# Patient Record
Sex: Female | Born: 1965
Health system: Southern US, Community
[De-identification: ages and names within clinical notes are randomized; demographics above are authoritative.]

## PROBLEM LIST (undated history)

## (undated) DIAGNOSIS — B009 Herpesviral infection, unspecified: Secondary | ICD-10-CM

## (undated) DIAGNOSIS — F1920 Other psychoactive substance dependence, uncomplicated: Secondary | ICD-10-CM

## (undated) DIAGNOSIS — K56609 Unspecified intestinal obstruction, unspecified as to partial versus complete obstruction: Secondary | ICD-10-CM

## (undated) DIAGNOSIS — I10 Essential (primary) hypertension: Secondary | ICD-10-CM

## (undated) DIAGNOSIS — J449 Chronic obstructive pulmonary disease, unspecified: Secondary | ICD-10-CM

## (undated) DIAGNOSIS — K1379 Other lesions of oral mucosa: Secondary | ICD-10-CM

## (undated) DIAGNOSIS — K219 Gastro-esophageal reflux disease without esophagitis: Secondary | ICD-10-CM

## (undated) DIAGNOSIS — E785 Hyperlipidemia, unspecified: Secondary | ICD-10-CM

## (undated) DIAGNOSIS — B1920 Unspecified viral hepatitis C without hepatic coma: Secondary | ICD-10-CM

## (undated) DIAGNOSIS — K5900 Constipation, unspecified: Secondary | ICD-10-CM

## (undated) HISTORY — DX: Other lesions of oral mucosa: K13.79

## (undated) HISTORY — DX: Unspecified viral hepatitis C without hepatic coma: B19.20

## (undated) HISTORY — DX: Other psychoactive substance dependence, uncomplicated: F19.20

## (undated) HISTORY — PX: ABDOMINAL HYSTERECTOMY: SHX81

## (undated) HISTORY — PX: HERNIA REPAIR: SHX51

## (undated) HISTORY — DX: Herpesviral infection, unspecified: B00.9

## (undated) HISTORY — DX: Hyperlipidemia, unspecified: E78.5

## (undated) HISTORY — PX: CHOLECYSTECTOMY: SHX55

---

## 2005-03-13 ENCOUNTER — Ambulatory Visit: Payer: Self-pay | Admitting: Surgery

## 2005-04-02 ENCOUNTER — Ambulatory Visit: Payer: Self-pay | Admitting: Surgery

## 2005-06-11 ENCOUNTER — Emergency Department: Payer: Self-pay | Admitting: Emergency Medicine

## 2005-06-20 ENCOUNTER — Ambulatory Visit: Payer: Self-pay | Admitting: Gastroenterology

## 2005-06-23 ENCOUNTER — Emergency Department: Payer: Self-pay | Admitting: General Practice

## 2005-08-06 ENCOUNTER — Ambulatory Visit: Payer: Self-pay | Admitting: Surgery

## 2005-09-08 ENCOUNTER — Other Ambulatory Visit: Payer: Self-pay

## 2005-09-18 ENCOUNTER — Inpatient Hospital Stay: Payer: Self-pay | Admitting: Surgery

## 2005-10-28 ENCOUNTER — Emergency Department (HOSPITAL_COMMUNITY): Admission: EM | Admit: 2005-10-28 | Discharge: 2005-10-28 | Payer: Self-pay | Admitting: Emergency Medicine

## 2005-11-11 ENCOUNTER — Emergency Department (HOSPITAL_COMMUNITY): Admission: EM | Admit: 2005-11-11 | Discharge: 2005-11-11 | Payer: Self-pay | Admitting: Family Medicine

## 2006-01-29 ENCOUNTER — Emergency Department: Payer: Self-pay | Admitting: Emergency Medicine

## 2006-04-30 ENCOUNTER — Emergency Department: Payer: Self-pay | Admitting: Emergency Medicine

## 2006-10-19 ENCOUNTER — Emergency Department: Payer: Self-pay | Admitting: Unknown Physician Specialty

## 2006-10-19 ENCOUNTER — Other Ambulatory Visit: Payer: Self-pay

## 2006-12-14 IMAGING — CR DG ANKLE COMPLETE 3+V*L*
2 series · 2 of 2 positions shown · non-contrast
Comparison: none

CLINICAL DATA: Ankle pain and swelling.  
 LEFT ANKLE ? 3 VIEW:
 There is soft tissue swelling noted.  There are no fractures or dislocations.  There is a compression plate/screw seen associated with the 1st metatarsal shaft.

[view not recorded (1 of 2)]
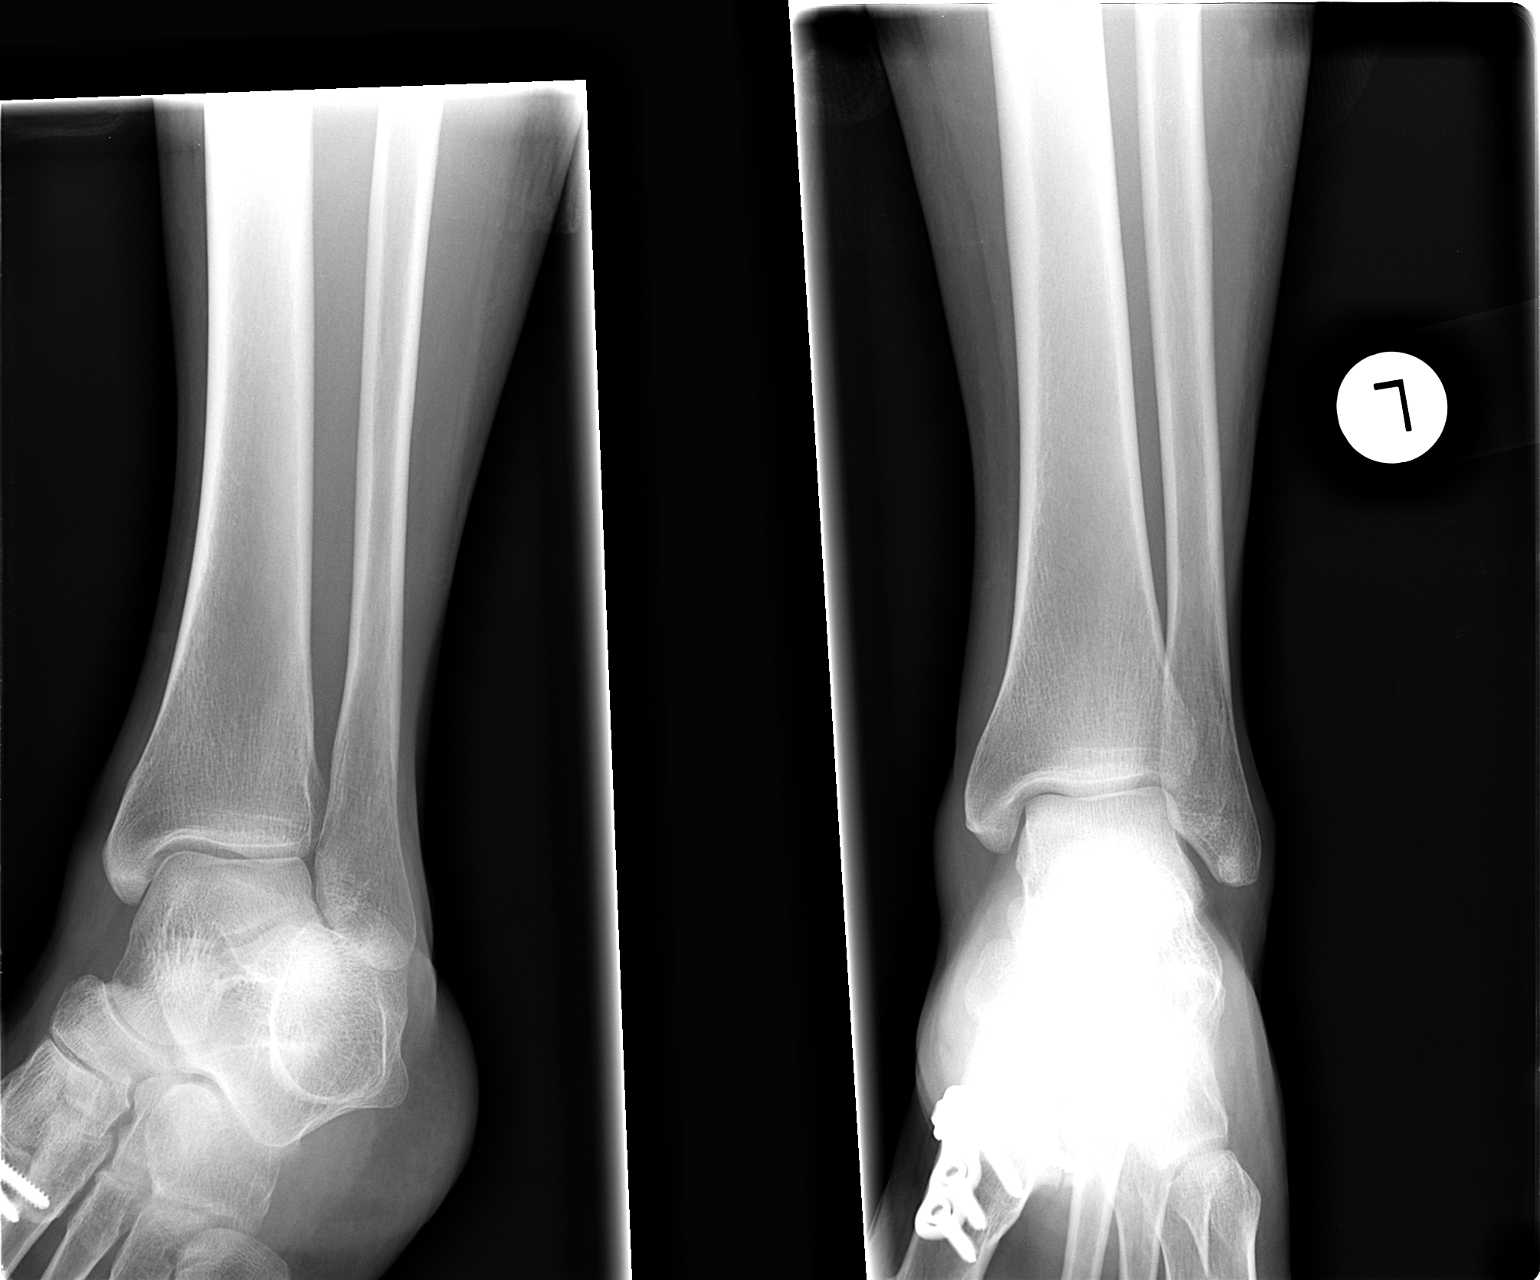

[view not recorded (2 of 2)]
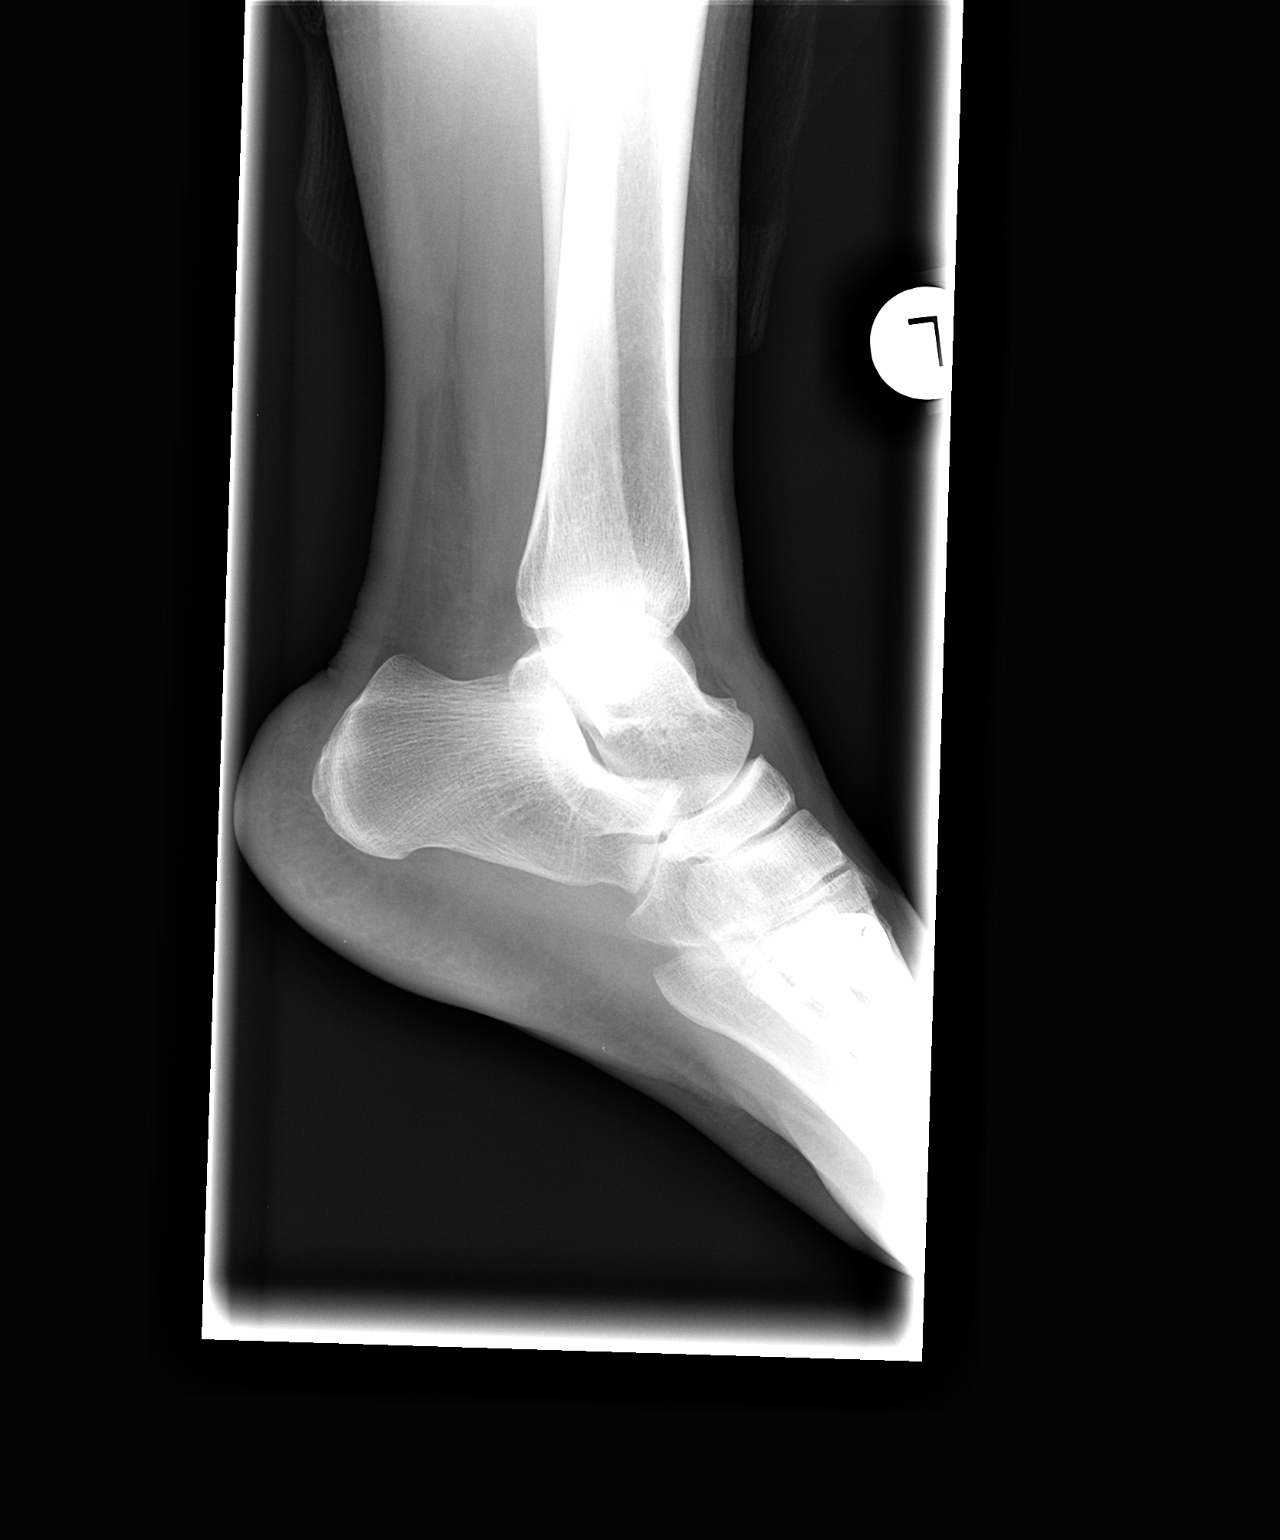

[2 of 2 positions shown; findings below may reference images not displayed]

IMPRESSION: Soft tissue swelling.  No acute findings.

## 2010-07-03 ENCOUNTER — Emergency Department: Payer: Self-pay | Admitting: Emergency Medicine

## 2011-03-25 ENCOUNTER — Inpatient Hospital Stay (INDEPENDENT_AMBULATORY_CARE_PROVIDER_SITE_OTHER)
Admission: RE | Admit: 2011-03-25 | Discharge: 2011-03-25 | Disposition: A | Discharge status: Home / Self Care | Payer: Medicare Other | Source: Ambulatory Visit | Attending: Family Medicine | Admitting: Family Medicine

## 2011-03-25 DIAGNOSIS — J45909 Unspecified asthma, uncomplicated: Secondary | ICD-10-CM

## 2011-04-16 DIAGNOSIS — E782 Mixed hyperlipidemia: Secondary | ICD-10-CM | POA: Insufficient documentation

## 2011-06-20 ENCOUNTER — Other Ambulatory Visit: Payer: Self-pay | Admitting: Nurse Practitioner

## 2011-06-20 ENCOUNTER — Ambulatory Visit
Admission: RE | Admit: 2011-06-20 | Discharge: 2011-06-20 | Disposition: A | Discharge status: Home / Self Care | Payer: Medicare Other | Source: Ambulatory Visit | Attending: Nurse Practitioner | Admitting: Nurse Practitioner

## 2011-06-20 DIAGNOSIS — Z1231 Encounter for screening mammogram for malignant neoplasm of breast: Secondary | ICD-10-CM

## 2011-07-04 ENCOUNTER — Encounter: Payer: Self-pay | Admitting: Internal Medicine

## 2011-07-07 ENCOUNTER — Encounter: Payer: Self-pay | Admitting: Internal Medicine

## 2011-07-07 ENCOUNTER — Ambulatory Visit (INDEPENDENT_AMBULATORY_CARE_PROVIDER_SITE_OTHER): Payer: Medicare Other | Admitting: Internal Medicine

## 2011-07-07 DIAGNOSIS — J45909 Unspecified asthma, uncomplicated: Secondary | ICD-10-CM

## 2011-07-07 MED ORDER — FAMOTIDINE 20 MG PO TABS: ORAL_TABLET | ORAL | Status: DC

## 2011-07-07 MED ORDER — BUDESONIDE-FORMOTEROL FUMARATE 160-4.5 MCG/ACT IN AERO: INHALATION_SPRAY | RESPIRATORY_TRACT | Status: DC

## 2011-07-07 MED ORDER — RABEPRAZOLE SODIUM 20 MG PO TBEC: DELAYED_RELEASE_TABLET | ORAL | Status: DC

## 2011-07-07 NOTE — Progress Notes (Signed)
  Subjective:    Patient ID: Brittany Sparks, female    DOB: 12/19/1965, 45 y.o.   MRN: [id number]  HPI  45 yobm quit smoking 2008 p dx of asthma by [facility] around 2000 referred 07/07/2011 to  Pulmonary clinic by [name].  07/07/2011 1st pulmonary cc cough x 12 years, daily no treatment for asthma has helped the cough including advair then abupt onset cough and sob much worse   x 3 weeks changed bp meds x 2 weeks ? Was this an ace inhibitor? - cough is dry more day than night but present 24 h per day, minimal sob, controls with saba but takes up to 10 puffs at a time before improves.   Denies any obvious fluctuation of symptoms with weather or environmental changes or other aggravating or alleviating factors except as outlined above   Review of Systems  Constitutional: Negative for fever, chills and unexpected weight change.  HENT: Negative for ear pain, nosebleeds, congestion, sore throat, rhinorrhea, sneezing, trouble swallowing, dental problem, voice change, postnasal drip and sinus pressure.   Eyes: Negative for visual disturbance.  Respiratory: Negative for cough, choking and shortness of breath.   Cardiovascular: Negative for chest pain and leg swelling.  Gastrointestinal: Negative for vomiting, abdominal pain and diarrhea.  Genitourinary: Negative for difficulty urinating.  Musculoskeletal: Negative for arthralgias.  Skin: Negative for rash.  Neurological: Negative for tremors, syncope and headaches.  Hematological: Does not bruise/bleed easily.       Objective:   Physical Exam  slt hoarse amb bf nad   Wt 149 07/07/2011   HEENT: nl dentition, turbinates, and orophanx. Nl external ear canals without cough reflex   NECK :  without JVD/Nodes/TM/ nl carotid upstrokes bilaterally   LUNGS: no acc muscle use, clear to A and P bilaterally without cough on insp or exp maneuvers   CV:  RRR  no s3 or murmur or increase in P2, no edema   ABD:  soft and nontender  with nl excursion in the supine position. No bruits or organomegaly, bowel sounds nl  MS:  warm without deformities, calf tenderness, cyanosis or clubbing  SKIN: warm and dry without lesions    NEURO:  alert, approp, no deficits    06/20/11 CXR at [facility] reported nl       Assessment & Plan:

## 2011-07-07 NOTE — Patient Instructions (Signed)
Try aciphex  20mg   Take 30-60 min before first meal of the day and Pepcid 20 mg one bedtime until cough is completely gone for at least a week without the need for cough suppression  I think of reflux for chronic cough like I do oxygen for fire (doesn't cause the fire but once you get the oxygen suppressed it usually goes away regardless of the exact cause).   symbicort 160 Take 2 puffs first thing in am and then another 2 puffs about 12 hours later.     Work on inhaler technique:  relax and gently blow all the way out then take a nice smooth deep breath back in, triggering the inhaler at same time you start breathing in.  Hold for up to 5 seconds if you can.  Rinse and gargle with water when done   If your mouth or throat starts to bother you,   I suggest you time the inhaler to your dental care and after using the inhaler(s) brush teeth and tongue with a baking soda containing toothpaste and when you rinse this out, gargle with it first to see if this helps your mouth and throat.    Only use your albuterol(ventolin, blue inhaler) as a rescue medication to be used if you can't catch your breath by resting or doing a relaxed purse lip breathing pattern. The less you use it, the better it will work when you need it.   Best cough medication is delsym   Please schedule a follow up office visit in 4 weeks, sooner if needed

## 2011-07-07 NOTE — Progress Notes (Signed)
  Subjective:    Patient ID: Brittany Sparks, female    DOB: 11-Mar-1966, 45 y.o.   MRN: [id number]  HPI    Review of Systems  Constitutional: Negative for fever, chills, diaphoresis, activity change, appetite change, fatigue and unexpected weight change.  HENT: Positive for mouth sores, postnasal drip and sinus pressure. Negative for hearing loss, ear pain, nosebleeds, congestion, sore throat, facial swelling, rhinorrhea, sneezing, trouble swallowing, neck pain, neck stiffness, dental problem, voice change, tinnitus and ear discharge.   Eyes: Negative for photophobia, discharge, itching and visual disturbance.  Respiratory: Positive for cough, shortness of breath and wheezing. Negative for apnea, choking, chest tightness and stridor.   Cardiovascular: Negative for chest pain, palpitations and leg swelling.  Gastrointestinal: Negative for nausea, vomiting, abdominal pain, constipation, blood in stool and abdominal distention.  Genitourinary: Negative for dysuria, urgency, frequency, hematuria, flank pain, decreased urine volume and difficulty urinating.  Musculoskeletal: Negative for myalgias, back pain, joint swelling, arthralgias and gait problem.  Skin: Negative for color change, pallor and rash.  Neurological: Positive for dizziness, weakness and light-headedness. Negative for tremors, seizures, syncope, speech difficulty, numbness and headaches.  Hematological: Negative for adenopathy. Does not bruise/bleed easily.  Psychiatric/Behavioral: Positive for sleep disturbance. Negative for confusion and agitation. The patient is not nervous/anxious.        Objective:   Physical Exam        Assessment & Plan:

## 2011-07-08 DIAGNOSIS — J449 Chronic obstructive pulmonary disease, unspecified: Secondary | ICD-10-CM | POA: Insufficient documentation

## 2011-07-08 NOTE — Assessment & Plan Note (Signed)
DDX of  difficult airways managment all start with A and  include Adherence, Ace Inhibitors, Acid Reflux, Active Sinus Disease, Alpha 1 Antitripsin deficiency, Anxiety masquerading as Airways dz,  ABPA,  allergy(esp in young), Aspiration (esp in elderly), Adverse effects of DPI,  Active smokers, plus two Bs  = Bronchiectasis and Beta blocker use..and one C= CHF  In this case Adherence is the biggest issue and starts with  inability to use HFA effectively and also  understand that SABA treats the symptoms but doesn't get to the underlying problem (inflammation).  I used  the analogy of putting steroid cream on a rash to help explain the meaning of topical therapy and the need to get the drug to the target tissue.  The proper method of use, as well as anticipated side effects, of this metered-dose inhaler are discussed and demonstrated to the patient. Improved to 75% p extensive teaching so start symbicort 160 2bid  ?Adverse effect of advair > d/c  ? Acid reflux > diet reviewed

## 2011-08-11 ENCOUNTER — Encounter: Payer: Self-pay | Admitting: Internal Medicine

## 2011-08-11 ENCOUNTER — Ambulatory Visit (INDEPENDENT_AMBULATORY_CARE_PROVIDER_SITE_OTHER): Payer: Medicare Other | Admitting: Internal Medicine

## 2011-08-11 DIAGNOSIS — J45909 Unspecified asthma, uncomplicated: Secondary | ICD-10-CM

## 2011-08-11 MED ORDER — PREDNISONE (PAK) 10 MG PO TABS: ORAL_TABLET | ORAL | Status: AC

## 2011-08-11 MED ORDER — AMOXICILLIN-POT CLAVULANATE 875-125 MG PO TABS: 1.0000 | ORAL_TABLET | Freq: Two times a day (BID) | ORAL | Status: AC

## 2011-08-11 MED ORDER — AMLODIPINE-OLMESARTAN 5-20 MG PO TABS: 1.0000 | ORAL_TABLET | Freq: Every day | ORAL | Status: DC

## 2011-08-11 NOTE — Patient Instructions (Addendum)
Stop norvasc and cozar Start Azor 5/20 one now and each am Prednisone 10 mg take  4 each am x 2 days,   2 each am x 2 days,  1 each am x2days and stop  Augmentin twice daily with meals and a large glass of water and cultured yogurt for lunch   Only use your albuterol (ventolin)  as a rescue medication to be used if you can't catch your breath by resting or doing a relaxed purse lip breathing pattern. The less you use it, the better it will work when you need it.    See [name] NP w/in 2 weeks with all your medications, even over the counter meds, separated in two separate bags, the ones you take no matter what vs the ones you stop once you feel better and take only as needed when you feel you need them.   [name]  will generate for you a new user friendly medication calendar that will put [place] all on the same page re: your medication use.   BRING YOUR FORMULARY    Without this process, it simply isn't possible to assure that we are providing  your outpatient care  with  the attention to detail we feel you deserve.   If we cannot assure that you're getting that kind of care,  then we cannot manage your problem effectively from this clinic.  Once you have seen [name] and we are sure that we're all on the same page with your medication use she will arrange follow up with me.

## 2011-08-11 NOTE — Progress Notes (Signed)
  Subjective:    Patient ID: Brittany Sparks, female    DOB: 12/17/1965  MRN: [id number]  HPI  Brief patient profile:  45 yobm quit smoking 2008 p dx of asthma by Brittany Sparks around 2000 referred 07/07/2011 to  [facility] by Brittany [facility].  07/07/2011 1st [facility] cc cough x 12 years, daily no treatment for asthma has helped the cough including advair then abupt onset cough and sob much worse   x 3 weeks changed bp meds x 2 weeks ? Was this an ace inhibitor? - cough is dry more day than night but present 24 h per day, minimal sob, controls with saba but takes up to 10 puffs at a time before improves. rec Try aciphex  20mg   Take 30-60 min before first meal of the day and Pepcid 20 mg one bedtime  Symbicort 160 Take 2 puffs first thing in am and then another 2 puffs about 12 hours later.  Work on inhaler technique:  Only use your albuterol(ventolin, blue inhaler) as a rescue medication .  Best cough medication is delsym   08/11/2011 f/u ov/Brittany Sparks on prevacid 30 mg and pepcid one at bedtime Cough improved but worsened again x 1 wk prior to OV   Shortness of Breath improved. Still using ventolin day but not typically at night.  Inconsistent with meds, unsure of names, does not really understand maint vs prns and stops them when she feels better or rx runs out.   Sleeping ok without nocturnal  or early am exacerbation  of respiratory  c/o's or need for noct saba. Also denies any obvious fluctuation of symptoms with weather or environmental changes or other aggravating or alleviating factors except as outlined above   ROS  At present neg for  any significant sore throat, dysphagia, itching, sneezing,  nasal congestion or excess/ purulent secretions,  fever, chills, sweats, unintended wt loss, pleuritic or exertional cp, hempoptysis, orthopnea pnd or leg swelling.  Also denies presyncope, palpitations, heartburn, abdominal pain, nausea, vomiting, diarrhea  or change in bowel or urinary habits,  dysuria,hematuria,  rash, arthralgias, visual complaints, headache, numbness weakness or ataxia.          Objective:   Physical Exam  slt hoarse amb bf nad   Wt 149 07/07/2011 > 08/11/2011  153  HEENT: nl dentition, turbinates, and orophanx. Nl external ear canals without cough reflex   NECK :  without JVD/Nodes/TM/ nl carotid upstrokes bilaterally   LUNGS: no acc muscle use, clear to A and P bilaterally without cough on insp or exp maneuvers   CV:  RRR  no s3 or murmur or increase in P2, no edema   ABD:  soft and nontender with nl excursion in the supine position. No bruits or organomegaly, bowel sounds nl  MS:  warm without deformities, calf tenderness, cyanosis or clubbing     06/20/11 CXR at [facility] reported nl       Assessment & Plan:

## 2011-08-12 NOTE — Assessment & Plan Note (Signed)
Symptoms are markedly disproportionate to objective findings and not clear this is all a  lung problem but pt does appear to have difficult airway management issues.   Adherence is always the initial "prime suspect" and is a multilayered concern that requires a "trust but verify" approach in every patient - starting with knowing how to use medications, especially inhalers, correctly, keeping up with refills and understanding the fundamental difference between maintenance and prns vs those medications only taken for a very short course and then stopped and not refilled. The proper method of use, as well as anticipated side effects, of this metered-dose inhaler are discussed and demonstrated to the patient. Improved to 100% with repeated teaching attempts  ? Acid reflux > reviewed rx/ diet  ? Sinus dz > augmentin/ pred cycle then sinus ct if not better  ? Surreptitious acei use > desperately needs full and accurate med reconciliatoin    Each maintenance medication was reviewed in detail including most importantly the difference between maintenance and as needed and under what circumstances the prns are to be used.  Please see instructions for details which were reviewed in writing and the patient given a copy.

## 2011-08-25 ENCOUNTER — Encounter: Payer: Self-pay | Admitting: Adult Health

## 2011-08-25 ENCOUNTER — Ambulatory Visit (INDEPENDENT_AMBULATORY_CARE_PROVIDER_SITE_OTHER): Payer: Medicare Other | Admitting: Adult Health

## 2011-08-25 DIAGNOSIS — J45909 Unspecified asthma, uncomplicated: Secondary | ICD-10-CM

## 2011-08-25 MED ORDER — BUDESONIDE-FORMOTEROL FUMARATE 160-4.5 MCG/ACT IN AERO: INHALATION_SPRAY | RESPIRATORY_TRACT | Status: DC

## 2011-08-25 MED ORDER — AMLODIPINE-OLMESARTAN 5-20 MG PO TABS: 1.0000 | ORAL_TABLET | Freq: Every day | ORAL | Status: DC

## 2011-08-25 MED ORDER — SIMVASTATIN 10 MG PO TABS: 10.0000 mg | ORAL_TABLET | Freq: Every day | ORAL | Status: DC

## 2011-08-25 MED ORDER — ALBUTEROL SULFATE HFA 108 (90 BASE) MCG/ACT IN AERS: 2.0000 | INHALATION_SPRAY | RESPIRATORY_TRACT | Status: DC | PRN

## 2011-08-25 MED ORDER — FLUTICASONE PROPIONATE 50 MCG/ACT NA SUSP: 2.0000 | Freq: Two times a day (BID) | NASAL | Status: DC | PRN

## 2011-08-25 MED ORDER — BUTALBITAL-APAP-CAFFEINE 50-325-40 MG PO TABS: 1.0000 | ORAL_TABLET | Freq: Four times a day (QID) | ORAL | Status: DC | PRN

## 2011-08-25 MED ORDER — OMEPRAZOLE 40 MG PO CPDR: DELAYED_RELEASE_CAPSULE | ORAL | Status: DC

## 2011-08-25 NOTE — Assessment & Plan Note (Addendum)
Improved control  Patient's medications were reviewed today and patient education was given. Computerized medication calendar was adjusted/completed  Plan:  Cont on current regimen.  follow up Dr. Sherene Sires  In 1 month and As needed

## 2011-08-25 NOTE — Patient Instructions (Signed)
Stop Goody powder/meds  Follow med calendar closely and bring to each visit.  Brush/rinse and gargle after inhaler use  follow up Dr. Sherene Sires  In 1 month and As needed

## 2011-08-25 NOTE — Progress Notes (Signed)
Patient ID: Brittany Sparks, female   DOB: 01/25/1966, 46 y.o.   MRN: [id number]  Subjective:    Patient ID: Brittany Sparks, female    DOB: 1966-02-26  MRN: [id number]  HPI  Brief patient profile:  45 yobm quit smoking 2008 p dx of asthma by [facility] around 2000 referred 07/07/2011 to  Pulmonary clinic by [name].  07/07/2011 1st pulmonary cc cough x 12 years, daily no treatment for asthma has helped the cough including advair then abupt onset cough and sob much worse   x 3 weeks changed bp meds x 2 weeks ? Was this an ace inhibitor? - cough is dry more day than night but present 24 h per day, minimal sob, controls with saba but takes up to 10 puffs at a time before improves. rec Try aciphex  20mg   Take 30-60 min before first meal of the day and Pepcid 20 mg one bedtime  Symbicort 160 Take 2 puffs first thing in am and then another 2 puffs about 12 hours later.  Work on inhaler technique:  Only use your albuterol(ventolin, blue inhaler) as a rescue medication .  Best cough medication is delsym   08/11/2011 f/u ov/Wert on prevacid 30 mg and pepcid one at bedtime Cough improved but worsened again x 1 wk prior to OV   Shortness of Breath improved. Still using ventolin day but not typically at night.  Inconsistent with meds, unsure of names, does not really understand maint vs prns and stops them when she feels better or rx runs out.  >>prednisone taper , changed to AZOR from cozaar/norvasc ,augmentin r x  08/25/2011 Follow up and med review  Pt returns for follow up and med review. Since last ov feeling much better.  We reviewed all her meds and organized them into a med calendar  W/ pt education  It appears she is taking meds correctly.  No chest pain or edema. Dyspnea is less.            Objective:   Physical Exam  slt hoarse amb bf nad   Wt 149 07/07/2011 > 08/11/2011  153>>151 08/25/2011  HEENT: nl dentition, turbinates, and orophanx. Nl external ear canals without cough  reflex   NECK :  without JVD/Nodes/TM/ nl carotid upstrokes bilaterally   LUNGS: no acc muscle use, clear to A and P bilaterally without cough on insp or exp maneuvers   CV:  RRR  no s3 or murmur or increase in P2, no edema   ABD:  soft and nontender with nl excursion in the supine position. No bruits or organomegaly, bowel sounds nl  MS:  warm without deformities, calf tenderness, cyanosis or clubbing     06/20/11 CXR at [facility] reported nl       Assessment & Plan:

## 2011-09-22 ENCOUNTER — Encounter: Payer: Medicare Other | Admitting: Internal Medicine

## 2011-09-22 NOTE — Progress Notes (Signed)
 This encounter was created in error - please disregard.

## 2011-10-10 ENCOUNTER — Ambulatory Visit (INDEPENDENT_AMBULATORY_CARE_PROVIDER_SITE_OTHER): Payer: Medicare Other | Admitting: Internal Medicine

## 2011-10-10 ENCOUNTER — Encounter: Payer: Self-pay | Admitting: Internal Medicine

## 2011-10-10 DIAGNOSIS — J45909 Unspecified asthma, uncomplicated: Secondary | ICD-10-CM

## 2011-10-10 NOTE — Patient Instructions (Signed)
Try substituting the dulera 200 Take 2 puffs first thing in am and then another 2 puffs about 12 hours later.   Dexilant 60 Take 30-60 min before first meal of the day while waiting to sort out the omeprazole  Issue  Please schedule a follow up office visit in 4 weeks, sooner if needed

## 2011-10-10 NOTE — Progress Notes (Signed)
Patient ID: Brittany Sparks, female   DOB: 1965/09/15, 46 y.o.   MRN: [id number]  Subjective:    Patient ID: Brittany Sparks, female    DOB: 01-06-1966  MRN: [id number]  HPI  Brief patient profile:  45 yobm quit smoking 2008 p dx of asthma by [facility] around 2000 referred 07/07/2011 to  Pulmonary clinic by [name].  07/07/2011 1st pulmonary cc cough x 12 years, daily no treatment for asthma has helped the cough including advair then abupt onset cough and sob much worse   x 3 weeks changed bp meds x 2 weeks ? Was this an ace inhibitor? - cough is dry more day than night but present 24 h per day, minimal sob, controls with saba but takes up to 10 puffs at a time before improves. rec Try aciphex  20mg   Take 30-60 min before first meal of the day and Pepcid 20 mg one bedtime  Symbicort 160 Take 2 puffs first thing in am and then another 2 puffs about 12 hours later.  Work on inhaler technique:  Only use your albuterol(ventolin, blue inhaler) as a rescue medication .  Best cough medication is delsym   08/11/2011 f/u ov/Janete Quilling on prevacid 30 mg and pepcid one at bedtime Cough improved but worsened again x 1 wk prior to OV   Shortness of Breath improved. Still using ventolin day but not typically at night.  Inconsistent with meds, unsure of names, does not really understand maint vs prns and stops them when she feels better or rx runs out.  >>prednisone taper , changed to AZOR from cozaar/norvasc ,augmentin rx  08/25/2011 Follow up and med review  Pt returns for follow up and med review. Since last ov feeling much better.  We reviewed all her meds and organized them into a med calendar  W/ pt education  It appears she is taking meds correctly.  No chest pain or edema. Dyspnea is less.  rec Stop Goody powder/meds  Follow med calendar closely and bring to each visit.  Brush/rinse and gargle after inhaler use  10/10/2011 f/u ov/Ijeoma Loor cc overall better but still using ventolin 3-4 x week esp  with activity due to sob, no purulent sputum or over sinus or reflux symptoms  Sleeping ok without nocturnal  or early am exacerbation  of respiratory  c/o's or need for noct saba. Also denies any obvious fluctuation of symptoms with weather or environmental changes or other aggravating or alleviating factors except as outlined above   ROS  At present neg for  any significant sore throat, dysphagia, dental problems, itching, sneezing,  nasal congestion or excess/ purulent secretions, ear ache,   fever, chills, sweats, unintended wt loss, pleuritic or exertional cp, hemoptysis, palpitations, orthopnea pnd or leg swelling.  Also denies presyncope, palpitations, heartburn, abdominal pain, anorexia, nausea, vomiting, diarrhea  or change in bowel or urinary habits, change in stools or urine, dysuria,hematuria,  rash, arthralgias, visual complaints, headache, numbness weakness or ataxia or problems with walking or coordination. No noted change in mood/affect or memory.                            Objective:   Physical Exam  slt hoarse amb bf nad   Wt 149 07/07/2011 > 08/11/2011  153>>151 08/25/2011 >  148 10/10/2011    HEENT: nl dentition, turbinates, and orophanx. Nl external ear canals without cough reflex   NECK :  without JVD/Nodes/TM/ nl carotid upstrokes  bilaterally   LUNGS: no acc muscle use, clear to A and P bilaterally without cough on insp or exp maneuvers   CV:  RRR  no s3 or murmur or increase in P2, no edema   ABD:  soft and nontender with nl excursion in the supine position. No bruits or organomegaly, bowel sounds nl  MS:  warm without deformities, calf tenderness, cyanosis or clubbing     06/20/11 CXR at [facility] reported nl       Assessment & Plan:

## 2011-10-11 NOTE — Assessment & Plan Note (Signed)
-   hfa improved to 75% 07/07/11 > 100% 08/11/2011  -med calendar 08/25/2011   Still using excessive B2 but overally much better symptom control with use of hfa laba/ics and gerd rx  Try next change to dulera 200 2bid and dexilant samples to max gerd rx      Each maintenance medication was reviewed in detail including most importantly the difference between maintenance and as needed and under what circumstances the prns are to be used. This was done in the context of a medication calendar review which provided the patient with a user-friendly unambiguous mechanism for medication administration and reconciliation and provides an action plan for all active problems. It is critical that this be shown to every doctor  for modification during the office visit if necessary so the patient can use it as a working document.

## 2011-10-22 ENCOUNTER — Telehealth: Payer: Self-pay | Admitting: Internal Medicine

## 2011-10-22 NOTE — Telephone Encounter (Signed)
[id number] to verify pharmacy. Carron Curie, CMA

## 2011-10-22 NOTE — Telephone Encounter (Signed)
Ok to send rx

## 2011-10-22 NOTE — Telephone Encounter (Signed)
MW, please advise if okay to send RX; was seen 10-10-2011 and was told---Dexilant 60 Take 30-60 min before first meal of the day while waiting to sort out the omeprazole Issue

## 2011-10-23 MED ORDER — DEXLANSOPRAZOLE 60 MG PO CPDR: 60.0000 mg | DELAYED_RELEASE_CAPSULE | Freq: Every day | ORAL | Status: DC

## 2011-10-23 NOTE — Telephone Encounter (Signed)
LMOMTCB x 1 

## 2011-10-23 NOTE — Telephone Encounter (Signed)
RX sent to the pharmacy and detailed msg left on pt's VM so she is aware.

## 2011-10-23 NOTE — Telephone Encounter (Signed)
Pt returned triage's call.  The pharmacy the pt would like the Rx sent to is [facility] on [address] in [place].  Brittany Sparks

## 2011-11-07 ENCOUNTER — Encounter: Payer: Self-pay | Admitting: Internal Medicine

## 2011-11-07 ENCOUNTER — Ambulatory Visit (INDEPENDENT_AMBULATORY_CARE_PROVIDER_SITE_OTHER): Payer: Medicare Other | Admitting: Internal Medicine

## 2011-11-07 DIAGNOSIS — J45909 Unspecified asthma, uncomplicated: Secondary | ICD-10-CM

## 2011-11-07 DIAGNOSIS — I10 Essential (primary) hypertension: Secondary | ICD-10-CM

## 2011-11-07 DIAGNOSIS — J310 Chronic rhinitis: Secondary | ICD-10-CM | POA: Insufficient documentation

## 2011-11-07 MED ORDER — AMLODIPINE-OLMESARTAN 5-20 MG PO TABS: 1.0000 | ORAL_TABLET | Freq: Every day | ORAL | Status: DC

## 2011-11-07 MED ORDER — MOMETASONE FURO-FORMOTEROL FUM 200-5 MCG/ACT IN AERO: 2.0000 | INHALATION_SPRAY | Freq: Two times a day (BID) | RESPIRATORY_TRACT | Status: DC

## 2011-11-07 MED ORDER — FLUTICASONE PROPIONATE 50 MCG/ACT NA SUSP: 1.0000 | Freq: Every day | NASAL | Status: DC

## 2011-11-07 NOTE — Progress Notes (Signed)
Subjective:    Patient ID: Brittany Sparks, female    DOB: 1966/05/03  MRN: [id number]  HPI  Brief patient profile:  45 yobm quit smoking 2008 p dx of asthma by [facility] around 2000 referred 07/07/2011 to  Pulmonary clinic by Brittany Sparks.  07/07/2011 1st pulmonary cc cough x 12 years, daily no treatment for asthma has helped the cough including advair then abupt onset cough and sob much worse   x 3 weeks changed bp meds x 2 weeks ? Was this an ace inhibitor? - cough is dry more day than night but present 24 h per day, minimal sob, controls with saba but takes up to 10 puffs at a time before improves. rec Try aciphex  20mg   Take 30-60 min before first meal of the day and Pepcid 20 mg one bedtime  Symbicort 160 Take 2 puffs first thing in am and then another 2 puffs about 12 hours later.  Work on inhaler technique:  Only use your albuterol(ventolin, blue inhaler) as a rescue medication .  Best cough medication is delsym   08/11/2011 f/u ov/Brittany Sparks on prevacid 30 mg and pepcid one at bedtime Cough improved but worsened again x 1 wk prior to OV   Shortness of Breath improved. Still using ventolin day but not typically at night.  Inconsistent with meds, unsure of names, does not really understand maint vs prns and stops them when she feels better or rx runs out.  >>prednisone taper , changed to AZOR from cozaar/norvasc ,augmentin rx  08/25/2011 Follow up and med review  Pt returns for follow up and med review. Since last ov feeling much better.  We reviewed all her meds and organized them into a med calendar  W/ pt education  It appears she is taking meds correctly.  No chest pain or edema. Dyspnea is less.  rec Stop Goody powder/meds  Follow med calendar closely and bring to each visit.  Brush/rinse and gargle after inhaler use  10/10/2011 f/u ov/Brittany Sparks cc overall better but still using ventolin 3-4 x week esp with activity due to sob, no purulent sputum or over sinus or reflux  symptoms. rec Try substituting the dulera 200 Take 2 puffs first thing in am and then another 2 puffs about 12 hours later.  Dexilant 60 Take 30-60 min before first meal of the day while waiting to sort out the omeprazole  Issue   11/07/2011 f/u ov/Brittany Sparks cc only cough in am attributed to sinus drainage and heavy perfume, better when takes flonase first thing in am, no excess mucus day or night nor any discolored secretions, no variability or over hb symptoms.  Sleeping ok without nocturnal  or early am exacerbation  of respiratory  c/o's or need for noct saba. Also denies any obvious fluctuation of symptoms with weather or environmental changes or other aggravating or alleviating factors except as outlined above   ROS  At present neg for  any significant sore throat, dysphagia, dental problems, itching, sneezing,     ear ache,   fever, chills, sweats, unintended wt loss, pleuritic or exertional cp, hemoptysis, palpitations, orthopnea pnd or leg swelling.  Also denies presyncope, palpitations, heartburn, abdominal pain, anorexia, nausea, vomiting, diarrhea  or change in bowel or urinary habits, change in stools or urine, dysuria,hematuria,  rash, arthralgias, visual complaints, headache, numbness weakness or ataxia or problems with walking or coordination. No noted change in mood/affect or memory.  Objective:   Physical Exam  slt hoarse amb bf nad   Wt 149 07/07/2011 > 08/11/2011  153>>151 08/25/2011 >  148 10/10/2011  > 11/07/2011  146   HEENT: nl dentition, turbinates, and orophanx. Nl external ear canals without cough reflex   NECK :  without JVD/Nodes/TM/ nl carotid upstrokes bilaterally   LUNGS: no acc muscle use, clear to A and P bilaterally without cough on insp or exp maneuvers   CV:  RRR  no s3 or murmur or increase in P2, no edema   ABD:  soft and nontender with nl excursion in the supine position. No bruits or organomegaly, bowel sounds nl  MS:   warm without deformities, calf tenderness, cyanosis or clubbing     06/20/11 CXR at [facility] reported nl       Assessment & Plan:

## 2011-11-07 NOTE — Patient Instructions (Signed)
Minimize your perfume use if possible  Change the way you take your flonase and use it automatically at bedtime and as needed during the day as per calendar  Please schedule a follow up visit in 3 months but call sooner if needed

## 2011-11-07 NOTE — Assessment & Plan Note (Signed)
Adequate control on present rx, reviewed need to remain off acei here

## 2011-11-07 NOTE — Assessment & Plan Note (Signed)
rec adding dose of flonase at hs and avoid perfume.

## 2011-11-07 NOTE — Assessment & Plan Note (Addendum)
All goals of chronic asthma control met(for the first time in years per pt)  including optimal function and elimination of symptoms with minimal need for rescue therapy.  Contingencies discussed in full including contacting this office immediately if not controlling the symptoms using the rule of two's.     See instructions for specific recommendations which were reviewed directly with the patient who was given a copy with highlighter outlining the key components.

## 2011-12-08 ENCOUNTER — Telehealth: Payer: Self-pay | Admitting: Internal Medicine

## 2011-12-08 NOTE — Telephone Encounter (Signed)
lmtcb

## 2011-12-09 NOTE — Telephone Encounter (Signed)
lmtcb

## 2011-12-10 NOTE — Telephone Encounter (Signed)
LMTCB

## 2011-12-11 NOTE — Telephone Encounter (Signed)
LMTCB and will close encounter per triage protocol  

## 2012-02-10 ENCOUNTER — Telehealth: Payer: Self-pay | Admitting: Internal Medicine

## 2012-02-10 NOTE — Telephone Encounter (Signed)
Called pt to schedule follow up apt x3.Sent letter 02/02/12. ° °

## 2012-02-18 ENCOUNTER — Other Ambulatory Visit: Payer: Self-pay | Admitting: Internal Medicine

## 2012-02-22 ENCOUNTER — Emergency Department (HOSPITAL_COMMUNITY): Payer: Medicare Other

## 2012-02-22 ENCOUNTER — Observation Stay (HOSPITAL_COMMUNITY)
Admission: EM | Admit: 2012-02-22 | Discharge: 2012-02-24 | Disposition: A | Discharge status: Home / Self Care | Payer: Medicare Other | Attending: Internal Medicine | Admitting: Internal Medicine

## 2012-02-22 ENCOUNTER — Encounter (HOSPITAL_COMMUNITY): Payer: Self-pay | Admitting: Emergency Medicine

## 2012-02-22 DIAGNOSIS — J449 Chronic obstructive pulmonary disease, unspecified: Secondary | ICD-10-CM | POA: Diagnosis present

## 2012-02-22 DIAGNOSIS — Z79899 Other long term (current) drug therapy: Secondary | ICD-10-CM | POA: Insufficient documentation

## 2012-02-22 DIAGNOSIS — K56609 Unspecified intestinal obstruction, unspecified as to partial versus complete obstruction: Principal | ICD-10-CM

## 2012-02-22 DIAGNOSIS — E785 Hyperlipidemia, unspecified: Secondary | ICD-10-CM

## 2012-02-22 DIAGNOSIS — K56600 Partial intestinal obstruction, unspecified as to cause: Secondary | ICD-10-CM

## 2012-02-22 DIAGNOSIS — J45909 Unspecified asthma, uncomplicated: Secondary | ICD-10-CM

## 2012-02-22 DIAGNOSIS — I10 Essential (primary) hypertension: Secondary | ICD-10-CM

## 2012-02-22 HISTORY — DX: Essential (primary) hypertension: I10

## 2012-02-22 LAB — URINALYSIS, ROUTINE W REFLEX MICROSCOPIC
Glucose, UA: NEGATIVE mg/dL
Hgb urine dipstick: NEGATIVE
Nitrite: NEGATIVE
pH: 5.5 (ref 5.0–8.0)

## 2012-02-22 LAB — COMPREHENSIVE METABOLIC PANEL
Albumin: 3.8 g/dL (ref 3.5–5.2)
Albumin: 3.8 g/dL (ref 3.5–5.2)
Alkaline Phosphatase: 70 U/L (ref 39–117)
Alkaline Phosphatase: 77 U/L (ref 39–117)
BUN: 11 mg/dL (ref 6–23)
BUN: 8 mg/dL (ref 6–23)
CO2: 27 mEq/L (ref 19–32)
Chloride: 102 mEq/L (ref 96–112)
Chloride: 102 mEq/L (ref 96–112)
Creatinine, Ser: 0.65 mg/dL (ref 0.50–1.10)
GFR calc non Af Amer: >90 mL/min (ref >90)
Glucose, Bld: 89 mg/dL (ref 70–99)
Glucose, Bld: 83 mg/dL (ref 70–99)
Potassium: 4.3 mEq/L (ref 3.5–5.1)
Total Bilirubin: 0.4 mg/dL (ref 0.3–1.2)
Total Bilirubin: 0.4 mg/dL (ref 0.3–1.2)

## 2012-02-22 LAB — CBC
HCT: 36.5 % (ref 36.0–46.0)
Hemoglobin: 13 g/dL (ref 12.0–15.0)
MCH: 31.3 pg (ref 26.0–34.0)
MCHC: 33.4 g/dL (ref 30.0–36.0)
MCHC: 33 g/dL (ref 30.0–36.0)
MCV: 93.8 fL (ref 78.0–100.0)
MCV: 94.9 fL (ref 78.0–100.0)
Platelets: 315 10*3/uL (ref 150–400)
RBC: 3.89 MIL/uL (ref 3.87–5.11)

## 2012-02-22 LAB — DIFFERENTIAL
Basophils Relative: 0 % (ref 0–1)
Eosinophils Absolute: 0 10*3/uL (ref 0.0–0.7)
Eosinophils Relative: 0 % (ref 0–5)
Monocytes Absolute: 0.4 10*3/uL (ref 0.1–1.0)
Monocytes Relative: 7 % (ref 3–12)
Neutrophils Relative %: 43 % (ref 43–77)

## 2012-02-22 LAB — URINE MICROSCOPIC-ADD ON

## 2012-02-22 LAB — PROTIME-INR: Prothrombin Time: 13.1 seconds (ref 11.6–15.2)

## 2012-02-22 LAB — MAGNESIUM: Magnesium: 2 mg/dL (ref 1.5–2.5)

## 2012-02-22 MED ORDER — DICYCLOMINE HCL 10 MG PO CAPS
10.0000 mg | ORAL_CAPSULE | Freq: Once | ORAL | Status: AC
  Administered 2012-02-22: 10 mg via ORAL
  Filled 2012-02-22: qty 1

## 2012-02-22 MED ORDER — SENNOSIDES-DOCUSATE SODIUM 8.6-50 MG PO TABS
1.0000 | ORAL_TABLET | Freq: Every evening | ORAL | Status: DC | PRN
  Filled 2012-02-22: qty 1

## 2012-02-22 MED ORDER — SODIUM CHLORIDE 0.9 % IV SOLN
INTRAVENOUS | Status: DC
  Administered 2012-02-22 – 2012-02-24 (×4): via INTRAVENOUS

## 2012-02-22 MED ORDER — SIMVASTATIN 10 MG PO TABS
10.0000 mg | ORAL_TABLET | Freq: Every day | ORAL | Status: DC
  Filled 2012-02-22: qty 1

## 2012-02-22 MED ORDER — ONDANSETRON 8 MG PO TBDP
8.0000 mg | ORAL_TABLET | Freq: Once | ORAL | Status: AC
  Administered 2012-02-22: 8 mg via ORAL
  Filled 2012-02-22: qty 1

## 2012-02-22 MED ORDER — FAMOTIDINE 20 MG PO TABS
20.0000 mg | ORAL_TABLET | Freq: Every day | ORAL | Status: DC
  Administered 2012-02-22 – 2012-02-23 (×2): 20 mg via ORAL
  Filled 2012-02-22 (×3): qty 1

## 2012-02-22 MED ORDER — IRBESARTAN 150 MG PO TABS
150.0000 mg | ORAL_TABLET | Freq: Every day | ORAL | Status: DC
  Filled 2012-02-22: qty 1

## 2012-02-22 MED ORDER — ONDANSETRON HCL 4 MG/2ML IJ SOLN: 4.0000 mg | Freq: Four times a day (QID) | INTRAMUSCULAR | Status: DC | PRN

## 2012-02-22 MED ORDER — SODIUM CHLORIDE 0.9 % IV SOLN
INTRAVENOUS | Status: AC
  Administered 2012-02-22: 17:00:00 via INTRAVENOUS

## 2012-02-22 MED ORDER — ONDANSETRON HCL 4 MG PO TABS: 4.0000 mg | ORAL_TABLET | Freq: Four times a day (QID) | ORAL | Status: DC | PRN

## 2012-02-22 MED ORDER — MORPHINE SULFATE 4 MG/ML IJ SOLN
4.0000 mg | INTRAMUSCULAR | Status: DC | PRN
  Administered 2012-02-22: 4 mg via INTRAVENOUS
  Filled 2012-02-22: qty 1

## 2012-02-22 MED ORDER — MORPHINE SULFATE 2 MG/ML IJ SOLN
1.0000 mg | INTRAMUSCULAR | Status: DC | PRN
  Administered 2012-02-22 – 2012-02-23 (×4): 1 mg via INTRAVENOUS
  Filled 2012-02-22: qty 2
  Filled 2012-02-22 (×4): qty 1

## 2012-02-22 MED ORDER — MOMETASONE FURO-FORMOTEROL FUM 200-5 MCG/ACT IN AERO
2.0000 | INHALATION_SPRAY | Freq: Two times a day (BID) | RESPIRATORY_TRACT | Status: DC
  Filled 2012-02-22 (×17): qty 0.3

## 2012-02-22 MED ORDER — MOMETASONE FURO-FORMOTEROL FUM 200-5 MCG/ACT IN AERO
2.0000 | INHALATION_SPRAY | Freq: Two times a day (BID) | RESPIRATORY_TRACT | Status: DC
  Filled 2012-02-22 (×17): qty 1

## 2012-02-22 MED ORDER — VALACYCLOVIR HCL 500 MG PO TABS
1000.0000 mg | ORAL_TABLET | Freq: Two times a day (BID) | ORAL | Status: DC
  Administered 2012-02-22 – 2012-02-24 (×4): 1000 mg via ORAL
  Filled 2012-02-22 (×6): qty 2

## 2012-02-22 MED ORDER — BISACODYL 10 MG RE SUPP: 10.0000 mg | Freq: Every day | RECTAL | Status: DC | PRN

## 2012-02-22 MED ORDER — AMLODIPINE-OLMESARTAN 5-20 MG PO TABS: 1.0000 | ORAL_TABLET | Freq: Every day | ORAL | Status: DC

## 2012-02-22 MED ORDER — IRBESARTAN 150 MG PO TABS
150.0000 mg | ORAL_TABLET | Freq: Every day | ORAL | Status: DC
  Administered 2012-02-23 – 2012-02-24 (×2): 150 mg
  Filled 2012-02-22 (×2): qty 1

## 2012-02-22 MED ORDER — MECLIZINE HCL 25 MG PO TABS
25.0000 mg | ORAL_TABLET | Freq: Once | ORAL | Status: AC
  Administered 2012-02-22: 25 mg via ORAL
  Filled 2012-02-22: qty 1

## 2012-02-22 MED ORDER — AMLODIPINE BESYLATE 5 MG PO TABS
5.0000 mg | ORAL_TABLET | Freq: Every day | ORAL | Status: DC
  Filled 2012-02-22: qty 1

## 2012-02-22 MED ORDER — PANTOPRAZOLE SODIUM 40 MG PO TBEC
40.0000 mg | DELAYED_RELEASE_TABLET | Freq: Every day | ORAL | Status: DC
  Administered 2012-02-23 – 2012-02-24 (×2): 40 mg via ORAL
  Filled 2012-02-22 (×3): qty 1

## 2012-02-22 MED ORDER — AMLODIPINE BESYLATE 5 MG PO TABS
5.0000 mg | ORAL_TABLET | Freq: Every day | ORAL | Status: DC
  Administered 2012-02-23 – 2012-02-24 (×2): 5 mg
  Filled 2012-02-22 (×2): qty 1

## 2012-02-22 MED ORDER — ONDANSETRON HCL 4 MG/2ML IJ SOLN: 4.0000 mg | Freq: Three times a day (TID) | INTRAMUSCULAR | Status: DC | PRN

## 2012-02-22 MED ORDER — MOMETASONE FURO-FORMOTEROL FUM 200-5 MCG/ACT IN AERO
2.0000 | INHALATION_SPRAY | Freq: Two times a day (BID) | RESPIRATORY_TRACT | Status: DC
  Administered 2012-02-22 – 2012-02-24 (×4): 2 via RESPIRATORY_TRACT
  Filled 2012-02-22: qty 13

## 2012-02-22 MED ORDER — SODIUM CHLORIDE 0.9 % IV BOLUS (SEPSIS)
1000.0000 mL | Freq: Once | INTRAVENOUS | Status: AC
  Administered 2012-02-22: 1000 mL via INTRAVENOUS

## 2012-02-22 MED ORDER — SIMVASTATIN 10 MG PO TABS
10.0000 mg | ORAL_TABLET | Freq: Every day | ORAL | Status: DC
  Administered 2012-02-22 – 2012-02-23 (×2): 10 mg
  Filled 2012-02-22 (×3): qty 1

## 2012-02-22 NOTE — ED Notes (Signed)
Started having abd pain 3 days ago, after eating at a buffet restaurant--No vomiting, no diarrhea, last regular BM yesterday- cramping starts at night. Has been eating out more than normal, not eating regular diet that she is used to.

## 2012-02-22 NOTE — ED Provider Notes (Signed)
Medical screening examination/treatment/procedure(s) were performed by non-physician practitioner and as supervising physician I was immediately available for consultation/collaboration.   Hanley Seamen, MD 02/22/12 [phone]

## 2012-02-22 NOTE — ED Provider Notes (Signed)
History     CSN: [id number]  Arrival date & time 02/22/12  1024   First MD Initiated Contact with Patient 02/22/12 1045      Chief Complaint  Patient presents with  . Abdominal Pain  . Nausea    (Consider location/radiation/quality/duration/timing/severity/associated sxs/prior treatment) HPI Comments: Patient with a history of hepatitis C and hypertension presents emergency department with chief complaint of abdominal cramping.  Onset of symptoms occurred approximately 3 days ago, located periumbilically, described as intermittent abdominal cramping lasting about 5-10 minutes each episode.  Severity rated at 8/10 during episodes but currently patient is pain-free.  She denies any radiation of pain and reports associated nausea and dizziness.  Patient denies any vomiting, change in bowel movements, dysuria, abnormal discharge him hematemesis, hematochezia, melena, cough, hemoptysis, chest pain, fever, night sweats or chills.  Patient states she's been eating out more often than usual and attributes her cramping to food she is not used to eating.  Patient has a surgical history including complete hysterectomy in 1999, hiatal hernia repair, and cholecystectomy.  Patient is a 46 y.o. female presenting with abdominal pain. The history is provided by the patient.  Abdominal Pain The primary symptoms of the illness include abdominal pain (crampy) and nausea. The primary symptoms of the illness do not include fever, shortness of breath, vomiting, diarrhea or dysuria.  Symptoms associated with the illness do not include chills, constipation, urgency or frequency.    Past Medical History  Diagnosis Date  . Hepatitis C   . Hyperlipidemia   . Asthma   . Herpes simplex type 2 infection   . Recurrent mouth ulceration   . Drug addiction   . Hypertension     Past Surgical History  Procedure Date  . Abdominal hysterectomy   . Cholecystectomy     Family History  Problem Relation Age of Onset    . Emphysema Father   . Heart disease Father   . Clotting disorder Father     History  Substance Use Topics  . Smoking status: Former Smoker -- 0.5 packs/day for 30 years    Types: Cigarettes    Quit date: 11/05/2006  . Smokeless tobacco: Never Used  . Alcohol Use: No    OB History    Grav Para Term Preterm Abortions TAB SAB Ect Mult Living                  Review of Systems  Constitutional: Negative for fever, chills and appetite change.  HENT: Negative for congestion.   Eyes: Negative for visual disturbance.  Respiratory: Negative for shortness of breath.   Cardiovascular: Negative for chest pain and leg swelling.  Gastrointestinal: Positive for nausea and abdominal pain (crampy). Negative for vomiting, diarrhea, constipation, blood in stool, abdominal distention, anal bleeding and rectal pain.  Genitourinary: Negative for dysuria, urgency and frequency.  Neurological: Negative for dizziness, syncope, weakness, light-headedness, numbness and headaches.  Psychiatric/Behavioral: Negative for confusion.    Allergies  Review of patient's allergies indicates no known allergies.  Home Medications   Current Outpatient Rx  Name Route Sig Dispense Refill  . AMLODIPINE-OLMESARTAN 5-20 MG PO TABS Oral Take 1 tablet by mouth daily. 30 tablet 11  . DEXLANSOPRAZOLE 60 MG PO CPDR Oral Take 60 mg by mouth daily.    Marland Kitchen FAMOTIDINE 20 MG PO TABS Oral Take 20 mg by mouth at bedtime. One at bedtime    . MOMETASONE FURO-FORMOTEROL FUM 200-5 MCG/ACT IN AERO Inhalation Inhale 2 puffs into the lungs  every 12 (twelve) hours. 13 g 11  . OMEPRAZOLE 40 MG PO CPDR  1 capsule by mouth 30 minutes before the first meal of the day    . SIMVASTATIN 10 MG PO TABS Oral Take 1 tablet (10 mg total) by mouth at bedtime.    Marland Kitchen VALACYCLOVIR HCL 1 G PO TABS Oral Take 1,000 mg by mouth 2 (two) times daily.        BP 120/88  Pulse 58  Temp 98.4 F (36.9 C) (Oral)  Resp 16  Wt 145 lb (65.772 kg)  SpO2  98%  Physical Exam  Nursing note and vitals reviewed. Constitutional: She is oriented to person, place, and time. She appears well-developed and well-nourished. No distress.  HENT:  Head: Normocephalic and atraumatic.  Mouth/Throat: Oropharynx is clear and moist. No oropharyngeal exudate.  Eyes: Conjunctivae and EOM are normal. Pupils are equal, round, and reactive to light. No scleral icterus.  Neck: Normal range of motion. Neck supple. No tracheal deviation present. No thyromegaly present.  Cardiovascular: Normal rate, regular rhythm, normal heart sounds and intact distal pulses.   Pulmonary/Chest: Effort normal and breath sounds normal. No stridor. No respiratory distress. She has no wheezes.  Abdominal: Soft.       Hyperactive bowel sounds, ttp throughout, no distension or peritoneal signs. Laparoscopic surgical scaring.   Musculoskeletal: Normal range of motion. She exhibits no edema and no tenderness.  Neurological: She is alert and oriented to person, place, and time. Coordination normal.  Skin: Skin is warm and dry. No rash noted. She is not diaphoretic. No erythema. No pallor.  Psychiatric: She has a normal mood and affect. Her behavior is normal.    ED Course  Procedures (including critical care time)  Labs Reviewed  URINALYSIS, ROUTINE W REFLEX MICROSCOPIC - Abnormal; Notable for the following:    Leukocytes, UA SMALL (*)     All other components within normal limits  CBC  COMPREHENSIVE METABOLIC PANEL  URINE MICROSCOPIC-ADD ON   Dg Abd Acute W/chest  02/22/2012  *RADIOLOGY REPORT*  Clinical Data: Abdominal pain.  Rule out free air or small bowel obstruction.  Nausea, constipation.  ACUTE ABDOMEN SERIES (ABDOMEN 2 VIEW & CHEST 1 VIEW)  Comparison: None.  Findings: Heart size is normal.  The lungs are free of focal consolidations and pleural effusions.  No free intraperitoneal air beneath the diaphragm.  Supine and erect views of the abdomen show mild dilatation of small  bowel loops in the central abdomen.  Gas is identified within nondilated loops of colon.  Visualized osseous structures have a normal appearance.  No evidence for organomegaly.  Surgical clips are identified in the left upper quadrant of the abdomen.  Multiple phleboliths are identified in the pelvis.  IMPRESSION: Findings consistent with partial or early small bowel obstruction.  Original Report Authenticated By: Patterson Hammersmith, M.D.     No diagnosis found.    MDM  SBO  Pt to ER w abd cramping x 3 days, hx of hiatal hernia repair, cholecystectomy & abd hysterectomy. Labs and imaging reviewed. NG tube placed. Pain mng in ER. PT Kept NPO and admitted to team 2, med surge.         Jaci Carrel, [place] 02/22/12 1304

## 2012-02-22 NOTE — H&P (Signed)
[facility] History and Physical  Zinnia Tindall WUJ:[id number] DOB: [birth year] DOA: 02/22/2012  Referring physician: ER physician PCP: [name], FNP   Chief Complaint: abdominal pain  HPI: 46 year old female admitted for worsening periumbilical pain, started few days prior to this admission, cramp like in nature, 8-9/10 in intensity, non radiating, on and off and associated with nausea but no vomiting. Patient reports that analgesics at home did not relieve the pain and pain comes on with rest and movement. No associated fever or chills, no reports of blood in stool or urine, no chest pain, no shortness of breath, no cough. No diarrhea or constipation.  Assessment and Plan:  Principal Problem:  *Partial small bowel obstruction - keep NPO - NG tube inserted in ED - continue antiemetics as needed and pain control - continue IV fluids  Active Problems:  HTN (hypertension) - BP at goal - continue home meds   Dyslipidemia - continue atorvastatin   Asthma - continue dulera - respiratory status stable  Code Status: Full Family Communication: Pt at bedside Disposition Plan: Admit for further evaluation; observation, Patsy Baltimore, MD  [facility] Pager [phone]  If 7PM-7AM, please contact night-coverage [email or web address] Password [email or web address] 02/22/2012, 2:23 PM   Review of Systems:   Constitutional: Negative for fever, chills and malaise/fatigue. Negative for diaphoresis.  HENT: Negative for hearing loss, ear pain, nosebleeds, congestion, sore throat, neck pain, tinnitus and ear discharge.   Eyes: Negative for blurred vision, double vision, photophobia, pain, discharge and redness.  Respiratory: Negative for cough, hemoptysis, sputum production, shortness of breath, wheezing and stridor.   Cardiovascular: Negative for chest pain, palpitations, orthopnea, claudication and leg swelling.  Gastrointestinal: per HPI  Genitourinary: Negative for  dysuria, urgency, frequency, hematuria and flank pain.  Musculoskeletal: Negative for myalgias, back pain, joint pain and falls.  Skin: Negative for itching and rash.  Neurological: Negative for dizziness and weakness. Negative for tingling, tremors, sensory change, speech change, focal weakness, loss of consciousness and headaches.  Endo/Heme/Allergies: Negative for environmental allergies and polydipsia. Does not bruise/bleed easily.  Psychiatric/Behavioral: Negative for suicidal ideas. The patient is not nervous/anxious.      Past Medical History  Diagnosis Date  . Hepatitis C   . Hyperlipidemia   . Asthma   . Herpes simplex type 2 infection   . Recurrent mouth ulceration   . Drug addiction   . Hypertension    Past Surgical History  Procedure Date  . Abdominal hysterectomy   . Cholecystectomy    Social History:  reports that she quit smoking about 5 years ago. Her smoking use included Cigarettes. She has a 15 pack-year smoking history. She has never used smokeless tobacco. She reports that she does not drink alcohol or use illicit drugs.  No Known Allergies  Family History: HTN in parents  Prior to Admission medications   Medication Sig Start Date End Date Taking? Authorizing Provider  amLODipine-olmesartan (AZOR) 5-20 MG per tablet Take 1 tablet by mouth daily. 11/07/11  Yes [name], MD  dexlansoprazole (DEXILANT) 60 MG capsule Take 60 mg by mouth daily. 02/18/12  Yes [name] S Parrett, NP  famotidine (PEPCID) 20 MG tablet Take 20 mg by mouth at bedtime. One at bedtime 07/07/11 07/06/12 Yes [name], MD  Mometasone Furo-Formoterol Fum (DULERA) 200-5 MCG/ACT AERO Inhale 2 puffs into the lungs every 12 (twelve) hours. 11/07/11  Yes [name], MD  omeprazole (PRILOSEC) 40 MG capsule 1 capsule by mouth 30 minutes before  the first meal of the day 08/25/11 08/24/12 Yes [name] S Parrett, NP  simvastatin (ZOCOR) 10 MG tablet Take 1 tablet (10 mg total) by mouth at bedtime.  08/25/11 08/24/12 Yes [name] S Parrett, NP  valACYclovir (VALTREX) 1000 MG tablet Take 1,000 mg by mouth 2 (two) times daily.     Yes Historical Provider, MD   Physical Exam: Filed Vitals:   02/22/12 1126 02/22/12 1127 02/22/12 1128 02/22/12 1321  BP: 120/74 113/77 120/88 138/94  Pulse: 58 58 58 65  Temp:      TempSrc:      Resp:      Weight:      SpO2:    99%    Physical Exam  Constitutional: Appears well-developed and well-nourished. No distress.  HENT: Normocephalic. External right and left ear normal. Oropharynx is clear and moist.  Eyes: Conjunctivae and EOM are normal. PERRLA, no scleral icterus.  Neck: Normal ROM. Neck supple. No JVD. No tracheal deviation. No thyromegaly.  CVS: RRR, S1/S2 +, no murmurs, no gallops, no carotid bruit.  Pulmonary: Effort and breath sounds normal, no stridor, rhonchi, wheezes, rales.  Abdominal: Soft. BS +,   Tenderness appreciated across mid abdomen,  No rebound  Tenderness or guarding.  Musculoskeletal: Normal range of motion. No edema and no tenderness.  Lymphadenopathy: No lymphadenopathy noted, cervical, inguinal. Neuro: Alert. Normal reflexes, muscle tone coordination. No cranial nerve deficit. Skin: Skin is warm and dry. No rash noted. Not diaphoretic. No erythema. No pallor.  Psychiatric: Normal mood and affect. Behavior, judgment, thought content normal.   Labs on Admission:  Basic Metabolic Panel:  Lab 02/22/12 1610  NA 137  K 4.1  CL 102  CO2 27  GLUCOSE 89  BUN 11  CREATININE 0.65  CALCIUM 9.6  MG --  PHOS --   Liver Function Tests:  Lab 02/22/12 1123  AST 31  ALT 31  ALKPHOS 70  BILITOT 0.4  PROT 7.5  ALBUMIN 3.8   No results found for this basename: LIPASE:5,AMYLASE:5 in the last 168 hours No results found for this basename: AMMONIA:5 in the last 168 hours CBC:  Lab 02/22/12 1123  WBC 4.8  NEUTROABS --  HGB 12.2  HCT 36.5  MCV 93.8  PLT 315   Cardiac Enzymes: No results found for this basename:  CKTOTAL:5,CKMB:5,CKMBINDEX:5,TROPONINI:5 in the last 168 hours BNP: No components found with this basename: POCBNP:5 CBG: No results found for this basename: GLUCAP:5 in the last 168 hours  Radiological Exams on Admission: Dg Abd Acute W/chest  02/22/2012  *RADIOLOGY REPORT*  Clinical Data: Abdominal pain.  Rule out free air or small bowel obstruction.  Nausea, constipation.  ACUTE ABDOMEN SERIES (ABDOMEN 2 VIEW & CHEST 1 VIEW)  Comparison: None.  Findings: Heart size is normal.  The lungs are free of focal consolidations and pleural effusions.  No free intraperitoneal air beneath the diaphragm.  Supine and erect views of the abdomen show mild dilatation of small bowel loops in the central abdomen.  Gas is identified within nondilated loops of colon.  Visualized osseous structures have a normal appearance.  No evidence for organomegaly.  Surgical clips are identified in the left upper quadrant of the abdomen.  Multiple phleboliths are identified in the pelvis.  IMPRESSION: Findings consistent with partial or early small bowel obstruction.  Original Report Authenticated By: Patterson Hammersmith, M.D.    EKG: Normal sinus rhythm, no ST/T wave changes

## 2012-02-22 NOTE — ED Notes (Signed)
Pt transported to floor at this time.

## 2012-02-23 ENCOUNTER — Inpatient Hospital Stay (HOSPITAL_COMMUNITY): Payer: Medicare Other

## 2012-02-23 LAB — COMPREHENSIVE METABOLIC PANEL
Alkaline Phosphatase: 68 U/L (ref 39–117)
BUN: 9 mg/dL (ref 6–23)
CO2: 28 mEq/L (ref 19–32)
Chloride: 105 mEq/L (ref 96–112)
Creatinine, Ser: 0.77 mg/dL (ref 0.50–1.10)
GFR calc non Af Amer: >90 mL/min (ref >90)
Glucose, Bld: 89 mg/dL (ref 70–99)
Total Bilirubin: 0.4 mg/dL (ref 0.3–1.2)

## 2012-02-23 LAB — CBC
Platelets: 294 10*3/uL (ref 150–400)
RBC: 3.84 MIL/uL — ABNORMAL LOW (ref 3.87–5.11)
RDW: 12.2 % (ref 11.5–15.5)
WBC: 4.6 10*3/uL (ref 4.0–10.5)

## 2012-02-23 LAB — GLUCOSE, CAPILLARY: Glucose-Capillary: 78 mg/dL (ref 70–99)

## 2012-02-23 MED ORDER — DOCUSATE SODIUM 100 MG PO CAPS
100.0000 mg | ORAL_CAPSULE | Freq: Two times a day (BID) | ORAL | Status: DC
  Administered 2012-02-23 – 2012-02-24 (×3): 100 mg via ORAL
  Filled 2012-02-23 (×4): qty 1

## 2012-02-23 MED ORDER — POLYETHYLENE GLYCOL 3350 17 G PO PACK
17.0000 g | PACK | Freq: Every day | ORAL | Status: DC
  Administered 2012-02-23 – 2012-02-24 (×2): 17 g via ORAL
  Filled 2012-02-23 (×2): qty 1

## 2012-02-23 NOTE — Progress Notes (Signed)
[facility] PROGRESS NOTE  Lilit Cinelli ZOX:[id number] DOB: [birth year] DOA: 02/22/2012 PCP: [name], FNP   HPI/Subjective: Feels better today. Still has come LLQ pain but much better  Assessment/Plan: Principal Problem:  *Partial small bowel obstruction Patient made n.p.o. and an NG tube placed in outpatient. -Cause of her partial small bowel function is not really clear. She is a not on any narcotics however does inform low  fluid intake and being constipated usually (her usual bowel movement is 1-2 per week) -Repeat abdominal x-ray shows improved bowel gas pattern -remove NG and start on clears -Monitor daily -Add Miralax and Colace   Asthma Continue home medications   HTN (hypertension) Stable. Continue amlodipine    Dyslipidemia Continue statin  Code Status: Full Family Communication: At bedside Disposition Plan: Home on stable   Brief narrative: 46 year old female admitted for worsening periumbilical pain, started few days prior to this admission, cramp like in nature, 8-9/10 in intensity, non radiating, on and off and associated with nausea but no vomiting. X-ray shows early small bowel obstruction   Consultants:  None  Procedures:  none  Antibiotics:  None    Objective: Filed Vitals:   02/23/12 0230 02/23/12 0522 02/23/12 1000 02/23/12 1026  BP: 124/81 124/76 110/60   Pulse: 49 51 55   Temp: 98 F (36.7 C) 98.1 F (36.7 C) 99.4 F (37.4 C)   TempSrc: Oral Oral Oral   Resp: 16 16 16    Height:      Weight:  69.31 kg (152 lb 12.8 oz)    SpO2: 97% 96% 96% 97%    Intake/Output Summary (Last 24 hours) at 02/23/12 1303 Last data filed at 02/23/12 1000  Gross per 24 hour  Intake   2375 ml  Output   1100 ml  Net   1275 ml   Filed Weights   02/22/12 1041 02/22/12 1637 02/23/12 0522  Weight: 65.772 kg (145 lb) 65.998 kg (145 lb 8 oz) 69.31 kg (152 lb 12.8 oz)    Exam:   General:  Middle aged female in NAD  HEENT: No pallor  moist oral mucosa, NG in place  Cardiovascular: Normal S1 and S2 no murmurs  Respiratory: Clear bilaterally no added sounds  Abdomen: Soft minimal tenderness over right lower quadrant bowel sounds present  Extremities: Warm, no edema  CNS AAO x3, nonfocal  Data Reviewed: Basic Metabolic Panel:  Lab 02/23/12 8119 02/22/12 1701 02/22/12 1123  NA 139 137 137  K 3.8 4.3 4.1  CL 105 102 102  CO2 28 30 27   GLUCOSE 89 83 89  BUN 9 8 11   CREATININE 0.77 0.71 0.65  CALCIUM 8.9 9.4 9.6  MG -- 2.0 --  PHOS -- 3.2 --   Liver Function Tests:  Lab 02/23/12 0354 02/22/12 1701 02/22/12 1123  AST 40* 40* 31  ALT 35 36* 31  ALKPHOS 68 77 70  BILITOT 0.4 0.4 0.4  PROT 6.6 7.7 7.5  ALBUMIN 3.2* 3.8 3.8   No results found for this basename: LIPASE:5,AMYLASE:5 in the last 168 hours No results found for this basename: AMMONIA:5 in the last 168 hours CBC:  Lab 02/23/12 0354 02/22/12 1701 02/22/12 1123  WBC 4.6 5.6 4.8  NEUTROABS -- 2.4 --  HGB 11.9* 13.0 12.2  HCT 36.4 39.4 36.5  MCV 94.8 94.9 93.8  PLT 294 307 315   Cardiac Enzymes: No results found for this basename: CKTOTAL:5,CKMB:5,CKMBINDEX:5,TROPONINI:5 in the last 168 hours BNP (last 3 results) No results found for  this basename: PROBNP:3 in the last 8760 hours CBG:  Lab 02/23/12 0744  GLUCAP 78    No results found for this or any previous visit (from the past 240 hour(s)).   Studies: Dg Abd Acute W/chest  02/22/2012  *RADIOLOGY REPORT*  Clinical Data: Abdominal pain.  Rule out free air or small bowel obstruction.  Nausea, constipation.  ACUTE ABDOMEN SERIES (ABDOMEN 2 VIEW & CHEST 1 VIEW)  Comparison: None.  Findings: Heart size is normal.  The lungs are free of focal consolidations and pleural effusions.  No free intraperitoneal air beneath the diaphragm.  Supine and erect views of the abdomen show mild dilatation of small bowel loops in the central abdomen.  Gas is identified within nondilated loops of colon.   Visualized osseous structures have a normal appearance.  No evidence for organomegaly.  Surgical clips are identified in the left upper quadrant of the abdomen.  Multiple phleboliths are identified in the pelvis.  IMPRESSION: Findings consistent with partial or early small bowel obstruction.  Original Report Authenticated By: Patterson Hammersmith, M.D.   Dg Abd Portable 1v  02/23/2012  *RADIOLOGY REPORT*  Clinical Data: Follow-up partial small bowel obstruction.  PORTABLE ABDOMEN - 1 VIEW  Comparison: 02/22/2012.  Findings: The NG tube is in the stomach.  The bowel gas pattern has improved.  There is progression of air and stool into the colon. Persistent air filled small bowel loops without distention.  No free air.  IMPRESSION:  Improved bowel gas pattern.   Original Report Authenticated By: P. Loralie Champagne, M.D. ( 02/23/2012 10:06:35 )     Scheduled Meds:   . sodium chloride   Intravenous STAT  . amLODipine  5 mg Per Tube Daily  . docusate sodium  100 mg Oral BID  . famotidine  20 mg Oral QHS  . irbesartan  150 mg Per Tube Daily  . Mometasone Furo-Formoterol Fum  2 puff Inhalation Q12H  . pantoprazole  40 mg Oral Q1200  . polyethylene glycol  17 g Oral Daily  . simvastatin  10 mg Per Tube QHS  . valACYclovir  1,000 mg Oral BID  . DISCONTD: amLODipine  5 mg Oral Daily  . DISCONTD: amLODipine-olmesartan  1 tablet Oral Daily  . DISCONTD: irbesartan  150 mg Oral Daily  . DISCONTD: Mometasone Furo-Formoterol Fum  2 puff Inhalation Q12H  . DISCONTD: Mometasone Furo-Formoterol Fum  2 puff Inhalation Q12H  . DISCONTD: simvastatin  10 mg Oral QHS   Continuous Infusions:   . sodium chloride 75 mL/hr at 02/23/12 1610      Time spent: 25 minutes    Carrigan Delafuente  [facility] Pager [phone]. If 8PM-8AM, please contact night-coverage at [email or web address], password [facility] 02/23/2012, 1:03 PM  LOS: 1 day

## 2012-02-24 DIAGNOSIS — J45909 Unspecified asthma, uncomplicated: Secondary | ICD-10-CM

## 2012-02-24 DIAGNOSIS — K56609 Unspecified intestinal obstruction, unspecified as to partial versus complete obstruction: Secondary | ICD-10-CM

## 2012-02-24 LAB — GLUCOSE, CAPILLARY

## 2012-02-24 MED ORDER — ACETAMINOPHEN 325 MG PO TABS
650.0000 mg | ORAL_TABLET | Freq: Four times a day (QID) | ORAL | Status: DC | PRN
  Administered 2012-02-24: 650 mg via ORAL
  Filled 2012-02-24: qty 2

## 2012-02-24 MED ORDER — SIMVASTATIN 10 MG PO TABS: 10.0000 mg | ORAL_TABLET | Freq: Every day | ORAL | Status: DC

## 2012-02-24 MED ORDER — SENNOSIDES-DOCUSATE SODIUM 8.6-50 MG PO TABS: 1.0000 | ORAL_TABLET | Freq: Two times a day (BID) | ORAL | Status: DC

## 2012-02-24 NOTE — Discharge Summary (Signed)
Physician Discharge Summary  Brittany Sparks WUJ:[id number] DOB: Nov 27, 1965 DOA: 02/22/2012  PCP: [name], FNP  Admit date: 02/22/2012 Discharge date: 02/24/2012  Recommendations for Outpatient Follow-up:  None  Discharge Diagnoses:  Principal Problem:  *Partial small bowel obstruction  Active Problems:  Asthma  HTN (hypertension)  Dyslipidemia   Discharge Condition:fair  Diet recommendation:high fibre diet  Filed Weights   02/22/12 1637 02/23/12 0522 02/24/12 0500  Weight: 65.998 kg (145 lb 8 oz) 69.31 kg (152 lb 12.8 oz) 66.452 kg (146 lb 8 oz)    History of present illness:  46 year old female admitted for worsening periumbilical pain, started few days prior to this admission, cramp like in nature, 8-9/10 in intensity, non radiating, on and off and associated with nausea but no vomiting. Patient reports that analgesics at home did not relieve the pain and pain comes on with rest and movement. No associated fever or chills, no reports of blood in stool or urine, no chest pain, no shortness of breath, no cough. No diarrhea or constipation   Hospital Course:  *Partial small bowel obstruction  Patient made n.p.o. and an NG tube placed in and admitted to medical floor -Cause of her partial small bowel function is not really clear. She is a not on any narcotics however does inform low fluid intake and being constipated usually (her usual bowel movement is 1-2 per week)  -Repeat abdominal x-ray showed  improved bowel gas pattern and clinically her abdomen is soft with no tenderness. She has been passing gas without difficulty -remove NG and has tolerated advanced diet.  -counseled on drinking plenty of water and high fiber diet to have a regular bowel movement.  -will discharge on senna and  Colace   Her remaining medical issues are stable. She can be discharged home with outpatient follow up with PCP  Procedures:  none  Consultations:  none  Discharge Exam: Filed  Vitals:   02/24/12 0655  BP: 118/74  Pulse: 57  Temp: 97.9 F (36.6 C)  Resp: 16   Filed Vitals:   02/23/12 2131 02/24/12 0500 02/24/12 0655 02/24/12 1113  BP: 110/72  118/74   Pulse: 58  57   Temp: 98.5 F (36.9 C)  97.9 F (36.6 C)   TempSrc:   Oral   Resp: 16  16   Height:      Weight:  66.452 kg (146 lb 8 oz)    SpO2: 95%  96% 97%    General: NAD Heent: No pallor, moist mucosa Cardiovascular: NS17S2, no murmurs Respiratory: clear b/l, no added sounds ABD: SOFT, nt, nd, bs+ Ext: warm, no edema CNS: aaox 3  Discharge Instructions  Discharge Orders    Future Appointments: Provider: Department: Dept Phone: Center:   02/26/2012 9:45 AM [name], MD [facility] [phone] None     Medication List  As of 02/24/2012  1:56 PM   TAKE these medications         amLODipine-olmesartan 5-20 MG per tablet   Commonly known as: AZOR   Take 1 tablet by mouth daily.      dexlansoprazole 60 MG capsule   Commonly known as: DEXILANT   Take 60 mg by mouth daily.      famotidine 20 MG tablet   Commonly known as: PEPCID   Take 20 mg by mouth at bedtime. One at bedtime      Mometasone Furo-Formoterol Fum 200-5 MCG/ACT Aero   Inhale 2 puffs into the lungs every 12 (twelve) hours.  omeprazole 40 MG capsule   Commonly known as: PRILOSEC   1 capsule by mouth 30 minutes before the first meal of the day      senna-docusate 8.6-50 MG per tablet   Commonly known as: Senokot-S   Take 1 tablet by mouth 2 (two) times daily.      simvastatin 10 MG tablet   Commonly known as: ZOCOR   Take 1 tablet (10 mg total) by mouth at bedtime.                  valACYclovir 1000 MG tablet   Commonly known as: VALTREX   Take 1,000 mg by mouth 2 (two) times daily.           Follow-up Information    Follow up with ANDERSON,TERESA, FNP in 1 week.   Contact information:   [address] [name] [address] [phone]           The results of  significant diagnostics from this hospitalization (including imaging, microbiology, ancillary and laboratory) are listed below for reference.    Significant Diagnostic Studies: Dg Abd Acute W/chest  02/22/2012  *RADIOLOGY REPORT*  Clinical Data: Abdominal pain.  Rule out free air or small bowel obstruction.  Nausea, constipation.  ACUTE ABDOMEN SERIES (ABDOMEN 2 VIEW & CHEST 1 VIEW)  Comparison: None.  Findings: Heart size is normal.  The lungs are free of focal consolidations and pleural effusions.  No free intraperitoneal air beneath the diaphragm.  Supine and erect views of the abdomen show mild dilatation of small bowel loops in the central abdomen.  Gas is identified within nondilated loops of colon.  Visualized osseous structures have a normal appearance.  No evidence for organomegaly.  Surgical clips are identified in the left upper quadrant of the abdomen.  Multiple phleboliths are identified in the pelvis.  IMPRESSION: Findings consistent with partial or early small bowel obstruction.  Original Report Authenticated By: Patterson Hammersmith, M.D.   Dg Abd Portable 1v  02/23/2012  *RADIOLOGY REPORT*  Clinical Data: Follow-up partial small bowel obstruction.  PORTABLE ABDOMEN - 1 VIEW  Comparison: 02/22/2012.  Findings: The NG tube is in the stomach.  The bowel gas pattern has improved.  There is progression of air and stool into the colon. Persistent air filled small bowel loops without distention.  No free air.  IMPRESSION:  Improved bowel gas pattern.   Original Report Authenticated By: P. Loralie Champagne, M.D. ( 02/23/2012 10:06:35 )     Microbiology: No results found for this or any previous visit (from the past 240 hour(s)).   Labs: Basic Metabolic Panel:  Lab 02/23/12 9147 02/22/12 1701 02/22/12 1123  NA 139 137 137  K 3.8 4.3 4.1  CL 105 102 102  CO2 28 30 27   GLUCOSE 89 83 89  BUN 9 8 11   CREATININE 0.77 0.71 0.65  CALCIUM 8.9 9.4 9.6  MG -- 2.0 --  PHOS -- 3.2 --   Liver  Function Tests:  Lab 02/23/12 0354 02/22/12 1701 02/22/12 1123  AST 40* 40* 31  ALT 35 36* 31  ALKPHOS 68 77 70  BILITOT 0.4 0.4 0.4  PROT 6.6 7.7 7.5  ALBUMIN 3.2* 3.8 3.8   No results found for this basename: LIPASE:5,AMYLASE:5 in the last 168 hours No results found for this basename: AMMONIA:5 in the last 168 hours CBC:  Lab 02/23/12 0354 02/22/12 1701 02/22/12 1123  WBC 4.6 5.6 4.8  NEUTROABS -- 2.4 --  HGB  11.9* 13.0 12.2  HCT 36.4 39.4 36.5  MCV 94.8 94.9 93.8  PLT 294 307 315   Cardiac Enzymes: No results found for this basename: CKTOTAL:5,CKMB:5,CKMBINDEX:5,TROPONINI:5 in the last 168 hours BNP: BNP (last 3 results) No results found for this basename: PROBNP:3 in the last 8760 hours CBG:  Lab 02/24/12 0717 02/23/12 0744  GLUCAP 93 78    Time coordinating discharge: 25 minutes  Signed:  Ashrita Chrismer  [facility] 02/24/2012, 1:56 PM

## 2012-02-24 NOTE — Progress Notes (Signed)
Patient is alert and oriented, vital signs are stable, patient was advanced to a regular diet today and tolerated the diet very well, no complaints of nausea or vomiting, tylenol given for headache and effective, MD Dhungel notified and is currently working on discharge instructions.  Means, Myrtie Hawk RN 02-24-2012 13:56pm

## 2012-02-26 ENCOUNTER — Encounter: Payer: Medicare Other | Admitting: Internal Medicine

## 2012-02-26 NOTE — Progress Notes (Signed)
 This encounter was created in error - please disregard.

## 2012-03-26 ENCOUNTER — Encounter (HOSPITAL_COMMUNITY): Payer: Self-pay | Admitting: Emergency Medicine

## 2012-03-26 ENCOUNTER — Emergency Department (HOSPITAL_COMMUNITY): Payer: Medicare Other

## 2012-03-26 ENCOUNTER — Emergency Department (HOSPITAL_COMMUNITY)
Admission: EM | Admit: 2012-03-26 | Discharge: 2012-03-26 | Disposition: A | Discharge status: Home / Self Care | Payer: Medicare Other | Attending: Emergency Medicine | Admitting: Emergency Medicine

## 2012-03-26 DIAGNOSIS — R110 Nausea: Secondary | ICD-10-CM | POA: Insufficient documentation

## 2012-03-26 DIAGNOSIS — Z79899 Other long term (current) drug therapy: Secondary | ICD-10-CM | POA: Insufficient documentation

## 2012-03-26 DIAGNOSIS — Z9089 Acquired absence of other organs: Secondary | ICD-10-CM | POA: Insufficient documentation

## 2012-03-26 DIAGNOSIS — R109 Unspecified abdominal pain: Secondary | ICD-10-CM

## 2012-03-26 DIAGNOSIS — R1033 Periumbilical pain: Secondary | ICD-10-CM | POA: Insufficient documentation

## 2012-03-26 LAB — COMPREHENSIVE METABOLIC PANEL
Albumin: 3.9 g/dL (ref 3.5–5.2)
Alkaline Phosphatase: 77 U/L (ref 39–117)
BUN: 10 mg/dL (ref 6–23)
Calcium: 9 mg/dL (ref 8.4–10.5)
GFR calc Af Amer: >90 mL/min (ref >90)
Potassium: 3.6 mEq/L (ref 3.5–5.1)
Total Protein: 7.8 g/dL (ref 6.0–8.3)

## 2012-03-26 LAB — URINALYSIS, MICROSCOPIC ONLY
Bilirubin Urine: NEGATIVE
Nitrite: NEGATIVE
Specific Gravity, Urine: 1.031 — ABNORMAL HIGH (ref 1.005–1.030)
Urobilinogen, UA: 1 mg/dL (ref 0.0–1.0)
pH: 5.5 (ref 5.0–8.0)

## 2012-03-26 LAB — CBC WITH DIFFERENTIAL/PLATELET
Basophils Relative: 0 % (ref 0–1)
Eosinophils Absolute: 0 10*3/uL (ref 0.0–0.7)
Eosinophils Relative: 0 % (ref 0–5)
MCH: 30.8 pg (ref 26.0–34.0)
MCHC: 32.9 g/dL (ref 30.0–36.0)
MCV: 93.7 fL (ref 78.0–100.0)
Monocytes Relative: 7 % (ref 3–12)
Neutrophils Relative %: 75 % (ref 43–77)
Platelets: 295 10*3/uL (ref 150–400)

## 2012-03-26 LAB — LIPASE, BLOOD: Lipase: 23 U/L (ref 11–59)

## 2012-03-26 MED ORDER — HYDROMORPHONE HCL PF 1 MG/ML IJ SOLN
1.0000 mg | Freq: Once | INTRAMUSCULAR | Status: AC
  Administered 2012-03-26: 1 mg via INTRAVENOUS
  Filled 2012-03-26: qty 1

## 2012-03-26 MED ORDER — SODIUM CHLORIDE 0.9 % IV BOLUS (SEPSIS)
1000.0000 mL | Freq: Once | INTRAVENOUS | Status: AC
  Administered 2012-03-26: 1000 mL via INTRAVENOUS

## 2012-03-26 MED ORDER — ONDANSETRON HCL 4 MG/2ML IJ SOLN: 4.0000 mg | Freq: Once | INTRAMUSCULAR | Status: AC

## 2012-03-26 MED ORDER — HYOSCYAMINE SULFATE CR 0.375 MG PO CP12: 0.3750 mg | ORAL_CAPSULE | Freq: Two times a day (BID) | ORAL | Status: DC | PRN

## 2012-03-26 MED ORDER — SODIUM CHLORIDE 0.9 % IV SOLN
1000.0000 mL | Freq: Once | INTRAVENOUS | Status: AC
  Administered 2012-03-26: 1000 mL via INTRAVENOUS

## 2012-03-26 MED ORDER — ONDANSETRON 4 MG PO TBDP: ORAL_TABLET | ORAL | Status: DC

## 2012-03-26 MED ORDER — ONDANSETRON HCL 4 MG/2ML IJ SOLN
4.0000 mg | Freq: Once | INTRAMUSCULAR | Status: AC
  Administered 2012-03-26: 4 mg via INTRAVENOUS
  Filled 2012-03-26: qty 2

## 2012-03-26 NOTE — ED Provider Notes (Signed)
History     CSN: [id number]  Arrival date & time 03/26/12  1256   First MD Initiated Contact with Patient 03/26/12 1312      Chief Complaint  Patient presents with  . Abdominal Pain    (Consider location/radiation/quality/duration/timing/severity/associated sxs/prior treatment) HPI Patient complaining of abdominal pain. She states that she was hospitalized for a small bowel structure in 3 weeks ago. She states she began having some pain began about a week ago. Pain worsened last night. She has had nausea but no vomiting. She has had some diarrhea. She describes diffuse crampy periumbilical abdominal pain that is worse with food intake. She has not been able to eat today. She has not taken anything for the pain. She denies any fever or chills. It is unclear why she had bowel obstruction. Past Medical History  Diagnosis Date  . Hepatitis C   . Hyperlipidemia   . Asthma   . Herpes simplex type 2 infection   . Recurrent mouth ulceration   . Drug addiction   . Hypertension     Past Surgical History  Procedure Date  . Abdominal hysterectomy   . Cholecystectomy     Family History  Problem Relation Age of Onset  . Emphysema Father   . Heart disease Father   . Clotting disorder Father     History  Substance Use Topics  . Smoking status: Former Smoker -- 0.5 packs/day for 30 years    Types: Cigarettes    Quit date: 11/05/2006  . Smokeless tobacco: Never Used  . Alcohol Use: No    OB History    Grav Para Term Preterm Abortions TAB SAB Ect Mult Living                  Review of Systems  Constitutional: Negative for fever, chills, activity change, appetite change and unexpected weight change.  HENT: Negative for sore throat, rhinorrhea, neck pain, neck stiffness and sinus pressure.   Eyes: Negative for visual disturbance.  Respiratory: Negative for cough and shortness of breath.   Cardiovascular: Negative for chest pain and leg swelling.  Gastrointestinal: Negative  for vomiting, abdominal pain, diarrhea and blood in stool.  Genitourinary: Negative for dysuria, urgency, frequency, vaginal discharge and difficulty urinating.  Musculoskeletal: Negative for myalgias, arthralgias and gait problem.  Skin: Negative for color change and rash.  Neurological: Negative for weakness, light-headedness and headaches.  Hematological: Does not bruise/bleed easily.  Psychiatric/Behavioral: Negative for dysphoric mood.    Allergies  Review of patient's allergies indicates no known allergies.  Home Medications   Current Outpatient Rx  Name Route Sig Dispense Refill  . AMLODIPINE-OLMESARTAN 5-20 MG PO TABS Oral Take 1 tablet by mouth daily. 30 tablet 11  . DEXLANSOPRAZOLE 60 MG PO CPDR Oral Take 60 mg by mouth daily.    Marland Kitchen FAMOTIDINE 20 MG PO TABS Oral Take 20 mg by mouth at bedtime.     . MOMETASONE FURO-FORMOTEROL FUM 200-5 MCG/ACT IN AERO Inhalation Inhale 2 puffs into the lungs every 12 (twelve) hours. 13 g 11  . OMEPRAZOLE 40 MG PO CPDR Oral Take 40 mg by mouth. 1 capsule by mouth 30 minutes before the first meal of the day    . SIMVASTATIN 10 MG PO TABS Oral Take 1 tablet (10 mg total) by mouth at bedtime.    Marland Kitchen VALACYCLOVIR HCL 1 G PO TABS Oral Take 1,000 mg by mouth 2 (two) times daily.       BP 120/85  Pulse  101  Temp 99.1 F (37.3 C) (Oral)  Resp 17  SpO2 95%  Physical Exam  Nursing note and vitals reviewed. Constitutional: She is oriented to person, place, and time. She appears well-developed and well-nourished.  HENT:  Head: Normocephalic and atraumatic.  Eyes: Conjunctivae normal and EOM are normal. Pupils are equal, round, and reactive to light.  Neck: Normal range of motion. Neck supple.  Cardiovascular: Normal rate and regular rhythm.   Pulmonary/Chest: Effort normal and breath sounds normal.  Abdominal: Bowel sounds are normal. There is tenderness.       Diffusely tender moderately distended abdomen with very few bowel sounds.    Musculoskeletal: Normal range of motion.  Neurological: She is alert and oriented to person, place, and time.  Skin: Skin is warm.  Psychiatric: She has a normal mood and affect.    ED Course  Procedures (including critical care time)   Labs Reviewed  CBC WITH DIFFERENTIAL  COMPREHENSIVE METABOLIC PANEL  LIPASE, BLOOD  URINALYSIS, MICROSCOPIC ONLY  URINALYSIS, ROUTINE W REFLEX MICROSCOPIC  PREGNANCY, URINE   Dg Abd Acute W/chest  03/26/2012  *RADIOLOGY REPORT*  Clinical Data: Abdominal pain.  Nausea.  History of small bowel obstruction approximately 1 month ago.  ACUTE ABDOMEN SERIES (ABDOMEN 2 VIEW & CHEST 1 VIEW)  Comparison: One-view abdomen x-Johnda Billiot 02/23/2012.  Acute abdomen series 02/22/2012.  Findings: Nonobstructive bowel gas pattern.  Gas and upper normal caliber colon, with a tortuous sigmoid colon extending upward into the mid abdomen.  Expected stool burden.  No small bowel distention.  Air-fluid levels in the colon on the erect image consistent with liquid stool.  No evidence of free intraperitoneal air.  Numerous pelvic phleboliths.  No visible opaque urinary tract calculi.  Spina bifida occulta at L5.  Calcified granuloma deep in the right lower lobe.  Lungs otherwise clear.  No pleural effusions.  Cardiomediastinal silhouette unremarkable.  IMPRESSION: No acute abdominal or pulmonary abnormality.   Original Report Authenticated By: Arnell Sieving, M.D.      No diagnosis found.    MDM       Discussed with Dr. Nino Parsley 4:12 PM Patient awaiting ct scan.  Patient informed of plan.   Hilario Quarry, MD 03/26/12 [phone]

## 2012-03-26 NOTE — ED Notes (Signed)
Per MD ok to do CT without iv contrast

## 2012-03-26 NOTE — ED Notes (Signed)
Pt presenting to ed with c/o abdominal pain, positive nausea, no vomiting. Pt states she was here 3 weeks ago for a small bowel obstruction and symptoms feel the exact same.

## 2012-03-26 NOTE — ED Notes (Signed)
Pt reminded of urine sample, however is still unable to void at this time.

## 2012-03-26 NOTE — ED Notes (Signed)
Pt aware of the need for a urine sample RN at bedside starting IV.

## 2012-03-26 NOTE — ED Notes (Signed)
Pt unable to urinate at this time. Will reassess.

## 2012-03-26 NOTE — ED Provider Notes (Signed)
Assumed care from Dr. Rosalia Hammers, reviewed.  The chart and reevaluated the patient.  I agree with her assessment and plan.  Presently, the patient is pain-free.  She does not have an acute abdomen.  I explained the results of her CAT scan, and the plan with she agrees  Cheri Guppy, MD 03/26/12 [phone]

## 2012-03-27 NOTE — ED Notes (Signed)
Pharmacist called about the Rx of Zofran ODT 4mg  prescribed by MD Caporossi, sts it's too expensive.  I took chart to PA [place] and she cancelled the old Zofran Rx and wrote for Phenergan 12.5mg  tab, #12, sig: 1 tab PO q 4 hrs PRN nausea.  Called into [facility] and left on Voicemail at [phone].

## 2012-04-26 ENCOUNTER — Ambulatory Visit (INDEPENDENT_AMBULATORY_CARE_PROVIDER_SITE_OTHER): Payer: Medicare Other | Admitting: Internal Medicine

## 2012-04-26 ENCOUNTER — Encounter: Payer: Self-pay | Admitting: Internal Medicine

## 2012-04-26 VITALS — BP 108/60 | HR 85 | Temp 98.8°F | Ht 60.0 in | Wt 146.0 lb

## 2012-04-26 DIAGNOSIS — R05 Cough: Secondary | ICD-10-CM

## 2012-04-26 DIAGNOSIS — R059 Cough, unspecified: Secondary | ICD-10-CM

## 2012-04-26 DIAGNOSIS — J45909 Unspecified asthma, uncomplicated: Secondary | ICD-10-CM

## 2012-04-26 MED ORDER — AMOXICILLIN-POT CLAVULANATE 875-125 MG PO TABS: 1.0000 | ORAL_TABLET | Freq: Two times a day (BID) | ORAL | Status: DC

## 2012-04-26 MED ORDER — PREDNISONE (PAK) 10 MG PO TABS: ORAL_TABLET | ORAL | Status: DC

## 2012-04-26 MED ORDER — TRAMADOL HCL 50 MG PO TABS: ORAL_TABLET | ORAL | Status: DC

## 2012-04-26 NOTE — Patient Instructions (Addendum)
Take delsym two tsp every 12 hours and supplement if needed with  tramadol 50 mg up to 1- 2 every 4 hours to suppress the urge to cough. Swallowing water or using ice chips/non mint and menthol containing candies (such as lifesavers or sugarless jolly ranchers) are also effective.  You should rest your voice and avoid activities that you know make you cough.  Once you have eliminated the cough for 3 straight days try reducing the tramadol first,  then the delsym as tolerated.    Augmentin x 10 days Prednisone 10 mg take  4 each am x 2 days,   2 each am x 2 days,  1 each am x2days and stop   Please schedule a follow up visit in 3 months but call sooner if needed in 3 months with pfts  .

## 2012-04-26 NOTE — Progress Notes (Signed)
Subjective:    Patient ID: Brittany Sparks, female    DOB: 1966/01/21  MRN: [id number]  HPI  Brief patient profile:  45 yobm quit smoking 2008 p dx of asthma by [facility] around 2000 referred 07/07/2011 to  Pulmonary clinic by [name].  07/07/2011 1st pulmonary cc cough x 12 years, daily no treatment for asthma has helped the cough including advair then abupt onset cough and sob much worse   x 3 weeks changed bp meds x 2 weeks ? Was this an ace inhibitor? - cough is dry more day than night but present 24 h per day, minimal sob, controls with saba but takes up to 10 puffs at a time before improves. rec Try aciphex  20mg   Take 30-60 min before first meal of the day and Pepcid 20 mg one bedtime  Symbicort 160 Take 2 puffs first thing in am and then another 2 puffs about 12 hours later.  Work on inhaler technique:  Only use your albuterol(ventolin, blue inhaler) as a rescue medication .  Best cough medication is delsym   08/11/2011 f/u ov/Isao Seltzer on prevacid 30 mg and pepcid one at bedtime Cough improved but worsened again x 1 wk prior to OV   Shortness of Breath improved. Still using ventolin day but not typically at night.  Inconsistent with meds, unsure of names, does not really understand maint vs prns and stops them when she feels better or rx runs out.  >>prednisone taper , changed to AZOR from cozaar/norvasc ,augmentin rx  08/25/2011 Follow up and med review  Pt returns for follow up and med review. Since last ov feeling much better.  We reviewed all her meds and organized them into a med calendar  W/ pt education  It appears she is taking meds correctly.  No chest pain or edema. Dyspnea is less.  rec Stop Goody powder/meds  Follow med calendar closely and bring to each visit.  Brush/rinse and gargle after inhaler use  10/10/2011 f/u ov/Myrtha Tonkovich cc overall better but still using ventolin 3-4 x week esp with activity due to sob, no purulent sputum or over sinus or reflux  symptoms. rec Try substituting the dulera 200 Take 2 puffs first thing in am and then another 2 puffs about 12 hours later.  Dexilant 60 Take 30-60 min before first meal of the day while waiting to sort out the omeprazole  Issue   11/07/2011 f/u ov/Sherby Moncayo cc only cough in am attributed to sinus drainage and heavy perfume, better when takes flonase first thing in am, no excess mucus day or night nor any discolored secretions, no variability or overt hb symptoms. rec Minimize your perfume use if possible Change the way you take your flonase and use it automatically at bedtime and as needed during the day as per calendar   02/26/2012 f/u ov/Teagan Ozawa cc missed appt  04/26/2012 f/u ov/ Silvio Sausedo cough had resolved completely  Then  C/o new onset recurrent  cough x 3 wks- prod with thick, green sputum while still on dulera 200 2bid and ventolin not at all. No obvious daytime variabilty or assoc   cp or chest tightness, subjective wheeze overt sinus or hb symptoms. No unusual exp hx     Sleeping ok without nocturnal  or early am exacerbation  of respiratory  c/o's or need for noct saba. Also denies any obvious fluctuation of symptoms with weather or environmental changes or other aggravating or alleviating factors except as outlined above   ROS  The following are not active complaints unless bolded sore throat, dysphagia, dental problems, itching, sneezing,  nasal congestion or excess/ purulent secretions, ear ache,   fever, chills, sweats, unintended wt loss, pleuritic or exertional cp, hemoptysis,  orthopnea pnd or leg swelling, presyncope, palpitations, heartburn, abdominal pain, anorexia, nausea, vomiting, diarrhea  or change in bowel or urinary habits, change in stools or urine, dysuria,hematuria,  rash, arthralgias, visual complaints, headache, numbness weakness or ataxia or problems with walking or coordination,  change in mood/affect or memory.                                Objective:    Physical Exam  amb bf nad   Wt 149 07/07/2011 > 08/11/2011  153>>151 08/25/2011 >    146  04/26/2012   HEENT: nl dentition, turbinates, and orophanx. Nl external ear canals without cough reflex   NECK :  without JVD/Nodes/TM/ nl carotid upstrokes bilaterally   LUNGS: no acc muscle use, clear to A and P bilaterally without cough on insp or exp maneuvers   CV:  RRR  no s3 or murmur or increase in P2, no edema   ABD:  soft and nontender with nl excursion in the supine position. No bruits or organomegaly, bowel sounds nl  MS:  warm without deformities, calf tenderness, cyanosis or clubbing     06/20/11 CXR at [facility] reported nl       Assessment & Plan:

## 2012-04-26 NOTE — Assessment & Plan Note (Signed)
Acute assoc with purulent tracheobronchitis suggesting ? Element of sinusitis so rx with augmentin x 10 days and 6 days of prednisone then regroup if not better

## 2012-04-26 NOTE — Assessment & Plan Note (Addendum)
Adequate control on present rx, reviewed she's doing well considering with the flare of bronchitis one would expect increasing airways instability that appears to be kept in check by dulera as maint ? Step down feasible next ov p check baseline pfts   Each maintenance medication was reviewed in detail including most importantly the difference between maintenance and as needed and under what circumstances the prns are to be used.  Please see instructions for details which were reviewed in writing and the patient given a copy.

## 2012-07-29 ENCOUNTER — Encounter: Payer: Self-pay | Admitting: Internal Medicine

## 2012-07-29 ENCOUNTER — Ambulatory Visit (INDEPENDENT_AMBULATORY_CARE_PROVIDER_SITE_OTHER): Payer: Medicare Other | Admitting: Internal Medicine

## 2012-07-29 VITALS — BP 122/70 | HR 73 | Temp 98.3°F | Ht 60.0 in | Wt 155.0 lb

## 2012-07-29 DIAGNOSIS — J449 Chronic obstructive pulmonary disease, unspecified: Secondary | ICD-10-CM

## 2012-07-29 DIAGNOSIS — R05 Cough: Secondary | ICD-10-CM

## 2012-07-29 DIAGNOSIS — J45909 Unspecified asthma, uncomplicated: Secondary | ICD-10-CM

## 2012-07-29 LAB — PULMONARY FUNCTION TEST

## 2012-07-29 MED ORDER — MOMETASONE FURO-FORMOTEROL FUM 200-5 MCG/ACT IN AERO: 2.0000 | INHALATION_SPRAY | Freq: Two times a day (BID) | RESPIRATORY_TRACT | Status: DC

## 2012-07-29 NOTE — Progress Notes (Signed)
PFT done today. 

## 2012-07-29 NOTE — Patient Instructions (Addendum)
You have mild copd and it won't progress unless you resume smoking   If you are satisfied with your treatment plan let your doctor know and he/she can either refill your medications or you can return here when your prescription runs out (you are good for a year)   If in any way you are not 100% satisfied,  please tell us.  If 100% better, tell your friends!   [email or web address] for generics, cash only

## 2012-07-29 NOTE — Progress Notes (Signed)
Subjective:    Patient ID: Brittany Sparks, female    DOB: 02-Aug-1965  MRN: [id number]  HPI  Brief patient profile:  46 yobm quit smoking 2008 p dx of asthma by [facility] around 2000 referred 07/07/2011 to  Pulmonary clinic by [name].  07/07/2011 1st pulmonary cc cough x 12 years, daily no treatment for asthma has helped the cough including advair then abupt onset cough and sob much worse   x 3 weeks changed bp meds x 2 weeks ? Was this an ace inhibitor? - cough is dry more day than night but present 24 h per day, minimal sob, controls with saba but takes up to 10 puffs at a time before improves. rec Try aciphex  20mg   Take 30-60 min before first meal of the day and Pepcid 20 mg one bedtime  Symbicort 160 Take 2 puffs first thing in am and then another 2 puffs about 12 hours later.  Work on inhaler technique:  Only use your albuterol(ventolin, blue inhaler) as a rescue medication .  Best cough medication is delsym   08/11/2011 f/u ov/Kyung Muto on prevacid 30 mg and pepcid one at bedtime Cough improved but worsened again x 1 wk prior to OV   Shortness of Breath improved. Still using ventolin day but not typically at night.  Inconsistent with meds, unsure of names, does not really understand maint vs prns and stops them when she feels better or rx runs out.  >>prednisone taper , changed to AZOR from cozaar/norvasc ,augmentin rx  08/25/2011 Follow up and med review  Pt returns for follow up and med review. Since last ov feeling much better.  We reviewed all her meds and organized them into a med calendar  W/ pt education  It appears she is taking meds correctly.  No chest pain or edema. Dyspnea is less.  rec Stop Goody powder/meds  Follow med calendar closely and bring to each visit.  Brush/rinse and gargle after inhaler use  10/10/2011 f/u ov/[name] cc overall better but still using ventolin 3-4 x week esp with activity due to sob, no purulent sputum or over sinus or reflux  symptoms. rec Try substituting the dulera 200 Take 2 puffs first thing in am and then another 2 puffs about 12 hours later.  Dexilant 60 Take 30-60 min before first meal of the day while waiting to sort out the omeprazole  Issue   11/07/2011 f/u ov/[name] cc only cough in am attributed to sinus drainage and heavy perfume, better when takes flonase first thing in am, no excess mucus day or night nor any discolored secretions, no variability or overt hb symptoms. rec Minimize your perfume use if possible Change the way you take your flonase and use it automatically at bedtime and as needed during the day as per calendar   02/26/2012 f/u ov/[name] cc missed appt  04/26/2012 f/u ov/ [name] cough had resolved completely  Then  C/o new onset recurrent  cough x 3 wks- prod with thick, green sputum while still on dulera 200 2bid and ventolin not at all.  rec Augmentin x 10 days Prednisone 10 mg take  4 each am x 2 days,   2 each am x 2 days,  1 each am x2days and stop  Eliminate cyclical coughing with tramadol  07/29/2012 f/u ov/[name] cc all better except cough came back when backed off acid suppression.  No obvious daytime variabilty or assoc   cp or chest tightness, subjective wheeze overt sinus or  hb symptoms. No unusual exp hx     Sleeping ok without nocturnal  or early am exacerbation  of respiratory  c/o's or need for noct saba. Also denies any obvious fluctuation of symptoms with weather or environmental changes or other aggravating or alleviating factors except as outlined above   ROS  The following are not active complaints unless bolded sore throat, dysphagia, dental problems, itching, sneezing,  nasal congestion or excess/ purulent secretions, ear ache,   fever, chills, sweats, unintended wt loss, pleuritic or exertional cp, hemoptysis,  orthopnea pnd or leg swelling, presyncope, palpitations, heartburn, abdominal pain, anorexia, nausea, vomiting, diarrhea  or change in bowel or urinary habits,  change in stools or urine, dysuria,hematuria,  rash, arthralgias, visual complaints, headache, numbness weakness or ataxia or problems with walking or coordination,  change in mood/affect or memory.                 Objective:   Physical Exam  amb bf nad   Wt 149 07/07/2011 > 08/11/2011  153>>151 08/25/2011 >    146  04/26/2012 > 155 07/29/2012   HEENT: nl dentition, turbinates, and orophanx. Nl external ear canals without cough reflex   NECK :  without JVD/Nodes/TM/ nl carotid upstrokes bilaterally   LUNGS: no acc muscle use, clear to A and P bilaterally without cough on insp or exp maneuvers   CV:  RRR  no s3 or murmur or increase in P2, no edema   ABD:  soft and nontender with nl excursion in the supine position. No bruits or organomegaly, bowel sounds nl  MS:  warm without deformities, calf tenderness, cyanosis or clubbing     06/20/11 CXR at [facility] reported nl       Assessment & Plan:   Outpatient Encounter Prescriptions as of 07/29/2012  Medication Sig Dispense Refill  . amLODipine-olmesartan (AZOR) 5-20 MG per tablet Take 1 tablet by mouth daily.  30 tablet  11  . dexlansoprazole (DEXILANT) 60 MG capsule Take 60 mg by mouth daily.      . famotidine (PEPCID) 20 MG tablet Take 20 mg by mouth at bedtime.       . mometasone-formoterol (DULERA) 200-5 MCG/ACT AERO Inhale 2 puffs into the lungs every 12 (twelve) hours.  13 g  11  . simvastatin (ZOCOR) 10 MG tablet Take 1 tablet (10 mg total) by mouth at bedtime.      . traMADol (ULTRAM) 50 MG tablet 1-2 every 4 hours as needed for cough or pain  40 tablet  0  . valACYclovir (VALTREX) 1000 MG tablet Take 1,000 mg by mouth 2 (two) times daily.       . [DISCONTINUED] Mometasone Furo-Formoterol Fum (DULERA) 200-5 MCG/ACT AERO Inhale 2 puffs into the lungs every 12 (twelve) hours.  13 g  11  . [DISCONTINUED] amoxicillin-clavulanate (AUGMENTIN) 875-125 MG per tablet Take 1 tablet by mouth 2 (two) times daily.  20 tablet  0  .  [DISCONTINUED] predniSONE (STERAPRED UNI-PAK) 10 MG tablet Prednisone 10 mg take  4 each am x 2 days,   2 each am x 2 days,  1 each am x2days and stop  14 tablet  0

## 2012-07-29 NOTE — Progress Notes (Deleted)
Subjective:    Patient ID: Brittany Sparks, female    DOB: 16-Jul-1965  MRN: [id number]     Brief patient profile:  45 yobm quit smoking 2008 p dx of asthma by [facility] around 2000 referred 07/07/2011 to  Pulmonary clinic by [name].  07/07/2011 1st pulmonary cc cough x 12 years, daily no treatment for asthma has helped the cough including advair then abupt onset cough and sob much worse   x 3 weeks changed bp meds x 2 weeks ? Was this an ace inhibitor? - cough is dry more day than night but present 24 h per day, minimal sob, controls with saba but takes up to 10 puffs at a time before improves. rec Try aciphex  20mg   Take 30-60 min before first meal of the day and Pepcid 20 mg one bedtime  Symbicort 160 Take 2 puffs first thing in am and then another 2 puffs about 12 hours later.  Work on inhaler technique:  Only use your albuterol(ventolin, blue inhaler) as a rescue medication .  Best cough medication is delsym   08/11/2011 f/u ov/Krisanne Lich on prevacid 30 mg and pepcid one at bedtime Cough improved but worsened again x 1 wk prior to OV   Shortness of Breath improved. Still using ventolin day but not typically at night.  Inconsistent with meds, unsure of names, does not really understand maint vs prns and stops them when she feels better or rx runs out.  >>prednisone taper , changed to AZOR from cozaar/norvasc ,augmentin rx  08/25/2011 Follow up and med review  Pt returns for follow up and med review. Since last ov feeling much better.  We reviewed all her meds and organized them into a med calendar  W/ pt education  It appears she is taking meds correctly.  No chest pain or edema. Dyspnea is less.  rec Stop Goody powder/meds  Follow med calendar closely and bring to each visit.  Brush/rinse and gargle after inhaler use  10/10/2011 f/u ov/Ashar Lewinski cc overall better but still using ventolin 3-4 x week esp with activity due to sob, no purulent sputum or over sinus or reflux  symptoms. rec Try substituting the dulera 200 Take 2 puffs first thing in am and then another 2 puffs about 12 hours later.  Dexilant 60 Take 30-60 min before first meal of the day while waiting to sort out the omeprazole  Issue   11/07/2011 f/u ov/Poppi Scantling cc only cough in am attributed to sinus drainage and heavy perfume, better when takes flonase first thing in am, no excess mucus day or night nor any discolored secretions, no variability or overt hb symptoms. rec Minimize your perfume use if possible Change the way you take your flonase and use it automatically at bedtime and as needed during the day as per calendar   02/26/2012 f/u ov/Kaelani Kendrick cc missed appt  07/29/2012 f/u ov/ Romy Ipock cough had resolved completely  Then  C/o new onset recurrent  cough x 3 wks- prod with thick, green sputum while still on dulera 200 2bid and ventolin not at all. No obvious daytime variabilty or assoc   cp or chest tightness, subjective wheeze overt sinus or hb symptoms. No unusual exp hx     Sleeping ok without nocturnal  or early am exacerbation  of respiratory  c/o's or need for noct saba. Also denies any obvious fluctuation of symptoms with weather or environmental changes or other aggravating or alleviating factors except as outlined above   ROS  The following are not active complaints unless bolded sore throat, dysphagia, dental problems, itching, sneezing,  nasal congestion or excess/ purulent secretions, ear ache,   fever, chills, sweats, unintended wt loss, pleuritic or exertional cp, hemoptysis,  orthopnea pnd or leg swelling, presyncope, palpitations, heartburn, abdominal pain, anorexia, nausea, vomiting, diarrhea  or change in bowel or urinary habits, change in stools or urine, dysuria,hematuria,  rash, arthralgias, visual complaints, headache, numbness weakness or ataxia or problems with walking or coordination,  change in mood/affect or memory.                                Objective:    Physical Exam  amb bf nad   Wt 149 07/07/2011 > 08/11/2011  153>>151 08/25/2011 >    146  07/29/2012   HEENT: nl dentition, turbinates, and orophanx. Nl external ear canals without cough reflex   NECK :  without JVD/Nodes/TM/ nl carotid upstrokes bilaterally   LUNGS: no acc muscle use, clear to A and P bilaterally without cough on insp or exp maneuvers   CV:  RRR  no s3 or murmur or increase in P2, no edema   ABD:  soft and nontender with nl excursion in the supine position. No bruits or organomegaly, bowel sounds nl  MS:  warm without deformities, calf tenderness, cyanosis or clubbing     06/20/11 CXR at [facility] reported nl       Assessment & Plan:

## 2012-07-30 NOTE — Assessment & Plan Note (Signed)
-   hfa improved to 75% 07/07/11 > 100% 08/11/2011    - PFTs 07/29/2012 FEV1 1.39 (61%)  Ratio 60 and no better p B2  DLCO 55%  > 114%  GOLD II with symptoms exac by gerd but well compensated on present rx  I had an extended summary  discussion with the patient today lasting 15 to 20 minutes of a 25 minute visit on the following issues:     Each maintenance medication was reviewed in detail including most importantly the difference between maintenance and as needed and under what circumstances the prns are to be used.  Please see instructions for details which were reviewed in writing and the patient given a copy.    Pulmonary f/u can be prn

## 2012-08-23 ENCOUNTER — Encounter: Payer: Self-pay | Admitting: Internal Medicine

## 2012-11-08 ENCOUNTER — Other Ambulatory Visit: Payer: Self-pay | Admitting: Internal Medicine

## 2012-11-08 MED ORDER — AMLODIPINE-OLMESARTAN 5-20 MG PO TABS: 1.0000 | ORAL_TABLET | Freq: Every day | ORAL | Status: DC

## 2012-12-11 ENCOUNTER — Encounter (HOSPITAL_COMMUNITY): Payer: Self-pay | Admitting: *Deleted

## 2012-12-11 ENCOUNTER — Emergency Department (HOSPITAL_COMMUNITY)
Admission: EM | Admit: 2012-12-11 | Discharge: 2012-12-12 | Disposition: A | Discharge status: Home / Self Care | Payer: Medicare Other | Attending: Emergency Medicine | Admitting: Emergency Medicine

## 2012-12-11 DIAGNOSIS — B9789 Other viral agents as the cause of diseases classified elsewhere: Secondary | ICD-10-CM | POA: Insufficient documentation

## 2012-12-11 DIAGNOSIS — J069 Acute upper respiratory infection, unspecified: Secondary | ICD-10-CM | POA: Insufficient documentation

## 2012-12-11 DIAGNOSIS — Z79899 Other long term (current) drug therapy: Secondary | ICD-10-CM | POA: Insufficient documentation

## 2012-12-11 DIAGNOSIS — K1370 Unspecified lesions of oral mucosa: Secondary | ICD-10-CM | POA: Insufficient documentation

## 2012-12-11 DIAGNOSIS — Z87891 Personal history of nicotine dependence: Secondary | ICD-10-CM | POA: Insufficient documentation

## 2012-12-11 DIAGNOSIS — R509 Fever, unspecified: Secondary | ICD-10-CM | POA: Insufficient documentation

## 2012-12-11 DIAGNOSIS — I10 Essential (primary) hypertension: Secondary | ICD-10-CM | POA: Insufficient documentation

## 2012-12-11 DIAGNOSIS — J3489 Other specified disorders of nose and nasal sinuses: Secondary | ICD-10-CM | POA: Insufficient documentation

## 2012-12-11 DIAGNOSIS — J45909 Unspecified asthma, uncomplicated: Secondary | ICD-10-CM | POA: Insufficient documentation

## 2012-12-11 DIAGNOSIS — B349 Viral infection, unspecified: Secondary | ICD-10-CM

## 2012-12-11 DIAGNOSIS — E785 Hyperlipidemia, unspecified: Secondary | ICD-10-CM | POA: Insufficient documentation

## 2012-12-11 DIAGNOSIS — Z8619 Personal history of other infectious and parasitic diseases: Secondary | ICD-10-CM | POA: Insufficient documentation

## 2012-12-11 NOTE — ED Notes (Signed)
Pt thinks she caught something from her granddaughter,  She has had runny nose,  Coughing,  Congestion , fever and body aches since last night,  Pt is alert and oriented in NAD

## 2012-12-12 ENCOUNTER — Emergency Department (HOSPITAL_COMMUNITY): Payer: Medicare Other

## 2012-12-12 MED ORDER — IBUPROFEN 200 MG PO TABS
400.0000 mg | ORAL_TABLET | Freq: Four times a day (QID) | ORAL | Status: DC | PRN
  Administered 2012-12-12: 400 mg via ORAL
  Filled 2012-12-12: qty 2

## 2012-12-12 MED ORDER — CEPASTAT 14.5 MG MT LOZG: 1.0000 | LOZENGE | OROMUCOSAL | Status: DC | PRN

## 2012-12-12 MED ORDER — IBUPROFEN 400 MG PO TABS: 400.0000 mg | ORAL_TABLET | Freq: Four times a day (QID) | ORAL | Status: DC | PRN

## 2012-12-12 MED ORDER — BENZONATATE 100 MG PO CAPS: 200.0000 mg | ORAL_CAPSULE | Freq: Three times a day (TID) | ORAL | Status: DC | PRN

## 2012-12-12 MED ORDER — PSEUDOEPHEDRINE HCL ER 120 MG PO TB12
120.0000 mg | ORAL_TABLET | Freq: Two times a day (BID) | ORAL | Status: DC
  Administered 2012-12-12: 120 mg via ORAL
  Filled 2012-12-12: qty 1

## 2012-12-12 MED ORDER — CEPASTAT 14.5 MG MT LOZG
1.0000 | LOZENGE | OROMUCOSAL | Status: DC | PRN
  Filled 2012-12-12: qty 18

## 2012-12-12 MED ORDER — PSEUDOEPHEDRINE HCL ER 120 MG PO TB12: 120.0000 mg | ORAL_TABLET | Freq: Two times a day (BID) | ORAL | Status: DC

## 2012-12-12 MED ORDER — BENZONATATE 200 MG PO CAPS: 200.0000 mg | ORAL_CAPSULE | Freq: Three times a day (TID) | ORAL | Status: DC | PRN

## 2012-12-12 NOTE — ED Provider Notes (Signed)
History     CSN: [id number]  Arrival date & time 12/11/12  2134   First MD Initiated Contact with Patient 12/12/12 0011      Chief Complaint  Patient presents with  . Generalized Body Aches  . Fever  . Nasal Congestion    (Consider location/radiation/quality/duration/timing/severity/associated sxs/prior treatment) HPI 47 year old female presents to emergency room complaining of one day of runny nose, sneezing, congestion, coughing, subjective fever, and chills.  Patient has history of asthma.  She feels that she caught something from her granddaughter, who she has been babysitting for the last 2 days.  Past Medical History  Diagnosis Date  . Hepatitis C   . Hyperlipidemia   . Asthma   . Herpes simplex type 2 infection   . Recurrent mouth ulceration   . Drug addiction   . Hypertension     Past Surgical History  Procedure Laterality Date  . Abdominal hysterectomy    . Cholecystectomy      Family History  Problem Relation Age of Onset  . Emphysema Father   . Heart disease Father   . Clotting disorder Father     History  Substance Use Topics  . Smoking status: Former Smoker -- 0.50 packs/day for 30 years    Types: Cigarettes    Quit date: 11/05/2006  . Smokeless tobacco: Never Used  . Alcohol Use: No    OB History   Grav Para Term Preterm Abortions TAB SAB Ect Mult Living                  Review of Systems  See History of Present Illness; otherwise all other systems are reviewed and negative Allergies  Review of patient's allergies indicates no known allergies.  Home Medications   Current Outpatient Rx  Name  Route  Sig  Dispense  Refill  . amLODipine-olmesartan (AZOR) 5-20 MG per tablet   Oral   Take 1 tablet by mouth daily.   30 tablet   11   . dexlansoprazole (DEXILANT) 60 MG capsule   Oral   Take 60 mg by mouth daily.         Marland Kitchen EXPIRED: famotidine (PEPCID) 20 MG tablet   Oral   Take 20 mg by mouth at bedtime.          .  mometasone-formoterol (DULERA) 200-5 MCG/ACT AERO   Inhalation   Inhale 2 puffs into the lungs every 12 (twelve) hours.   13 g   11   . EXPIRED: simvastatin (ZOCOR) 10 MG tablet   Oral   Take 1 tablet (10 mg total) by mouth at bedtime.         . traMADol (ULTRAM) 50 MG tablet      1-2 every 4 hours as needed for cough or pain   40 tablet   0   . valACYclovir (VALTREX) 1000 MG tablet   Oral   Take 1,000 mg by mouth 2 (two) times daily.            BP 114/79  Pulse 104  Temp(Src) 99.9 F (37.7 C) (Oral)  Resp 21  Ht 5' (1.524 m)  Wt 155 lb (70.308 kg)  BMI 30.27 kg/m2  SpO2 94%  Physical Exam  Nursing note and vitals reviewed. Constitutional: She is oriented to person, place, and time. She appears well-developed and well-nourished.  HENT:  Head: Normocephalic and atraumatic.  Right Ear: External ear normal.  Left Ear: External ear normal.  Mouth/Throat: Oropharynx is clear and moist.  Rhinorrhea and nasal congestion  Eyes: Conjunctivae and EOM are normal. Pupils are equal, round, and reactive to light.  Neck: Normal range of motion. Neck supple. No JVD present. No tracheal deviation present. No thyromegaly present.  Cardiovascular: Normal rate, regular rhythm, normal heart sounds and intact distal pulses.  Exam reveals no gallop and no friction rub.   No murmur heard. Pulmonary/Chest: Effort normal and breath sounds normal. No stridor. No respiratory distress. She has no wheezes. She has no rales. She exhibits no tenderness.  Coughing  Abdominal: Soft. Bowel sounds are normal. She exhibits no distension and no mass. There is no tenderness. There is no rebound and no guarding.  Musculoskeletal: Normal range of motion. She exhibits no edema and no tenderness.  Lymphadenopathy:    She has no cervical adenopathy.  Neurological: She is alert and oriented to person, place, and time. She exhibits normal muscle tone. Coordination normal.  Skin: Skin is warm and dry. No  rash noted. No erythema. No pallor.  Psychiatric: She has a normal mood and affect. Her behavior is normal. Judgment and thought content normal.    ED Course  Procedures (including critical care time)  Labs Reviewed - No data to display Dg Chest 2 View  12/12/2012   *RADIOLOGY REPORT*  Clinical Data: Cough, congestion, shortness of breath, fever and mid chest pain.  CHEST - 2 VIEW  Comparison: None.  Findings: The lungs are well-aerated and clear.  There is no evidence of focal opacification, pleural effusion or pneumothorax.  The heart is normal in size; the mediastinal contour is within normal limits.  No acute osseous abnormalities are seen.  Clips are noted about the gastroesophageal junction.  IMPRESSION: No acute cardiopulmonary process seen.   Original Report Authenticated By: Tonia Ghent, M.D.     1. URI (upper respiratory infection)   2. Viral infection       MDM  61-year-old female with one day of upper respiratory infection/viral infection.  Will just check x-ray given history of asthma and tracheobronchitis.  Expect patient will be able to go home with symptom management.  Do not feel she requires time off work.  Patient counseled on good handwashing technique        Olivia Mackie, MD 12/12/12 0111

## 2012-12-12 NOTE — ED Notes (Signed)
Patient transported to X-ray 

## 2012-12-21 ENCOUNTER — Emergency Department (HOSPITAL_COMMUNITY)
Admission: EM | Admit: 2012-12-21 | Discharge: 2012-12-21 | Disposition: A | Discharge status: Home / Self Care | Payer: Medicare Other | Attending: Emergency Medicine | Admitting: Emergency Medicine

## 2012-12-21 DIAGNOSIS — Z8719 Personal history of other diseases of the digestive system: Secondary | ICD-10-CM | POA: Insufficient documentation

## 2012-12-21 DIAGNOSIS — M542 Cervicalgia: Secondary | ICD-10-CM | POA: Insufficient documentation

## 2012-12-21 DIAGNOSIS — Z79899 Other long term (current) drug therapy: Secondary | ICD-10-CM | POA: Insufficient documentation

## 2012-12-21 DIAGNOSIS — IMO0002 Reserved for concepts with insufficient information to code with codable children: Secondary | ICD-10-CM | POA: Insufficient documentation

## 2012-12-21 DIAGNOSIS — M436 Torticollis: Secondary | ICD-10-CM | POA: Insufficient documentation

## 2012-12-21 DIAGNOSIS — I10 Essential (primary) hypertension: Secondary | ICD-10-CM | POA: Insufficient documentation

## 2012-12-21 DIAGNOSIS — J45909 Unspecified asthma, uncomplicated: Secondary | ICD-10-CM | POA: Insufficient documentation

## 2012-12-21 DIAGNOSIS — Z87891 Personal history of nicotine dependence: Secondary | ICD-10-CM | POA: Insufficient documentation

## 2012-12-21 DIAGNOSIS — E785 Hyperlipidemia, unspecified: Secondary | ICD-10-CM | POA: Insufficient documentation

## 2012-12-21 DIAGNOSIS — Z8619 Personal history of other infectious and parasitic diseases: Secondary | ICD-10-CM | POA: Insufficient documentation

## 2012-12-21 DIAGNOSIS — G243 Spasmodic torticollis: Secondary | ICD-10-CM

## 2012-12-21 MED ORDER — METHOCARBAMOL 500 MG PO TABS: 500.0000 mg | ORAL_TABLET | Freq: Two times a day (BID) | ORAL | Status: DC

## 2012-12-21 MED ORDER — METHOCARBAMOL 500 MG PO TABS
500.0000 mg | ORAL_TABLET | Freq: Once | ORAL | Status: AC
  Administered 2012-12-21: 500 mg via ORAL
  Filled 2012-12-21: qty 1

## 2012-12-21 NOTE — ED Provider Notes (Signed)
History  This chart was scribed for non-physician practitioner working with Gerhard Munch, MD by Greggory Stallion, ED scribe. This patient was seen in room WTR8/WTR8 and the patient's care was started at 9:24 PM.  CSN: [id number]  Arrival date & time 12/21/12  2120    No chief complaint on file.   The history is provided by the patient. No language interpreter was used.    HPI Comments: Brittany Sparks is a 47 y.o. female who presents to the Emergency Department complaining of gradual onset, gradually worsening neck spasms. Pt states she thinks they are due to putting too many pillows behind her head last night. Pt states she is not able to move her neck. She states she took Motrin 800 mg at noon today and used [facility] with little relief. Pt denies fever, sore throat, visual disturbance, CP, cough, SOB, abdominal pain, nausea, emesis, diarrhea, urinary symptoms, back pain, HA, weakness, numbness and rash as associated symptoms.    Past Medical History  Diagnosis Date  . Hepatitis C   . Hyperlipidemia   . Asthma   . Herpes simplex type 2 infection   . Recurrent mouth ulceration   . Drug addiction   . Hypertension     Past Surgical History  Procedure Laterality Date  . Abdominal hysterectomy    . Cholecystectomy      Family History  Problem Relation Age of Onset  . Emphysema Father   . Heart disease Father   . Clotting disorder Father     History  Substance Use Topics  . Smoking status: Former Smoker -- 0.50 packs/day for 30 years    Types: Cigarettes    Quit date: 11/05/2006  . Smokeless tobacco: Never Used  . Alcohol Use: No    OB History   Grav Para Term Preterm Abortions TAB SAB Ect Mult Living                  Review of Systems  Constitutional: Negative for fever.  HENT: Positive for neck pain. Negative for sore throat.   Eyes: Negative for visual disturbance.  Respiratory: Negative for cough and shortness of breath.   Cardiovascular: Negative for chest  pain.  Gastrointestinal: Negative for nausea, vomiting, abdominal pain and diarrhea.  Genitourinary: Negative for difficulty urinating.  Musculoskeletal: Negative for back pain.  Skin: Negative for rash.  Neurological: Negative for numbness.  All other systems reviewed and are negative.    Allergies  Review of patient's allergies indicates no known allergies.  Home Medications   Current Outpatient Rx  Name  Route  Sig  Dispense  Refill  . amLODipine-olmesartan (AZOR) 5-20 MG per tablet   Oral   Take 1 tablet by mouth every morning.         . benzonatate (TESSALON) 200 MG capsule   Oral   Take 1 capsule (200 mg total) by mouth 3 (three) times daily as needed for cough.   20 capsule   0   . dexlansoprazole (DEXILANT) 60 MG capsule   Oral   Take 60 mg by mouth every morning.          Marland Kitchen ibuprofen (ADVIL,MOTRIN) 400 MG tablet   Oral   Take 1 tablet (400 mg total) by mouth every 6 (six) hours as needed.   30 tablet   0   . mometasone-formoterol (DULERA) 200-5 MCG/ACT AERO   Inhalation   Inhale 2 puffs into the lungs every 12 (twelve) hours.   13 g  11   . phenol-menthol (CEPASTAT) 14.5 MG lozenge   Oral   Take 1 lozenge by mouth every 2 (two) hours as needed.   30 tablet   0   . pseudoephedrine (SUDAFED) 120 MG 12 hr tablet   Oral   Take 1 tablet (120 mg total) by mouth 2 (two) times daily.   30 tablet   0   . simvastatin (ZOCOR) 10 MG tablet   Oral   Take 10 mg by mouth at bedtime.         . valACYclovir (VALTREX) 1000 MG tablet   Oral   Take 1,000 mg by mouth 2 (two) times daily.            BP 107/65  Pulse 64  Temp(Src) 98.8 F (37.1 C) (Oral)  Resp 15  SpO2 98%  Physical Exam  Nursing note and vitals reviewed. Constitutional: She is oriented to person, place, and time. She appears well-developed and well-nourished. No distress.  Non toxic to appearance. Normal speech pattern. Alert and oriented  HENT:  Head: Normocephalic and  atraumatic.  Eyes: EOM are normal.  Neck: Neck supple. No tracheal deviation present.  Tenderness to right later aspects of trapezius muscle without evidence of mass, tumor, abscess or rash. No nuchal rigidity.   Cardiovascular: Normal rate.   Pulmonary/Chest: Effort normal. No respiratory distress.  Musculoskeletal: Normal range of motion.  No significant midline spinal tenderness.   Neurological: She is alert and oriented to person, place, and time. Gait normal. GCS eye subscore is 4. GCS verbal subscore is 5. GCS motor subscore is 6.  Skin: Skin is warm and dry.  Psychiatric: She has a normal mood and affect. Her behavior is normal.    ED Course  Procedures (including critical care time)  DIAGNOSTIC STUDIES: Oxygen Saturation is 98% on RA, normal by my interpretation.    COORDINATION OF CARE: 9:37 PM-Discussed treatment plan which includes prescription for a muscle relaxer with pt at bedside and pt agreed to plan. Alerted pt she has nothing wrong with her neck such as mass, abscess, or rash.  Labs Reviewed - No data to display No results found.   1. Torticollis, spasmodic       MDM  BP 107/65  Pulse 64  Temp(Src) 98.8 F (37.1 C) (Oral)  Resp 15  SpO2 98%   I personally performed the services described in this documentation, which was scribed in my presence. The recorded information has been reviewed and is accurate.    Fayrene Helper, PA-C 12/21/12 2143

## 2012-12-21 NOTE — ED Notes (Signed)
Pt states she woke up with a "crook" in her neck this morning. States she thinks she may have slept on too many pillows. Pt states that pain to back of neck is getting worse and now she has spasms to the back of her neck. Pt states she took Motrin 800 at noon today and used [facility], with little relief. Pt ambulatory to exam room with steady gait. Pt arrives with companion.

## 2012-12-21 NOTE — ED Provider Notes (Signed)
  Medical screening examination/treatment/procedure(s) were performed by non-physician practitioner and as supervising physician I was immediately available for consultation/collaboration.    Sun Wilensky, MD 12/21/12 2345 

## 2012-12-22 ENCOUNTER — Emergency Department (HOSPITAL_COMMUNITY)
Admission: EM | Admit: 2012-12-22 | Discharge: 2012-12-22 | Disposition: A | Discharge status: Home / Self Care | Payer: Medicare Other | Attending: Emergency Medicine | Admitting: Emergency Medicine

## 2012-12-22 ENCOUNTER — Encounter (HOSPITAL_COMMUNITY): Payer: Self-pay | Admitting: *Deleted

## 2012-12-22 DIAGNOSIS — I10 Essential (primary) hypertension: Secondary | ICD-10-CM | POA: Insufficient documentation

## 2012-12-22 DIAGNOSIS — Z8619 Personal history of other infectious and parasitic diseases: Secondary | ICD-10-CM | POA: Insufficient documentation

## 2012-12-22 DIAGNOSIS — E785 Hyperlipidemia, unspecified: Secondary | ICD-10-CM | POA: Insufficient documentation

## 2012-12-22 DIAGNOSIS — M542 Cervicalgia: Secondary | ICD-10-CM | POA: Insufficient documentation

## 2012-12-22 DIAGNOSIS — Z8719 Personal history of other diseases of the digestive system: Secondary | ICD-10-CM | POA: Insufficient documentation

## 2012-12-22 DIAGNOSIS — M62838 Other muscle spasm: Secondary | ICD-10-CM | POA: Insufficient documentation

## 2012-12-22 DIAGNOSIS — J45909 Unspecified asthma, uncomplicated: Secondary | ICD-10-CM | POA: Insufficient documentation

## 2012-12-22 DIAGNOSIS — IMO0002 Reserved for concepts with insufficient information to code with codable children: Secondary | ICD-10-CM | POA: Insufficient documentation

## 2012-12-22 DIAGNOSIS — Z79899 Other long term (current) drug therapy: Secondary | ICD-10-CM | POA: Insufficient documentation

## 2012-12-22 DIAGNOSIS — Z87891 Personal history of nicotine dependence: Secondary | ICD-10-CM | POA: Insufficient documentation

## 2012-12-22 MED ORDER — HYDROMORPHONE HCL PF 1 MG/ML IJ SOLN
1.0000 mg | Freq: Once | INTRAMUSCULAR | Status: AC
  Administered 2012-12-22: 1 mg via INTRAMUSCULAR
  Filled 2012-12-22: qty 1

## 2012-12-22 MED ORDER — DIAZEPAM 5 MG PO TABS
5.0000 mg | ORAL_TABLET | Freq: Once | ORAL | Status: AC
  Administered 2012-12-22: 5 mg via ORAL
  Filled 2012-12-22: qty 1

## 2012-12-22 MED ORDER — CYCLOBENZAPRINE HCL 10 MG PO TABS: 10.0000 mg | ORAL_TABLET | Freq: Two times a day (BID) | ORAL | Status: DC | PRN

## 2012-12-22 MED ORDER — KETOROLAC TROMETHAMINE 60 MG/2ML IM SOLN
60.0000 mg | Freq: Once | INTRAMUSCULAR | Status: AC
  Administered 2012-12-22: 60 mg via INTRAMUSCULAR
  Filled 2012-12-22: qty 2

## 2012-12-22 MED ORDER — NAPROXEN 500 MG PO TABS: 500.0000 mg | ORAL_TABLET | Freq: Two times a day (BID) | ORAL | Status: DC

## 2012-12-22 NOTE — ED Notes (Signed)
Pt presents to ed with c/o neck pain and spasms, was seen here last night for same, prescribed robaxin sts no relief.

## 2012-12-22 NOTE — ED Provider Notes (Signed)
History     CSN: [id number]  Arrival date & time 12/22/12  1015   First MD Initiated Contact with Patient 12/22/12 1024      No chief complaint on file.   (Consider location/radiation/quality/duration/timing/severity/associated sxs/prior treatment) HPI Comments: Patient is a 47 year old female who presents with neck pain for the past 2 days. Symptoms started gradually and progressively worsened since the onset. The pain is aching and severe without radiation. Patient was seen in the ED yesterday for the same pain. She was prescribed Robaxin yesterday which did not provide relief. She reports associated neck spasm. No aggravating/alleviating factors. No other associated symptoms. Patient denies any injury or IV drug.    Past Medical History  Diagnosis Date  . Hepatitis C   . Hyperlipidemia   . Asthma   . Herpes simplex type 2 infection   . Recurrent mouth ulceration   . Drug addiction   . Hypertension     Past Surgical History  Procedure Laterality Date  . Abdominal hysterectomy    . Cholecystectomy      Family History  Problem Relation Age of Onset  . Emphysema Father   . Heart disease Father   . Clotting disorder Father     History  Substance Use Topics  . Smoking status: Former Smoker -- 0.50 packs/day for 30 years    Types: Cigarettes    Quit date: 11/05/2006  . Smokeless tobacco: Never Used  . Alcohol Use: No    OB History   Grav Para Term Preterm Abortions TAB SAB Ect Mult Living                  Review of Systems  HENT: Positive for neck pain.   All other systems reviewed and are negative.    Allergies  Review of patient's allergies indicates no known allergies.  Home Medications   Current Outpatient Rx  Name  Route  Sig  Dispense  Refill  . amLODipine-olmesartan (AZOR) 5-20 MG per tablet   Oral   Take 1 tablet by mouth every morning.         . benzonatate (TESSALON) 200 MG capsule   Oral   Take 1 capsule (200 mg total) by mouth 3  (three) times daily as needed for cough.   20 capsule   0   . dexlansoprazole (DEXILANT) 60 MG capsule   Oral   Take 60 mg by mouth every morning.          Marland Kitchen ibuprofen (ADVIL,MOTRIN) 400 MG tablet   Oral   Take 1 tablet (400 mg total) by mouth every 6 (six) hours as needed.   30 tablet   0   . methocarbamol (ROBAXIN) 500 MG tablet   Oral   Take 1 tablet (500 mg total) by mouth 2 (two) times daily.   20 tablet   0   . mometasone-formoterol (DULERA) 200-5 MCG/ACT AERO   Inhalation   Inhale 2 puffs into the lungs every 12 (twelve) hours.   13 g   11   . phenol-menthol (CEPASTAT) 14.5 MG lozenge   Oral   Take 1 lozenge by mouth every 2 (two) hours as needed.   30 tablet   0   . pseudoephedrine (SUDAFED) 120 MG 12 hr tablet   Oral   Take 1 tablet (120 mg total) by mouth 2 (two) times daily.   30 tablet   0   . simvastatin (ZOCOR) 10 MG tablet   Oral  Take 10 mg by mouth at bedtime.         . valACYclovir (VALTREX) 1000 MG tablet   Oral   Take 1,000 mg by mouth 2 (two) times daily.            BP 134/74  Pulse 78  Temp(Src) 98.2 F (36.8 C) (Oral)  Resp 20  SpO2 100%  Physical Exam  Nursing note and vitals reviewed. Constitutional: She is oriented to person, place, and time. She appears well-developed and well-nourished. No distress.  HENT:  Head: Normocephalic and atraumatic.  Eyes: Conjunctivae are normal.  Neck:  ROM limited due to pain. Right lateral neck tenderness to palpation. No obvious deformity. No c-spine tenderness to palpation.   Cardiovascular: Normal rate and regular rhythm.  Exam reveals no gallop and no friction rub.   No murmur heard. Pulmonary/Chest: Effort normal and breath sounds normal. She has no wheezes. She has no rales. She exhibits no tenderness.  Abdominal: Soft. There is no tenderness.  Musculoskeletal: Normal range of motion.  Neurological: She is alert and oriented to person, place, and time. Coordination normal.   Speech is goal-oriented. Moves limbs without ataxia.   Skin: Skin is warm and dry.  Psychiatric: She has a normal mood and affect. Her behavior is normal.    ED Course  Procedures (including critical care time)  Labs Reviewed - No data to display No results found.   1. Neck pain on right side       MDM  11:26 AM Patient given toradol and valium for pain. Patient will have IM dilaudid for pain because the previous medicine did not work. I will discharge the patient with Flexeril and Naprosyn for pain. Patient instructed to apply heat and follow up with PCP.        Emilia Beck, PA-C 12/22/12 1529

## 2012-12-22 NOTE — ED Provider Notes (Signed)
Medical screening examination/treatment/procedure(s) were performed by non-physician practitioner and as supervising physician I was immediately available for consultation/collaboration.  Raeford Razor, MD 12/22/12 1620

## 2013-01-05 ENCOUNTER — Other Ambulatory Visit: Payer: Self-pay | Admitting: Nurse Practitioner

## 2013-01-05 DIAGNOSIS — Z1231 Encounter for screening mammogram for malignant neoplasm of breast: Secondary | ICD-10-CM

## 2013-01-20 ENCOUNTER — Ambulatory Visit: Payer: Medicare Other

## 2013-02-07 ENCOUNTER — Observation Stay (HOSPITAL_COMMUNITY)
Admission: EM | Admit: 2013-02-07 | Discharge: 2013-02-08 | Disposition: A | Discharge status: Home / Self Care | Payer: Medicare Other | Attending: Internal Medicine | Admitting: Internal Medicine

## 2013-02-07 ENCOUNTER — Encounter (HOSPITAL_COMMUNITY): Payer: Self-pay | Admitting: Emergency Medicine

## 2013-02-07 ENCOUNTER — Emergency Department (HOSPITAL_COMMUNITY): Payer: Medicare Other

## 2013-02-07 DIAGNOSIS — E785 Hyperlipidemia, unspecified: Secondary | ICD-10-CM

## 2013-02-07 DIAGNOSIS — B1920 Unspecified viral hepatitis C without hepatic coma: Secondary | ICD-10-CM | POA: Insufficient documentation

## 2013-02-07 DIAGNOSIS — J449 Chronic obstructive pulmonary disease, unspecified: Secondary | ICD-10-CM

## 2013-02-07 DIAGNOSIS — J4489 Other specified chronic obstructive pulmonary disease: Secondary | ICD-10-CM | POA: Insufficient documentation

## 2013-02-07 DIAGNOSIS — I10 Essential (primary) hypertension: Secondary | ICD-10-CM | POA: Insufficient documentation

## 2013-02-07 DIAGNOSIS — R079 Chest pain, unspecified: Secondary | ICD-10-CM | POA: Insufficient documentation

## 2013-02-07 DIAGNOSIS — K56609 Unspecified intestinal obstruction, unspecified as to partial versus complete obstruction: Secondary | ICD-10-CM | POA: Insufficient documentation

## 2013-02-07 DIAGNOSIS — R0602 Shortness of breath: Principal | ICD-10-CM | POA: Diagnosis present

## 2013-02-07 DIAGNOSIS — R0789 Other chest pain: Secondary | ICD-10-CM | POA: Diagnosis present

## 2013-02-07 LAB — CBC WITH DIFFERENTIAL/PLATELET
Basophils Absolute: 0 10*3/uL (ref 0.0–0.1)
Basophils Relative: 0 % (ref 0–1)
Eosinophils Relative: 1 % (ref 0–5)
HCT: 36.2 % (ref 36.0–46.0)
MCHC: 32.6 g/dL (ref 30.0–36.0)
MCV: 94.8 fL (ref 78.0–100.0)
Monocytes Absolute: 0.5 10*3/uL (ref 0.1–1.0)
Neutro Abs: 3 10*3/uL (ref 1.7–7.7)
Platelets: 306 10*3/uL (ref 150–400)
RDW: 13 % (ref 11.5–15.5)

## 2013-02-07 LAB — COMPREHENSIVE METABOLIC PANEL
ALT: 31 U/L (ref 0–35)
AST: 46 U/L — ABNORMAL HIGH (ref 0–37)
BUN: 13 mg/dL (ref 6–23)
BUN: 15 mg/dL (ref 6–23)
CO2: 27 mEq/L (ref 19–32)
CO2: 28 mEq/L (ref 19–32)
Calcium: 9.1 mg/dL (ref 8.4–10.5)
Chloride: 101 mEq/L (ref 96–112)
Creatinine, Ser: 0.81 mg/dL (ref 0.50–1.10)
Creatinine, Ser: 0.73 mg/dL (ref 0.50–1.10)
GFR calc Af Amer: >90 mL/min (ref >90)
GFR calc non Af Amer: 86 mL/min — ABNORMAL LOW (ref >90)
GFR calc non Af Amer: >90 mL/min (ref >90)
Glucose, Bld: 121 mg/dL — ABNORMAL HIGH (ref 70–99)
Glucose, Bld: 142 mg/dL — ABNORMAL HIGH (ref 70–99)
Sodium: 140 mEq/L (ref 135–145)
Total Bilirubin: 0.3 mg/dL (ref 0.3–1.2)

## 2013-02-07 LAB — CBC
HCT: 37.7 % (ref 36.0–46.0)
Hemoglobin: 12.3 g/dL (ref 12.0–15.0)
MCH: 31.1 pg (ref 26.0–34.0)
MCHC: 32.6 g/dL (ref 30.0–36.0)
MCV: 95.2 fL (ref 78.0–100.0)

## 2013-02-07 LAB — MAGNESIUM: Magnesium: 2.1 mg/dL (ref 1.5–2.5)

## 2013-02-07 LAB — POCT I-STAT TROPONIN I: Troponin i, poc: 0 ng/mL (ref 0.00–0.08)

## 2013-02-07 LAB — APTT: aPTT: 27 seconds (ref 24–37)

## 2013-02-07 MED ORDER — SIMVASTATIN 10 MG PO TABS
10.0000 mg | ORAL_TABLET | Freq: Every day | ORAL | Status: DC
  Administered 2013-02-08: 10 mg via ORAL
  Filled 2013-02-07 (×3): qty 1

## 2013-02-07 MED ORDER — HYDROCODONE-ACETAMINOPHEN 5-325 MG PO TABS
1.0000 | ORAL_TABLET | ORAL | Status: DC | PRN
  Administered 2013-02-07 – 2013-02-08 (×2): 2 via ORAL
  Filled 2013-02-07 (×2): qty 2

## 2013-02-07 MED ORDER — FAMOTIDINE 20 MG PO TABS
20.0000 mg | ORAL_TABLET | Freq: Every day | ORAL | Status: DC
  Administered 2013-02-07: 20 mg via ORAL
  Filled 2013-02-07 (×2): qty 1

## 2013-02-07 MED ORDER — ACETAMINOPHEN 325 MG PO TABS: 650.0000 mg | ORAL_TABLET | Freq: Four times a day (QID) | ORAL | Status: DC | PRN

## 2013-02-07 MED ORDER — ALBUTEROL SULFATE (5 MG/ML) 0.5% IN NEBU: 2.5000 mg | INHALATION_SOLUTION | RESPIRATORY_TRACT | Status: DC | PRN

## 2013-02-07 MED ORDER — NITROGLYCERIN 0.4 MG SL SUBL
0.4000 mg | SUBLINGUAL_TABLET | SUBLINGUAL | Status: DC | PRN
  Administered 2013-02-07 (×2): 0.4 mg via SUBLINGUAL
  Filled 2013-02-07: qty 25

## 2013-02-07 MED ORDER — AMLODIPINE BESYLATE 5 MG PO TABS
5.0000 mg | ORAL_TABLET | Freq: Every day | ORAL | Status: DC
  Filled 2013-02-07: qty 1

## 2013-02-07 MED ORDER — NITROGLYCERIN 2 % TD OINT
1.0000 [in_us] | TOPICAL_OINTMENT | Freq: Once | TRANSDERMAL | Status: AC
  Administered 2013-02-07: 1 [in_us] via TOPICAL
  Filled 2013-02-07: qty 30

## 2013-02-07 MED ORDER — IPRATROPIUM BROMIDE 0.02 % IN SOLN: 0.5000 mg | RESPIRATORY_TRACT | Status: DC | PRN

## 2013-02-07 MED ORDER — IRBESARTAN 150 MG PO TABS
150.0000 mg | ORAL_TABLET | Freq: Every day | ORAL | Status: DC
  Filled 2013-02-07: qty 1

## 2013-02-07 MED ORDER — PANTOPRAZOLE SODIUM 40 MG PO TBEC
40.0000 mg | DELAYED_RELEASE_TABLET | Freq: Every day | ORAL | Status: DC
  Administered 2013-02-08 (×2): 40 mg via ORAL
  Filled 2013-02-07 (×3): qty 1

## 2013-02-07 MED ORDER — AMLODIPINE-OLMESARTAN 5-20 MG PO TABS: 1.0000 | ORAL_TABLET | Freq: Every morning | ORAL | Status: DC

## 2013-02-07 MED ORDER — ONDANSETRON HCL 4 MG/2ML IJ SOLN: 4.0000 mg | Freq: Four times a day (QID) | INTRAMUSCULAR | Status: DC | PRN

## 2013-02-07 MED ORDER — ACETAMINOPHEN 650 MG RE SUPP: 650.0000 mg | Freq: Four times a day (QID) | RECTAL | Status: DC | PRN

## 2013-02-07 MED ORDER — VALACYCLOVIR HCL 500 MG PO TABS
1000.0000 mg | ORAL_TABLET | Freq: Two times a day (BID) | ORAL | Status: DC
  Administered 2013-02-08 (×2): 1000 mg via ORAL
  Filled 2013-02-07 (×4): qty 2

## 2013-02-07 MED ORDER — ASPIRIN 81 MG PO CHEW
324.0000 mg | CHEWABLE_TABLET | Freq: Once | ORAL | Status: AC
  Administered 2013-02-07: 324 mg via ORAL
  Filled 2013-02-07: qty 1

## 2013-02-07 MED ORDER — SODIUM CHLORIDE 0.9 % IJ SOLN
3.0000 mL | Freq: Two times a day (BID) | INTRAMUSCULAR | Status: DC
  Administered 2013-02-07: 3 mL via INTRAVENOUS

## 2013-02-07 MED ORDER — ONDANSETRON HCL 4 MG PO TABS: 4.0000 mg | ORAL_TABLET | Freq: Four times a day (QID) | ORAL | Status: DC | PRN

## 2013-02-07 MED ORDER — SODIUM CHLORIDE 0.9 % IV SOLN
INTRAVENOUS | Status: DC
  Administered 2013-02-07: via INTRAVENOUS

## 2013-02-07 MED ORDER — MORPHINE SULFATE 2 MG/ML IJ SOLN: 1.0000 mg | INTRAMUSCULAR | Status: DC | PRN

## 2013-02-07 MED ORDER — MOMETASONE FURO-FORMOTEROL FUM 200-5 MCG/ACT IN AERO
2.0000 | INHALATION_SPRAY | Freq: Two times a day (BID) | RESPIRATORY_TRACT | Status: DC
  Administered 2013-02-08: 2 via RESPIRATORY_TRACT
  Filled 2013-02-07: qty 8.8

## 2013-02-07 NOTE — H&P (Signed)
[facility] History and Physical  Brittany Sparks ZOX:[id number] DOB: 1965/08/28 DOA: 02/07/2013  Referring physician: ER physician PCP: [name], FNP   Chief Complaint: chest pain and shortness of breath  HPI:  47 year old female with past medical history of COPD, hypertension, small bowel obstruction who presented to [facility] ED for evaluation of an ongoing shortness of breath on exertion for past 10 days prior to this admission. Patient reports shortness of breath improves with rest. She only started experiencing chest pain today prior to the admission which was retrosternal, 6-7/10 in intensity and relieved with nitroglycerin. Pain has started with ambulation and was non radiating. No associated palpitations. No fever or chills. No abdominal pain, no nausea or vomiting. No reports of blood in stool or urine.  In ED, vitals were stable with BP of 105/71, HR 65 and O2 saturation of 97% on 2 L nasal canula. T max was 98.9 F. The 12 lead EKG showed sinus arrhythmia. CBC and BMP were unremarkable and so was the CXR. The first set of cardiac enzymes was within normal limits.  Assessment and Plan:  Principal Problem:   Shortness of breath - respiratory status is stable at this time and patient is saturating 97% on 2 L nasal canula - CXR shows no active disease - likely due to COPD - obtain 2 D ECHO - use albuterol and atrovent as needed - continue dulera inhaler Active Problems:   Chest pain - rule out ACS - cycle cardiac enzymes - follow up results of 2 D EHCO - analgesia with morphine IV 1 mg every 4 hours as needed   COPD  - continue dulera inhaler -add albuterol and atrovent as needed every 4 hours   HTN (hypertension) - continue Azor  Manson Passey [facility] [phone]  Review of Systems:  Constitutional: Negative for fever, chills and malaise/fatigue. Negative for diaphoresis.  HENT: Negative for hearing loss, ear pain, nosebleeds, congestion, sore throat, neck pain, tinnitus  and ear discharge.   Eyes: Negative for blurred vision, double vision, photophobia, pain, discharge and redness.  Respiratory: Negative for cough, hemoptysis, positive for shortness of breath, no wheezing and stridor.   Cardiovascular: Positive for chest pain, no palpitations, orthopnea, claudication and leg swelling.  Gastrointestinal: Negative for nausea, vomiting and abdominal pain. Negative for heartburn, constipation, blood in stool and melena.  Genitourinary: Negative for dysuria, urgency, frequency, hematuria and flank pain.  Musculoskeletal: Negative for myalgias, back pain, joint pain and falls.  Skin: Negative for itching and rash.  Neurological: Negative for dizziness and weakness. Negative for tingling, tremors, sensory change, speech change, focal weakness, loss of consciousness and headaches.  Endo/Heme/Allergies: Negative for environmental allergies and polydipsia. Does not bruise/bleed easily.  Psychiatric/Behavioral: Negative for suicidal ideas. The patient is not nervous/anxious.      Past Medical History  Diagnosis Date  . Hepatitis C   . Hyperlipidemia   . Asthma   . Herpes simplex type 2 infection   . Recurrent mouth ulceration   . Drug addiction   . Hypertension    Past Surgical History  Procedure Laterality Date  . Abdominal hysterectomy    . Cholecystectomy     Social History:  reports that she quit smoking about 6 years ago. Her smoking use included Cigarettes. She has a 15 pack-year smoking history. She has never used smokeless tobacco. She reports that she does not drink alcohol or use illicit drugs.  No Known Allergies  Family History  Problem Relation Age of Onset  .  Emphysema Father   . Heart disease Father   . Clotting disorder Father      Prior to Admission medications   Medication Sig Start Date End Date Taking? Authorizing Provider  amLODipine-olmesartan (AZOR) 5-20 MG per tablet Take 1 tablet by mouth every morning.   Yes Historical  Provider, MD  dexlansoprazole (DEXILANT) 60 MG capsule Take 60 mg by mouth every morning.  02/18/12  Yes [name] S Parrett, NP  famotidine (PEPCID AC MAXIMUM STRENGTH) 20 MG tablet Take 20 mg by mouth at bedtime.   Yes Historical Provider, MD  mometasone-formoterol (DULERA) 200-5 MCG/ACT AERO Inhale 2 puffs into the lungs every 12 (twelve) hours. 07/29/12  Yes [name], MD  simvastatin (ZOCOR) 10 MG tablet Take 10 mg by mouth at bedtime. 08/25/11 08/24/13 Yes [name] S Parrett, NP  valACYclovir (VALTREX) 1000 MG tablet Take 1,000 mg by mouth 2 (two) times daily.    Yes Historical Provider, MD   Physical Exam: Filed Vitals:   02/07/13 1510 02/07/13 1544 02/07/13 1600 02/07/13 1704  BP: 136/88 110/80 108/72 105/71  Pulse: 80 72 78 65  Temp: 98.9 F (37.2 C)     TempSrc: Oral     Resp: 20 22 22 23   SpO2: 97% 99%  98%    Physical Exam  Constitutional: Appears well-developed and well-nourished. No distress.  HENT: Normocephalic. External right and left ear normal. Oropharynx is clear and moist.  Eyes: Conjunctivae and EOM are normal. PERRLA, no scleral icterus.  Neck: Normal ROM. Neck supple. No JVD. No tracheal deviation. No thyromegaly.  CVS: RRR, S1/S2 +, no murmurs, no gallops, no carotid bruit.  Pulmonary: Effort and breath sounds normal, no stridor, rhonchi, wheezes, rales.  Abdominal: Soft. BS +,  no distension, tenderness, rebound or guarding.  Musculoskeletal: Normal range of motion. No edema and no tenderness.  Lymphadenopathy: No lymphadenopathy noted, cervical, inguinal. Neuro: Alert. Normal reflexes, muscle tone coordination. No cranial nerve deficit. Skin: Skin is warm and dry. No rash noted. Not diaphoretic. No erythema. No pallor.  Psychiatric: Normal mood and affect. Behavior, judgment, thought content normal.   Labs on Admission:  Basic Metabolic Panel:  Recent Labs Lab 02/07/13 1551  NA 137  K 4.3  CL 101  CO2 27  GLUCOSE 121*  BUN 13  CREATININE 0.81   CALCIUM 9.5   Liver Function Tests:  Recent Labs Lab 02/07/13 1551  AST 46*  ALT 37*  ALKPHOS 76  BILITOT 0.3  PROT 8.0  ALBUMIN 3.7    Recent Labs Lab 02/07/13 1551  LIPASE 31   No results found for this basename: AMMONIA,  in the last 168 hours CBC:  Recent Labs Lab 02/07/13 1551  WBC 6.7  HGB 12.3  HCT 37.7  MCV 95.2  PLT 306   Cardiac Enzymes:  Recent Labs Lab 02/07/13 1551  TROPONINI <0.30   BNP: No components found with this basename: POCBNP,  CBG: No results found for this basename: GLUCAP,  in the last 168 hours  Radiological Exams on Admission: Dg Chest 2 View 02/07/2013   *  IMPRESSION: No active disease.  No significant change.   Original Report Authenticated By: Natasha Mead, M.D.    Code Status: Full Family Communication: Pt at bedside Disposition Plan: Admit for further evaluation  Manson Passey, MD  [facility] Pager [phone]  If 7PM-7AM, please contact night-coverage [email or web address] Password [email or web address] 02/07/2013, 6:50 PM

## 2013-02-07 NOTE — ED Provider Notes (Signed)
CSN: [id number]     Arrival date & time 02/07/13  1501 History     First MD Initiated Contact with Patient 02/07/13 1530     Chief Complaint  Patient presents with  . Chest Pain   (Consider location/radiation/quality/duration/timing/severity/associated sxs/prior Treatment) Patient is a 47 y.o. female presenting with chest pain.  Chest Pain  Pt reports she has had exertional SOB off and on for the last 2 weeks. She states she is worse with walking around and up stairs but no orthopnea. She had not had any chest pain until this AM when she reports mid-sternal 'squeezing' while walking around. She denies radiation into arm or jaw. No diaphoresis. She also reports occasional periumbilcal pain and nausea off and on for the last 2 weeks. Not correlated with her SOB or worse with exertion or eating. She denies EtOH use. She has had prior cholecystectomy. She has also had pSBO in the past but denies vomiting and has had normal stools. No dysuria or hematuria.  Past Medical History  Diagnosis Date  . Hepatitis C   . Hyperlipidemia   . Asthma   . Herpes simplex type 2 infection   . Recurrent mouth ulceration   . Drug addiction   . Hypertension    Past Surgical History  Procedure Laterality Date  . Abdominal hysterectomy    . Cholecystectomy     Family History  Problem Relation Age of Onset  . Emphysema Father   . Heart disease Father   . Clotting disorder Father    History  Substance Use Topics  . Smoking status: Former Smoker -- 0.50 packs/day for 30 years    Types: Cigarettes    Quit date: 11/05/2006  . Smokeless tobacco: Never Used  . Alcohol Use: No   OB History   Grav Para Term Preterm Abortions TAB SAB Ect Mult Living                 Review of Systems  Cardiovascular: Positive for chest pain.   All other systems reviewed and are negative except as noted in HPI.   Allergies  Review of patient's allergies indicates no known allergies.  Home Medications   Current  Outpatient Rx  Name  Route  Sig  Dispense  Refill  . amLODipine-olmesartan (AZOR) 5-20 MG per tablet   Oral   Take 1 tablet by mouth every morning.         Marland Kitchen dexlansoprazole (DEXILANT) 60 MG capsule   Oral   Take 60 mg by mouth every morning.          . famotidine (PEPCID AC MAXIMUM STRENGTH) 20 MG tablet   Oral   Take 20 mg by mouth at bedtime.         . mometasone-formoterol (DULERA) 200-5 MCG/ACT AERO   Inhalation   Inhale 2 puffs into the lungs every 12 (twelve) hours.   13 g   11   . simvastatin (ZOCOR) 10 MG tablet   Oral   Take 10 mg by mouth at bedtime.         . valACYclovir (VALTREX) 1000 MG tablet   Oral   Take 1,000 mg by mouth 2 (two) times daily.           BP 110/80  Pulse 72  Temp(Src) 98.9 F (37.2 C) (Oral)  Resp 22  SpO2 99% Physical Exam  Nursing note and vitals reviewed. Constitutional: She is oriented to person, place, and time. She appears well-developed and well-nourished.  HENT:  Head: Normocephalic and atraumatic.  Eyes: EOM are normal. Pupils are equal, round, and reactive to light.  Neck: Normal range of motion. Neck supple.  Cardiovascular: Normal rate, normal heart sounds and intact distal pulses.   Pulmonary/Chest: Effort normal and breath sounds normal. No respiratory distress. She has no wheezes. She has no rales.  Abdominal: Bowel sounds are normal. She exhibits no distension. There is no tenderness. There is no rebound and no guarding.  Musculoskeletal: Normal range of motion. She exhibits no edema and no tenderness.  Neurological: She is alert and oriented to person, place, and time. She has normal strength. No cranial nerve deficit or sensory deficit.  Skin: Skin is warm and dry. No rash noted.  Psychiatric: She has a normal mood and affect.    ED Course   Procedures (including critical care time)  Labs Reviewed  CBC  PRO B NATRIURETIC PEPTIDE  TROPONIN I  COMPREHENSIVE METABOLIC PANEL  LIPASE, BLOOD  POCT  I-STAT TROPONIN I   Dg Chest 2 View  02/07/2013   *RADIOLOGY REPORT*  Clinical Data: Chest pain, shortness of breath, cough  CHEST - 2 VIEW  Comparison: 12/12/2012  Findings: Cardiomediastinal silhouette is stable.  No acute infiltrate or pleural effusion.  No pulmonary edema.  Bony thorax is unremarkable.  IMPRESSION: No active disease.  No significant change.   Original Report Authenticated By: Natasha Mead, M.D.   1. Chest pain   2. COPD (chronic obstructive pulmonary disease)   3. Dyslipidemia   4. Shortness of breath   5. COPD with Asthmatic bronchitis   6. HTN (hypertension)     MDM   Date: 02/07/2013  Rate: 75  Rhythm: normal sinus rhythm  QRS Axis: normal  Intervals: normal  ST/T Wave abnormalities: nonspecific T wave changes  Conduction Disutrbances:none  Narrative Interpretation:   Old EKG Reviewed: none available  Pt with exertional SOB now CP. Labs and imaging unremarkable. Admit for CP, rule out.    Charles B. Bernette Mayers, MD 02/08/13 [phone]

## 2013-02-07 NOTE — Progress Notes (Signed)
Attempted to call for report, no response when put on hold for awhile.

## 2013-02-07 NOTE — Progress Notes (Signed)
Patient activated My Chart. Briscoe Burns BSN, RN-BC Admissions RN  02/07/2013 6:30 PM

## 2013-02-07 NOTE — ED Notes (Signed)
Pt presenting to ed with c/o chest pressure with squeezing and burning sensation x 2 weeks with worsening symptoms today pt states intermittent nausea no vomiting

## 2013-02-08 ENCOUNTER — Encounter (HOSPITAL_COMMUNITY): Payer: Self-pay

## 2013-02-08 ENCOUNTER — Observation Stay (HOSPITAL_COMMUNITY): Payer: Medicare Other

## 2013-02-08 DIAGNOSIS — R0602 Shortness of breath: Principal | ICD-10-CM

## 2013-02-08 DIAGNOSIS — R079 Chest pain, unspecified: Secondary | ICD-10-CM

## 2013-02-08 DIAGNOSIS — I10 Essential (primary) hypertension: Secondary | ICD-10-CM

## 2013-02-08 DIAGNOSIS — J449 Chronic obstructive pulmonary disease, unspecified: Secondary | ICD-10-CM

## 2013-02-08 DIAGNOSIS — R072 Precordial pain: Secondary | ICD-10-CM

## 2013-02-08 DIAGNOSIS — J4489 Other specified chronic obstructive pulmonary disease: Secondary | ICD-10-CM

## 2013-02-08 LAB — COMPREHENSIVE METABOLIC PANEL
BUN: 15 mg/dL (ref 6–23)
Calcium: 8.9 mg/dL (ref 8.4–10.5)
GFR calc Af Amer: >90 mL/min (ref >90)
Glucose, Bld: 108 mg/dL — ABNORMAL HIGH (ref 70–99)
Total Protein: 7 g/dL (ref 6.0–8.3)

## 2013-02-08 LAB — TROPONIN I
Troponin I: <0.3 ng/mL (ref <0.30)
Troponin I: <0.3 ng/mL (ref <0.30)

## 2013-02-08 LAB — CBC
HCT: 34.6 % — ABNORMAL LOW (ref 36.0–46.0)
Hemoglobin: 11.3 g/dL — ABNORMAL LOW (ref 12.0–15.0)
RBC: 3.62 MIL/uL — ABNORMAL LOW (ref 3.87–5.11)
WBC: 5.3 10*3/uL (ref 4.0–10.5)

## 2013-02-08 MED ORDER — UNABLE TO FIND: Status: DC

## 2013-02-08 MED ORDER — SODIUM CHLORIDE 0.9 % IV BOLUS (SEPSIS)
500.0000 mL | Freq: Once | INTRAVENOUS | Status: AC
  Administered 2013-02-08: 500 mL via INTRAVENOUS

## 2013-02-08 MED ORDER — IOHEXOL 350 MG/ML SOLN
80.0000 mL | Freq: Once | INTRAVENOUS | Status: AC | PRN
  Administered 2013-02-08: 80 mL via INTRAVENOUS

## 2013-02-08 NOTE — Care Management Note (Signed)
    Page 1 of 1   02/08/2013     12:09:26 PM   CARE MANAGEMENT NOTE 02/08/2013  Patient:  Brittany Sparks, Brittany Sparks   Account Number:  [id number]  Date Initiated:  02/08/2013  Documentation initiated by:  Lanier Clam  Subjective/Objective Assessment:   ADMITTED W/[id number].ZO:[id number].     Action/Plan:   FROM HOME.HAS PCP,PHARMACY.   Anticipated DC Date:  02/08/2013   Anticipated DC Plan:  HOME/SELF CARE      DC Planning Services  CM consult      Choice offered to / List presented to:             Status of service:  Completed, signed off Medicare Important Message given?   (If response is "NO", the following Medicare IM given date fields will be blank) Date Medicare IM given:   Date Additional Medicare IM given:    Discharge Disposition:  HOME/SELF CARE  Per UR Regulation:  Reviewed for med. necessity/level of care/duration of stay  If discussed at Long Length of Stay Meetings, dates discussed:    Comments:  02/08/13 Parnika Tweten RN,BSN NCM [phone] NO D/C NEEDS OR ORDERS.

## 2013-02-08 NOTE — Progress Notes (Signed)
*  PRELIMINARY RESULTS* Echocardiogram 2D Echocardiogram has been performed.  Brittany Sparks 02/08/2013, 11:33 AM 

## 2013-02-08 NOTE — Discharge Summary (Signed)
Physician Discharge Summary  Brittany Sparks ZOX:[id number] DOB: Oct 11, 1965 DOA: 02/07/2013  PCP: [name], FNP  Admit date: 02/07/2013 Discharge date: 02/08/2013  Time spent: 40 minutes  Recommendations for Outpatient Follow-up:  1. Follow up with primary MD.  2. Advised to hold antihypertensives today, and restart on 02/09/13.   Discharge Diagnoses:  Principal Problem:   Shortness of breath Active Problems:   COPD with Asthmatic bronchitis   HTN (hypertension)   Chest pain   Discharge Condition: Satisfactory.   Diet recommendation: Heart-Healthy.   Filed Weights   02/07/13 1926 02/08/13 0504  Weight: 69.945 kg (154 lb 3.2 oz) 73.165 kg (161 lb 4.8 oz)    History of present illness:  47 year old female with history of Hepatitis C, dyslipidemia, bronchial asthma/COPD, H. Simplex type 2, COPD, hypertension, and previous small bowel obstruction, who presented to [facility] ED for evaluation of ongoing shortness of breath on exertion for the previous 10 days. Patient reports that shortness of breath improves with rest. She only started experiencing chest pain on 02/07/13, prior to the admission, which started with ambulation, was retrosternal, non-radiating, 6-7/10 in intensity and relieved with Nitroglycerin. No associated palpitations, fever, chills, abdominal pain, nausea or vomiting. No reports of blood in stool or urine. In ED, vitals were stable with BP of 105/71, HR 65 and O2 saturation of 97% on 2 L nasal canula. T-max was 98.9 F. The 12 lead EKG showed sinus arrhythmia. CBC and BMP were unremarkable and so was the CXR. The first set of cardiac enzymes was within normal limits. Admitted for further evaluation and management.   Hospital Course:  1. Dyspnea/Chest pain: Patient presented with progressive SOB over the previous 10 days, without productive cough or wheeze, and developed retrosternal chest pain, just prior to admission. 12-Lead EKG was devoid of acute ischemic changes, CXR showed  no active disease, ProBNP was only 10.2, and cardiac enzymes remained unelevated and CTA was negative for PE or other pulmonary lesion. No arrhythmias were documented on telemetric monitoring, and patient had no recurrence of chest pain. Clinically, she did not appear to have exacerbation of obstructive airways disease. O2 saturation as of AM of 02/08/13, was 96%-98% on RA, and patient was very keen to go home.  2. COPD/Bronchial asthma: See above discussion. Patient had no clinical exacerbation at this time. Managed with prn Duoneb, as well as Dulera inhaler.  3. HTN (hypertension): BP was normal on admission, but this AM, was found to be low at 88/54 in AM of 02/08/13. Patient was asymptomatic. Pre-admission antihypertensives were placed on hold, and she was bolused 500 ml NS, with normalization of BP to 110/60. Patient has been advised to hold antihypertensives today, and restart on 02/09/13.  4. Hepatitis C: Clinically stable. LFTs are normal.      Procedures:  See Below.   Consultations:  N/A.   Discharge Exam: Filed Vitals:   02/08/13 1255  BP: 110/60  Pulse: 71  Temp: 97.9 F (36.6 C)  Resp: 16    General: Comfortable, alert, communicative, fully oriented, not short of breath at rest.  HEENT: No clinical pallor, no jaundice, no conjunctival injection or discharge. Hydration appears fair.  NECK: Supple, JVP not seen, no carotid bruits, no palpable lymphadenopathy, no palpable goiter.  CHEST: Clinically clear to auscultation, no wheezes, no crackles.  HEART: Sounds 1 and 2 heard, normal, regular, no murmurs.  ABDOMEN: Moderately obese, soft, non-tender, no palpable organomegaly, no palpable masses, normal bowel sounds.  GENITALIA: Not examined.  LOWER  EXTREMITIES: No pitting edema, palpable peripheral pulses.  MUSCULOSKELETAL SYSTEM: Unremarkable.  CENTRAL NERVOUS SYSTEM: No focal neurologic deficit on gross examination.  Discharge Instructions      Discharge Orders   Future  Orders Complete By Expires     Diet - low sodium heart healthy  As directed     Increase activity slowly  As directed         Medication List         AZOR 5-20 MG per tablet  Generic drug:  amLODipine-olmesartan  Take 1 tablet by mouth every morning.     dexlansoprazole 60 MG capsule  Commonly known as:  DEXILANT  Take 60 mg by mouth every morning.     mometasone-formoterol 200-5 MCG/ACT Aero  Commonly known as:  DULERA  Inhale 2 puffs into the lungs every 12 (twelve) hours.     PEPCID AC MAXIMUM STRENGTH 20 MG tablet  Generic drug:  famotidine  Take 20 mg by mouth at bedtime.     simvastatin 10 MG tablet  Commonly known as:  ZOCOR  Take 10 mg by mouth at bedtime.     UNABLE TO FIND  Blood Pressure Monitor for home use.     valACYclovir 1000 MG tablet  Commonly known as:  VALTREX  Take 1,000 mg by mouth 2 (two) times daily.       No Known Allergies Follow-up Information   Follow up with [name], FNP.   Contact information:   [address] [place] [address] [phone]        The results of significant diagnostics from this hospitalization (including imaging, microbiology, ancillary and laboratory) are listed below for reference.    Significant Diagnostic Studies: Dg Chest 2 View  02/07/2013   *RADIOLOGY REPORT*  Clinical Data: Chest pain, shortness of breath, cough  CHEST - 2 VIEW  Comparison: 12/12/2012  Findings: Cardiomediastinal silhouette is stable.  No acute infiltrate or pleural effusion.  No pulmonary edema.  Bony thorax is unremarkable.  IMPRESSION: No active disease.  No significant change.   Original Report Authenticated By: Natasha Mead, M.D.   Ct Angio Chest Pe W/cm &/or Wo Cm  02/08/2013   **ADDENDUM** CREATED: 02/08/2013 09:43:03  Suggestion of focal calcification of the left main coronary artery (series 6 image 48).  Right lower lobe tiny varix suspected (series 9 image 75). Calcified granuloma as a cause of this appears not entirely  excluded.  **END ADDENDUM** SIGNED BY: Almedia Balls. Constance Goltz, M.D.  02/08/2013   *RADIOLOGY REPORT*  Clinical Data: Left upper chest pain. Shortness of breath.  Cough. Question pulmonary embolus.  CT ANGIOGRAPHY CHEST  Technique:  Multidetector CT imaging of the chest using the standard protocol during bolus administration of intravenous contrast. Multiplanar reconstructed images including MIPs were obtained and reviewed to evaluate the vascular anatomy.  Contrast: 80mL OMNIPAQUE IOHEXOL 350 MG/ML SOLN  Comparison: 02/07/2013 chest x-ray.  No comparison chest CT.  Findings: No evidence of pulmonary embolus or aortic dissection.  Within the peripheral aspect of the right upper lobe there is a 3 mm ground-glass opacity (series 6 and three image 19 and series 9 image 44).  No worrisome lung mass is noted.  Bullae medial aspect left lung.  No mediastinal, hilar or axillary adenopathy.  Visualized upper abdominal structures unremarkable.  No bony destructive lesion.  IMPRESSION: No evidence of pulmonary embolus.  No worrisome lung mass detected.   Original Report Authenticated By: Lacy Duverney, M.D.    Microbiology: No results found  for this or any previous visit (from the past 240 hour(s)).   Labs: Basic Metabolic Panel:  Recent Labs Lab 02/07/13 1551 02/07/13 2215 02/08/13 0330  NA 137 140 139  K 4.3 3.6 3.5  CL 101 104 104  CO2 27 28 27   GLUCOSE 121* 142* 108*  BUN 13 15 15   CREATININE 0.81 0.73 0.69  CALCIUM 9.5 9.1 8.9  MG  --  2.1  --   PHOS  --  4.6  --    Liver Function Tests:  Recent Labs Lab 02/07/13 1551 02/07/13 2215 02/08/13 0330  AST 46* 31 29  ALT 37* 31 31  ALKPHOS 76 74 70  BILITOT 0.3 0.2* 0.2*  PROT 8.0 7.3 7.0  ALBUMIN 3.7 3.5 3.3*    Recent Labs Lab 02/07/13 1551  LIPASE 31   No results found for this basename: AMMONIA,  in the last 168 hours CBC:  Recent Labs Lab 02/07/13 1551 02/07/13 2215 02/08/13 0330  WBC 6.7 6.1 5.3  NEUTROABS  --  3.0  --   HGB  12.3 11.8* 11.3*  HCT 37.7 36.2 34.6*  MCV 95.2 94.8 95.6  PLT 306 306 317   Cardiac Enzymes:  Recent Labs Lab 02/07/13 1551 02/07/13 2215 02/08/13 0330 02/08/13 1125  TROPONINI <0.30 <0.30 <0.30 <0.30   BNP: BNP (last 3 results)  Recent Labs  02/07/13 1513  PROBNP 10.2   CBG:  Recent Labs Lab 02/08/13 0750  GLUCAP 86       Signed:  Marien Manship,CHRISTOPHER  [facility] 02/08/2013, 3:29 PM

## 2013-02-08 NOTE — Progress Notes (Signed)
[facility] PROGRESS NOTE  Brittany Sparks AOZ:[id number] DOB: 1966-03-19 DOA: 02/07/2013 PCP: [name], FNP  Assessment/Plan: Principal Problem:   Shortness of breath Active Problems:   COPD with Asthmatic bronchitis   HTN (hypertension)   Chest pain    1. Dyspnea/Chest pain: Patient presented with progressive SOB over the previous 10 days, without productive cough or wheeze, and developed retrosternal chest pain, just prior to admission. 12-Lead EKG was devoid of acute ischemic changes, CXR showed no active disease, ProBNP was only 10.2, and cardiac enzymes remained unelevated. No arrhythmias were documented on telemetric monitoring, and patient had no recurrence of chest pain. Clinically, she does not appear to have exacerbation of obstructive airways disease. O2 saturation this AM is 96%-98% on RA. Patient does work standing for long periods a atime, so will do CTA to evaluate for possible PE.  2. COPD/Bronchial asthma: See above discussion.No clinical exacerbation at this time. Managing with Albuterol/Atrovent as needed, as well as Dulera inhaler.   3. HTN (hypertension):  BP was normal on admission, but this AM , has dropped to 88/54. Patient is asymptomatic. Have placed pre-admission antihypertensives on hold. Bolused 500 ml NS, and continued maintenance ivi NS. Will observe closely.  4. Hepatitis C: Clinically stable. LFTs are normal.    Code Status: Full Code.  Family Communication:  Disposition Plan: To be determined.   Brief narrative: 47 year old female with history of Hepatitis C, dyslipidemia, bronchial asthma/COPD, H. Simplex type 2, COPD, hypertension, and previous small bowel obstruction, who presented to [facility] ED for evaluation of ongoing shortness of breath on exertion for the previous 10 days. Patient reports that shortness of breath improves with rest. She only started experiencing chest pain on 02/07/13, prior to the admission, which started with ambulation, was  retrosternal, non-radiating, 6-7/10 in intensity and relieved with Nitroglycerin. No associated palpitations, fever, chills, abdominal pain, nausea or vomiting. No reports of blood in stool or urine. In ED, vitals were stable with BP of 105/71, HR 65 and O2 saturation of 97% on 2 L nasal canula. T-max was 98.9 F. The 12 lead EKG showed sinus arrhythmia. CBC and BMP were unremarkable and so was the CXR. The first set of cardiac enzymes was within normal limits. Admitted for further evaluation and management   Consultants:  N/A.   Procedures:  CXR.   Antibiotics:  Valtrex 02/07/13>>>  HPI/Subjective: Asymptomatic this AM.   Objective: Vital signs in last 24 hours: Temp:  [97.1 F (36.2 C)-98.9 F (37.2 C)] 97.1 F (36.2 C) (08/05 0504) Pulse Rate:  [59-80] 59 (08/05 0504) Resp:  [16-23] 16 (08/05 0504) BP: (88-136)/(54-88) 88/54 mmHg (08/05 0504) SpO2:  [96 %-100 %] 96 % (08/05 0504) FiO2 (%):  [2 %] 2 % (08/04 1543) Weight:  [69.945 kg (154 lb 3.2 oz)-73.165 kg (161 lb 4.8 oz)] 73.165 kg (161 lb 4.8 oz) (08/05 0504) Weight change:  Last BM Date: 02/07/13  Intake/Output from previous day:       Physical Exam: General: Comfortable, alert, communicative, fully oriented, not short of breath at rest.  HEENT:  No clinical pallor, no jaundice, no conjunctival injection or discharge. Hydration appears fair.  NECK:  Supple, JVP not seen, no carotid bruits, no palpable lymphadenopathy, no palpable goiter. CHEST:  Clinically clear to auscultation, no wheezes, no crackles. HEART:  Sounds 1 and 2 heard, normal, regular, no murmurs. ABDOMEN:  Moderately obese, soft, non-tender, no palpable organomegaly, no palpable masses, normal bowel sounds. GENITALIA:  Not examined. LOWER EXTREMITIES:  No pitting edema, palpable peripheral pulses. MUSCULOSKELETAL SYSTEM:  Unremarkable. CENTRAL NERVOUS SYSTEM:  No focal neurologic deficit on gross examination.  Lab Results:  Recent Labs   02/07/13 2215 02/08/13 0330  WBC 6.1 5.3  HGB 11.8* 11.3*  HCT 36.2 34.6*  PLT 306 317    Recent Labs  02/07/13 2215 02/08/13 0330  NA 140 139  K 3.6 3.5  CL 104 104  CO2 28 27  GLUCOSE 142* 108*  BUN 15 15  CREATININE 0.73 0.69  CALCIUM 9.1 8.9   No results found for this or any previous visit (from the past 240 hour(s)).   Studies/Results: Dg Chest 2 View  02/07/2013   *RADIOLOGY REPORT*  Clinical Data: Chest pain, shortness of breath, cough  CHEST - 2 VIEW  Comparison: 12/12/2012  Findings: Cardiomediastinal silhouette is stable.  No acute infiltrate or pleural effusion.  No pulmonary edema.  Bony thorax is unremarkable.  IMPRESSION: No active disease.  No significant change.   Original Report Authenticated By: Natasha Mead, M.D.    Medications: Scheduled Meds: . amLODipine  5 mg Oral Daily  . famotidine  20 mg Oral QHS  . irbesartan  150 mg Oral Daily  . mometasone-formoterol  2 puff Inhalation Q12H  . pantoprazole  40 mg Oral Daily  . simvastatin  10 mg Oral QHS  . sodium chloride  3 mL Intravenous Q12H  . valACYclovir  1,000 mg Oral BID   Continuous Infusions: . sodium chloride 75 mL/hr at 02/07/13 2343   PRN Meds:.acetaminophen, acetaminophen, albuterol, HYDROcodone-acetaminophen, ipratropium, morphine injection, nitroGLYCERIN, ondansetron (ZOFRAN) IV, ondansetron    LOS: 1 day   Brittany Sparks,CHRISTOPHER  [facility] Pager [phone]. If 8PM-8AM, please contact night-coverage at [email or web address], password [facility] 02/08/2013, 7:38 AM  LOS: 1 day

## 2013-04-26 ENCOUNTER — Other Ambulatory Visit: Payer: Self-pay | Admitting: Internal Medicine

## 2013-04-26 DIAGNOSIS — C220 Liver cell carcinoma: Secondary | ICD-10-CM

## 2013-05-11 ENCOUNTER — Ambulatory Visit
Admission: RE | Admit: 2013-05-11 | Discharge: 2013-05-11 | Disposition: A | Discharge status: Home / Self Care | Payer: Medicare Other | Source: Ambulatory Visit | Attending: Internal Medicine | Admitting: Internal Medicine

## 2013-05-11 ENCOUNTER — Other Ambulatory Visit: Payer: Medicare Other

## 2013-05-11 DIAGNOSIS — C220 Liver cell carcinoma: Secondary | ICD-10-CM

## 2013-05-12 ENCOUNTER — Other Ambulatory Visit: Payer: Self-pay

## 2013-07-06 ENCOUNTER — Encounter (HOSPITAL_COMMUNITY): Payer: Self-pay | Admitting: Emergency Medicine

## 2013-07-06 ENCOUNTER — Emergency Department (HOSPITAL_COMMUNITY)
Admission: EM | Admit: 2013-07-06 | Discharge: 2013-07-06 | Disposition: A | Discharge status: Home / Self Care | Payer: Medicare Other | Attending: Emergency Medicine | Admitting: Emergency Medicine

## 2013-07-06 DIAGNOSIS — J069 Acute upper respiratory infection, unspecified: Secondary | ICD-10-CM | POA: Insufficient documentation

## 2013-07-06 DIAGNOSIS — B9789 Other viral agents as the cause of diseases classified elsewhere: Secondary | ICD-10-CM

## 2013-07-06 DIAGNOSIS — Z8619 Personal history of other infectious and parasitic diseases: Secondary | ICD-10-CM | POA: Insufficient documentation

## 2013-07-06 DIAGNOSIS — R51 Headache: Secondary | ICD-10-CM | POA: Insufficient documentation

## 2013-07-06 DIAGNOSIS — K1370 Unspecified lesions of oral mucosa: Secondary | ICD-10-CM | POA: Insufficient documentation

## 2013-07-06 DIAGNOSIS — J449 Chronic obstructive pulmonary disease, unspecified: Secondary | ICD-10-CM

## 2013-07-06 DIAGNOSIS — E785 Hyperlipidemia, unspecified: Secondary | ICD-10-CM | POA: Insufficient documentation

## 2013-07-06 DIAGNOSIS — R110 Nausea: Secondary | ICD-10-CM | POA: Insufficient documentation

## 2013-07-06 DIAGNOSIS — Z79899 Other long term (current) drug therapy: Secondary | ICD-10-CM | POA: Insufficient documentation

## 2013-07-06 DIAGNOSIS — J45901 Unspecified asthma with (acute) exacerbation: Secondary | ICD-10-CM | POA: Insufficient documentation

## 2013-07-06 DIAGNOSIS — Z87891 Personal history of nicotine dependence: Secondary | ICD-10-CM | POA: Insufficient documentation

## 2013-07-06 DIAGNOSIS — I10 Essential (primary) hypertension: Secondary | ICD-10-CM | POA: Insufficient documentation

## 2013-07-06 MED ORDER — HYDROCOD POLST-CHLORPHEN POLST 10-8 MG/5ML PO LQCR: 5.0000 mL | Freq: Two times a day (BID) | ORAL | Status: DC | PRN

## 2013-07-06 MED ORDER — OXYMETAZOLINE HCL 0.05 % NA SOLN: 1.0000 | Freq: Two times a day (BID) | NASAL | Status: DC

## 2013-07-06 MED ORDER — ALBUTEROL SULFATE HFA 108 (90 BASE) MCG/ACT IN AERS: 1.0000 | INHALATION_SPRAY | Freq: Four times a day (QID) | RESPIRATORY_TRACT | Status: DC | PRN

## 2013-07-06 MED ORDER — HYDROCODONE-ACETAMINOPHEN 5-325 MG PO TABS: 1.0000 | ORAL_TABLET | ORAL | Status: DC | PRN

## 2013-07-06 NOTE — ED Provider Notes (Signed)
Medical screening examination/treatment/procedure(s) were performed by non-physician practitioner and as supervising physician I was immediately available for consultation/collaboration.  EKG Interpretation   None         Junius Argyle, MD 07/06/13 [phone]

## 2013-07-06 NOTE — ED Notes (Addendum)
Pt c/o sinus pain/congestion and nausea x 1 day and oral lesions x 3 days.  Pain score 10/10.  Pt sts "I take medication for the sores in my mouth, but it isn't working.  I was around my grandson this weekend and he's been sick."  Hx of herpes.

## 2013-07-06 NOTE — ED Notes (Signed)
Voiced understanding of instructions given 

## 2013-07-06 NOTE — Discharge Instructions (Signed)
Read the information below.  Use the prescribed medication as directed.  Please discuss all new medications with your pharmacist.  You may return to the Emergency Department at any time for worsening condition or any new symptoms that concern you.  If you develop high fevers that do not resolve with tylenol or ibuprofen, you have difficulty swallowing or breathing, or you are unable to tolerate fluids by mouth, return to the ER for a recheck.   ° ° °Antibiotic Nonuse ° Your caregiver felt that the infection or problem was not one that would be helped with an antibiotic. °Infections may be caused by viruses or bacteria. Only a caregiver can tell which one of these is the likely cause of an illness. A cold is the most common cause of infection in both adults and children. A cold is a virus. Antibiotic treatment will have no effect on a viral infection. Viruses can lead to many lost days of work caring for sick children and many missed days of school. Children may catch as many as 10 "colds" or "flus" per year during which they can be tearful, cranky, and uncomfortable. The goal of treating a virus is aimed at keeping the ill person comfortable. °Antibiotics are medications used to help the body fight bacterial infections. There are relatively few types of bacteria that cause infections but there are hundreds of viruses. While both viruses and bacteria cause infection they are very different types of germs. A viral infection will typically go away by itself within 7 to 10 days. Bacterial infections may spread or get worse without antibiotic treatment. °Examples of bacterial infections are: °· Sore throats (like strep throat or tonsillitis). °· Infection in the lung (pneumonia). °· Ear and skin infections. °Examples of viral infections are: °· Colds or flus. °· Most coughs and bronchitis. °· Sore throats not caused by Strep. °· Runny noses. °It is often best not to take an antibiotic when a viral infection is the cause  of the problem. Antibiotics can kill off the helpful bacteria that we have inside our body and allow harmful bacteria to start growing. Antibiotics can cause side effects such as allergies, nausea, and diarrhea without helping to improve the symptoms of the viral infection. Additionally, repeated uses of antibiotics can cause bacteria inside of our body to become resistant. That resistance can be passed onto harmful bacterial. The next time you have an infection it may be harder to treat if antibiotics are used when they are not needed. Not treating with antibiotics allows our own immune system to develop and take care of infections more efficiently. Also, antibiotics will work better for us when they are prescribed for bacterial infections. °Treatments for a child that is ill may include: °· Give extra fluids throughout the day to stay hydrated. °· Get plenty of rest. °· Only give your child over-the-counter or prescription medicines for pain, discomfort, or fever as directed by your caregiver. °· The use of a cool mist humidifier may help stuffy noses. °· Cold medications if suggested by your caregiver. °Your caregiver may decide to start you on an antibiotic if: °· The problem you were seen for today continues for a longer length of time than expected. °· You develop a secondary bacterial infection. °SEEK MEDICAL CARE IF: °· Fever lasts longer than 5 days. °· Symptoms continue to get worse after 5 to 7 days or become severe. °· Difficulty in breathing develops. °· Signs of dehydration develop (poor drinking, rare urinating, dark colored urine). °·   Changes in behavior or worsening tiredness (listlessness or lethargy). °Document Released: 09/01/2001 Document Revised: 09/15/2011 Document Reviewed: 02/28/2009 °ExitCare® Patient Information ©2014 [employer]. ° °Viral Infections °A viral infection can be caused by different types of viruses. Most viral infections are not serious and resolve on their own. However,  some infections may cause severe symptoms and may lead to further complications. °SYMPTOMS °Viruses can frequently cause: °· Minor sore throat. °· Aches and pains. °· Headaches. °· Runny nose. °· Different types of rashes. °· Watery eyes. °· Tiredness. °· Cough. °· Loss of appetite. °· Gastrointestinal infections, resulting in nausea, vomiting, and diarrhea. °These symptoms do not respond to antibiotics because the infection is not caused by bacteria. However, you might catch a bacterial infection following the viral infection. This is sometimes called a "superinfection." Symptoms of such a bacterial infection may include: °· Worsening sore throat with pus and difficulty swallowing. °· Swollen neck glands. °· Chills and a high or persistent fever. °· Severe headache. °· Tenderness over the sinuses. °· Persistent overall ill feeling (malaise), muscle aches, and tiredness (fatigue). °· Persistent cough. °· Yellow, green, or brown mucus production with coughing. °HOME CARE INSTRUCTIONS  °· Only take over-the-counter or prescription medicines for pain, discomfort, diarrhea, or fever as directed by your caregiver. °· Drink enough water and fluids to keep your urine clear or pale yellow. Sports drinks can provide valuable electrolytes, sugars, and hydration. °· Get plenty of rest and maintain proper nutrition. Soups and broths with crackers or rice are fine. °SEEK IMMEDIATE MEDICAL CARE IF:  °· You have severe headaches, shortness of breath, chest pain, neck pain, or an unusual rash. °· You have uncontrolled vomiting, diarrhea, or you are unable to keep down fluids. °· You or your child has an oral temperature above 102° F (38.9° C), not controlled by medicine. °· Your baby is older than 3 months with a rectal temperature of 102° F (38.9° C) or higher. °· Your baby is 3 months old or younger with a rectal temperature of 100.4° F (38° C) or higher. °MAKE SURE YOU:  °· Understand these instructions. °· Will watch your  condition. °· Will get help right away if you are not doing well or get worse. °Document Released: 04/02/2005 Document Revised: 09/15/2011 Document Reviewed: 10/28/2010 °ExitCare® Patient Information ©2014 [employer]. ° °

## 2013-07-06 NOTE — ED Notes (Signed)
States that she needs a note to verify that she was treated today.

## 2013-07-06 NOTE — ED Provider Notes (Signed)
CSN: [id number]     Arrival date & time 07/06/13  1216 History  This chart was scribed for non-physician practitioner, Trixie Dredge, PA-C working with Junius Argyle, MD by Greggory Stallion, ED scribe. This patient was seen in room WTR7/WTR7 and the patient's care was started at 2:10 PM.   Chief Complaint  Patient presents with  . Facial Pain  . Nasal Congestion  . Nausea  . Mouth Lesions   The history is provided by the patient. No language interpreter was used.   HPI Comments: Brittany Sparks is a 47 y.o. female with history of asthma who presents to the Emergency Department complaining of sinus congestion, sinus pressure, productive cough of dark sputum, headache and nausea that started one day ago. Pt states she also has mouth lesions that started 3 days ago. States she had a sore throat yesterday and took prescribed throat lozenges with relief. Pt has also taken tylenol severe cold and used a nettie pot with no relief. She states she has also had SOB and wheezing, hx asthma - Pt states she is out of her inhalers. Denies abdominal pain, emesis, diarrhea.  Pt states her grandson has been sick with similar symptoms.   Past Medical History  Diagnosis Date  . Hepatitis C   . Hyperlipidemia   . Asthma   . Herpes simplex type 2 infection   . Recurrent mouth ulceration   . Drug addiction   . Hypertension    Past Surgical History  Procedure Laterality Date  . Abdominal hysterectomy    . Cholecystectomy     Family History  Problem Relation Age of Onset  . Emphysema Father   . Heart disease Father   . Clotting disorder Father    History  Substance Use Topics  . Smoking status: Former Smoker -- 0.50 packs/day for 30 years    Types: Cigarettes    Quit date: 11/05/2006  . Smokeless tobacco: Never Used  . Alcohol Use: No   OB History   Grav Para Term Preterm Abortions TAB SAB Ect Mult Living                 Review of Systems  HENT: Positive for congestion, mouth sores and sinus  pressure. Negative for sore throat.   Respiratory: Positive for cough, shortness of breath and wheezing.   Gastrointestinal: Positive for nausea. Negative for vomiting, abdominal pain and diarrhea.  Neurological: Positive for headaches.  All other systems reviewed and are negative.    Allergies  Review of patient's allergies indicates no known allergies.  Home Medications   Current Outpatient Rx  Name  Route  Sig  Dispense  Refill  . amLODipine-olmesartan (AZOR) 5-20 MG per tablet   Oral   Take 1 tablet by mouth every morning.         . Aspirin-Acetaminophen-Caffeine (GOODY HEADACHE PO)   Oral   Take 1 packet by mouth daily.         Marland Kitchen aspirin-sod bicarb-citric acid (ALKA-SELTZER) 325 MG TBEF tablet   Oral   Take 325 mg by mouth every 6 (six) hours as needed.         Marland Kitchen dexlansoprazole (DEXILANT) 60 MG capsule   Oral   Take 60 mg by mouth every morning.          . famotidine (PEPCID AC MAXIMUM STRENGTH) 20 MG tablet   Oral   Take 20 mg by mouth at bedtime.         . mometasone-formoterol (DULERA)  200-5 MCG/ACT AERO   Inhalation   Inhale 2 puffs into the lungs every 12 (twelve) hours.   13 g   11   . pseudoephedrine-acetaminophen (TYLENOL SINUS) 30-500 MG TABS   Oral   Take 2 tablets by mouth every 4 (four) hours as needed.         . simvastatin (ZOCOR) 10 MG tablet   Oral   Take 10 mg by mouth at bedtime.         . valACYclovir (VALTREX) 1000 MG tablet   Oral   Take 1,000 mg by mouth 2 (two) times daily.           BP 139/96  Pulse 79  Temp(Src) 98.6 F (37 C) (Oral)  Resp 16  SpO2 96%  Physical Exam  Nursing note and vitals reviewed. Constitutional: She appears well-developed and well-nourished. No distress.  HENT:  Head: Normocephalic and atraumatic.  Nose: Right sinus exhibits maxillary sinus tenderness. Right sinus exhibits no frontal sinus tenderness. Left sinus exhibits maxillary sinus tenderness. Left sinus exhibits no frontal  sinus tenderness.  Mouth/Throat: Oropharynx is clear and moist.  Bilateral edema and erythema to turbinates.   Neck: Neck supple.  Cardiovascular: Normal rate, regular rhythm and normal heart sounds.   Pulmonary/Chest: Effort normal and breath sounds normal. She has no wheezes. She has no rales.  Occasional cough  Lymphadenopathy:    She has no cervical adenopathy.  Neurological: She is alert.  Skin: She is not diaphoretic.    ED Course  Procedures (including critical care time)  DIAGNOSTIC STUDIES: Oxygen Saturation is 96% on RA, normal by my interpretation.    COORDINATION OF CARE: 2:15 PM-Discussed treatment plan which includes refilling inhaler with pt at bedside and pt agreed to plan.   Labs Review Labs Reviewed - No data to display Imaging Review No results found.  EKG Interpretation   None       MDM   1. Viral respiratory illness   2. COPD with Asthmatic bronchitis    Patient with 2 days of nasal congestion, sinus pressure, sore throat, cough. She has a history of asthma and is out of her inhalers. Per pharmacy she has multiple refills available for her [facility].  Patient is afebrile, nontoxic, exam is unremarkable. Very likely that this is a viral illness. Her grandson has been sick with the same. Patient discharged home with albuterol, Tussionex, Afrin nasal spray. Primary care followup.  Discussed findings, treatment, and follow up  with patient.  Pt given return precautions.  Pt verbalizes understanding and agrees with plan.      I personally performed the services described in this documentation, which was scribed in my presence. The recorded information has been reviewed and is accurate.   Trixie Dredge, PA-C 07/06/13 [phone]

## 2013-08-19 ENCOUNTER — Ambulatory Visit: Payer: Medicare Other | Admitting: Internal Medicine

## 2013-08-23 ENCOUNTER — Ambulatory Visit: Payer: Medicare Other | Admitting: Internal Medicine

## 2013-08-29 ENCOUNTER — Encounter: Payer: Self-pay | Admitting: Internal Medicine

## 2013-08-29 ENCOUNTER — Ambulatory Visit (INDEPENDENT_AMBULATORY_CARE_PROVIDER_SITE_OTHER): Payer: Medicare Other | Admitting: Internal Medicine

## 2013-08-29 VITALS — BP 92/60 | HR 66 | Temp 98.7°F | Ht 60.0 in | Wt 154.0 lb

## 2013-08-29 DIAGNOSIS — J449 Chronic obstructive pulmonary disease, unspecified: Secondary | ICD-10-CM

## 2013-08-29 MED ORDER — AMOXICILLIN-POT CLAVULANATE 875-125 MG PO TABS: 1.0000 | ORAL_TABLET | Freq: Two times a day (BID) | ORAL | Status: DC

## 2013-08-29 MED ORDER — PREDNISONE 10 MG PO TABS: ORAL_TABLET | ORAL | Status: DC

## 2013-08-29 MED ORDER — TRAMADOL HCL 50 MG PO TABS
ORAL_TABLET | ORAL | Status: DC
Start: 2013-08-29 — End: 2015-06-11

## 2013-08-29 NOTE — Progress Notes (Signed)
Subjective:    Patient ID: Brittany Sparks, female    DOB: 24-Dec-1965  MRN: [id number]     Brief patient profile:  70 yobf  quit smoking 2008 p dx of asthma by [facility] around 2000 referred 07/07/2011 to  Pulmonary clinic by Vicenta Aly with GOLD II COPD documented  07/29/12     History of Present Illness  07/07/2011 1st pulmonary cc cough x 12 years, daily no treatment for asthma has helped the cough including advair then abupt onset cough and sob much worse   x 3 weeks changed bp meds x 2 weeks ? Was this an ace inhibitor? - cough is dry more day than night but present 24 h per day, minimal sob, controls with saba but takes up to 10 puffs at a time before improves. rec Try aciphex  20mg   Take 30-60 min before first meal of the day and Pepcid 20 mg one bedtime  Symbicort 160 Take 2 puffs first thing in am and then another 2 puffs about 12 hours later.  Work on inhaler technique:  Only use your albuterol(ventolin, blue inhaler) as a rescue medication .  Best cough medication is delsym   08/11/2011 f/u ov/Wert on prevacid 30 mg and pepcid one at bedtime Cough improved but worsened again x 1 wk prior to OV   Shortness of Breath improved. Still using ventolin day but not typically at night.  Inconsistent with meds, unsure of names, does not really understand maint vs prns and stops them when she feels better or rx runs out.  >>prednisone taper , changed to AZOR from cozaar/norvasc ,augmentin rx  08/25/2011 Follow up and med review  Pt returns for follow up and med review. Since last ov feeling much better.  We reviewed all her meds and organized them into a med calendar  W/ pt education  It appears she is taking meds correctly.  No chest pain or edema. Dyspnea is less.  rec Stop Goody powder/meds  Follow med calendar closely and bring to each visit.  Brush/rinse and gargle after inhaler use  10/10/2011 f/u ov/Wert cc overall better but still using ventolin 3-4 x week esp with  activity due to sob, no purulent sputum or over sinus or reflux symptoms. rec Try substituting the dulera 200 Take 2 puffs first thing in am and then another 2 puffs about 12 hours later.  Dexilant 60 Take 30-60 min before first meal of the day while waiting to sort out the omeprazole  Issue   11/07/2011 f/u ov/Wert cc only cough in am attributed to sinus drainage and heavy perfume, better when takes flonase first thing in am, no excess mucus day or night nor any discolored secretions, no variability or overt hb symptoms. rec Minimize your perfume use if possible Change the way you take your flonase and use it automatically at bedtime and as needed during the day as per calendar   02/26/2012 f/u ov/Wert cc missed appt  04/26/2012 f/u ov/ Wert cough had resolved completely  Then  C/o new onset recurrent  cough x 3 wks- prod with thick, green sputum while still on dulera 200 2bid and ventolin not at all.  rec Augmentin x 10 days Prednisone 10 mg take  4 each am x 2 days,   2 each am x 2 days,  1 each am x2days and stop  Eliminate cyclical coughing with tramadol  07/29/2012 f/u ov/Wert cc all better except cough came back when backed off acid suppression. rec  You have mild copd and it won't progress unless you resume smoking  [facility] for generics, cash only Pulmonary f/u is as needed  08/29/2013 f/u ov/Wert re: recurrent cough while on dexilant/ pepcid/ and dulera 200 2 bid no better Chief Complaint  Patient presents with  . Follow-up    Pt c/o increased cough for the past 7-8 months. Cough is occ prod with minimal light yellow sputum.  She also c/o occ SOB and wheezing.   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 Mostly sob when coughing, no better with saba     No obvious daytime variabilty or assoc cp or chest tightness, subjective wheeze overt sinus or hb symptoms. No unusual exp hx     Sleeping ok without nocturnal  or early am exacerbation  of respiratory  c/o's or need for noct saba. Also denies any obvious fluctuation of symptoms with weather or environmental changes or other aggravating or alleviating factors except as outlined above   ROS  The following are not active complaints unless bolded sore throat, dysphagia, dental problems, itching, sneezing,  nasal congestion or excess/ purulent secretions, ear ache,   fever, chills, sweats, unintended wt loss, pleuritic or exertional cp, hemoptysis,  orthopnea pnd or leg swelling, presyncope, palpitations, heartburn, abdominal pain, anorexia, nausea, vomiting, diarrhea  or change in bowel or urinary habits, change in stools or urine, dysuria,hematuria,  rash, arthralgias, visual complaints, headache, numbness weakness or ataxia or problems with walking or coordination,  change in mood/affect or memory.                 Objective:   Physical Exam  amb bf nad   Wt 149 07/07/2011 > 08/11/2011  153>>151 08/25/2011 >    146  04/26/2012 > 155 07/29/2012   HEENT: nl dentition, turbinates, and orophanx. Nl external ear canals without cough reflex   NECK :  without JVD/Nodes/TM/ nl carotid upstrokes bilaterally   LUNGS: no acc muscle use, clear to A and P bilaterally without cough on insp or exp maneuvers   CV:  RRR  no s3 or murmur or increase in P2, no edema   ABD:  soft and nontender with nl excursion in the supine position. No bruits or organomegaly, bowel sounds nl  MS:  warm without deformities, calf tenderness, cyanosis or clubbing     CTa chest  02/08/13  No evidence of pulmonary embolus.  No worrisome lung mass detected.        Assessment & Plan:

## 2013-08-29 NOTE — Patient Instructions (Addendum)
Prednisone 10 mg take  4 each am x 2 days,   2 each am x 2 days,  1 each am x 2 days and stop    Augmentin 875 mg take one pill twice daily  X 10 days - take at breakfast and supper with large glass of water.  It would help reduce the usual side effects (diarrhea and yeast infections) if you ate cultured yogurt at lunch.   The key to effective treatment for your cough is eliminating the non-stop cycle of cough you're stuck in long enough to let your airway heal completely and then see if there is anything still making you cough once you stop the cough suppression, but this should take no more than 5 days to figure out  First take delsym two tsp every 12 hours and supplement if needed with  tramadol 50 mg up to 2 every 4 hours to suppress the urge to cough at all or even clear your throat. Swallowing water or using ice chips/non mint and menthol containing candies (such as lifesavers or sugarless jolly ranchers) are also effective.  You should rest your voice and avoid activities that you know make you cough.  Once you have eliminated the cough for 3 straight days try reducing the tramadol first,  then the delsym as tolerated.     GERD (REFLUX)  is an extremely common cause of respiratory symptoms, many times with no significant heartburn at all.    It can be treated with medication, but also with lifestyle changes including avoidance of late meals, excessive alcohol, smoking cessation, and avoid fatty foods, chocolate, peppermint, colas, red wine, and acidic juices such as orange juice.  NO MINT OR MENTHOL PRODUCTS SO NO COUGH DROPS  USE SUGARLESS CANDY INSTEAD (jolley ranchers or Stover's)  NO OIL BASED VITAMINS - use powdered substitutes.  If not better in 2 weeks >  See [name] NP w/in 2 weeks with all your medications, even over the counter meds, separated in two separate bags, the ones you take no matter what vs the ones you stop once you feel better and take only as needed when you feel you  need them.   [name]  will generate for you a new user friendly medication calendar that will put [place] all on the same page re: your medication use.     Without this process, it simply isn't possible to assure that we are providing  your outpatient care  with  the attention to detail we feel you deserve.   If we cannot assure that you're getting that kind of care,  then we cannot manage your problem effectively from this clinic.  Once you have seen [name] and we are sure that we're all on the same page with your medication use she will arrange follow up with me.

## 2013-08-30 NOTE — Assessment & Plan Note (Addendum)
-   hfa improved to 75% 07/07/11 > 100% 08/11/2011    - PFTs 07/29/2012 FEV1 1.39 (61%)  Ratio 60 and no better p B2  DLCO 55%  > 114%  DDX of  difficult airways managment all start with A and  include Adherence, Ace Inhibitors, Acid Reflux, Active Sinus Disease, Alpha 1 Antitripsin deficiency, Anxiety masquerading as Airways dz,  ABPA,  allergy(esp in young), Aspiration (esp in elderly), Adverse effects of DPI,  Active smokers, plus two Bs  = Bronchiectasis and Beta blocker use..and one C= CHF  Adherence is always the initial "prime suspect" and is a multilayered concern that requires a "trust but verify" approach in every patient - starting with knowing how to use medications, especially inhalers, correctly, keeping up with refills and understanding the fundamental difference between maintenance and prns vs those medications only taken for a very short course and then stopped and not refilled.   To keep things simple, I have asked the patient to first separate medicines that are perceived as maintenance, that is to be taken daily "no matter what", from those medicines that are taken on only on an as-needed basis and I have given the patient examples of both, and then return to see our NP to generate a  detailed  medication calendar which should be followed until the next physician sees the patient and updates it.    The proper method of use, as well as anticipated side effects, of a metered-dose inhaler are discussed and demonstrated to the patient. Improved effectiveness after extensive coaching during this visit to a level of approximately  90% so continue dulera 200 2bid  ? Acid (or non-acid) GERD > always difficult to exclude as up to 75% of pts in some series report no assoc GI/ Heartburn symptoms> rec max (24h)  acid suppression and diet restrictions/ reviewed and instructions given in writing.   ? Active sinus dz > rx augmentin x 10 days and sinus ct if not better

## 2013-09-13 ENCOUNTER — Encounter: Payer: Medicare Other | Admitting: Adult Health

## 2013-10-06 ENCOUNTER — Other Ambulatory Visit: Payer: Self-pay | Admitting: Internal Medicine

## 2013-10-25 ENCOUNTER — Emergency Department (HOSPITAL_COMMUNITY): Payer: Medicare Other

## 2013-10-25 ENCOUNTER — Emergency Department (HOSPITAL_COMMUNITY)
Admission: EM | Admit: 2013-10-25 | Discharge: 2013-10-25 | Disposition: A | Discharge status: Home / Self Care | Payer: Medicare Other | Attending: Emergency Medicine | Admitting: Emergency Medicine

## 2013-10-25 ENCOUNTER — Encounter (HOSPITAL_COMMUNITY): Payer: Self-pay | Admitting: Emergency Medicine

## 2013-10-25 DIAGNOSIS — Z90710 Acquired absence of both cervix and uterus: Secondary | ICD-10-CM | POA: Insufficient documentation

## 2013-10-25 DIAGNOSIS — Z3202 Encounter for pregnancy test, result negative: Secondary | ICD-10-CM | POA: Insufficient documentation

## 2013-10-25 DIAGNOSIS — Z87891 Personal history of nicotine dependence: Secondary | ICD-10-CM | POA: Insufficient documentation

## 2013-10-25 DIAGNOSIS — Z79899 Other long term (current) drug therapy: Secondary | ICD-10-CM | POA: Insufficient documentation

## 2013-10-25 DIAGNOSIS — J45909 Unspecified asthma, uncomplicated: Secondary | ICD-10-CM | POA: Insufficient documentation

## 2013-10-25 DIAGNOSIS — Z9089 Acquired absence of other organs: Secondary | ICD-10-CM | POA: Insufficient documentation

## 2013-10-25 DIAGNOSIS — R110 Nausea: Secondary | ICD-10-CM

## 2013-10-25 DIAGNOSIS — R112 Nausea with vomiting, unspecified: Secondary | ICD-10-CM | POA: Insufficient documentation

## 2013-10-25 DIAGNOSIS — E785 Hyperlipidemia, unspecified: Secondary | ICD-10-CM | POA: Insufficient documentation

## 2013-10-25 DIAGNOSIS — Z9889 Other specified postprocedural states: Secondary | ICD-10-CM | POA: Insufficient documentation

## 2013-10-25 DIAGNOSIS — K5900 Constipation, unspecified: Secondary | ICD-10-CM | POA: Insufficient documentation

## 2013-10-25 DIAGNOSIS — Z8719 Personal history of other diseases of the digestive system: Secondary | ICD-10-CM | POA: Insufficient documentation

## 2013-10-25 DIAGNOSIS — I10 Essential (primary) hypertension: Secondary | ICD-10-CM | POA: Insufficient documentation

## 2013-10-25 DIAGNOSIS — Z8619 Personal history of other infectious and parasitic diseases: Secondary | ICD-10-CM | POA: Insufficient documentation

## 2013-10-25 LAB — COMPREHENSIVE METABOLIC PANEL
ALK PHOS: 78 U/L (ref 39–117)
ALT: 34 U/L (ref 0–35)
AST: 43 U/L — ABNORMAL HIGH (ref 0–37)
Albumin: 4.1 g/dL (ref 3.5–5.2)
BUN: 11 mg/dL (ref 6–23)
CHLORIDE: 102 meq/L (ref 96–112)
CO2: 26 meq/L (ref 19–32)
Calcium: 9.4 mg/dL (ref 8.4–10.5)
Creatinine, Ser: 0.64 mg/dL (ref 0.50–1.10)
GLUCOSE: 98 mg/dL (ref 70–99)
POTASSIUM: 5.1 meq/L (ref 3.7–5.3)
Sodium: 139 mEq/L (ref 137–147)
Total Bilirubin: 0.3 mg/dL (ref 0.3–1.2)
Total Protein: 8.4 g/dL — ABNORMAL HIGH (ref 6.0–8.3)

## 2013-10-25 LAB — CBC WITH DIFFERENTIAL/PLATELET
Basophils Absolute: 0 10*3/uL (ref 0.0–0.1)
Basophils Relative: 0 % (ref 0–1)
Eosinophils Absolute: 0 10*3/uL (ref 0.0–0.7)
Eosinophils Relative: 1 % (ref 0–5)
HEMATOCRIT: 39.8 % (ref 36.0–46.0)
HEMOGLOBIN: 13.2 g/dL (ref 12.0–15.0)
LYMPHS ABS: 1.9 10*3/uL (ref 0.7–4.0)
Lymphocytes Relative: 36 % (ref 12–46)
MCH: 31.7 pg (ref 26.0–34.0)
MCHC: 33.2 g/dL (ref 30.0–36.0)
MCV: 95.7 fL (ref 78.0–100.0)
MONOS PCT: 6 % (ref 3–12)
Monocytes Absolute: 0.3 10*3/uL (ref 0.1–1.0)
NEUTROS ABS: 3.2 10*3/uL (ref 1.7–7.7)
Neutrophils Relative %: 58 % (ref 43–77)
Platelets: 337 10*3/uL (ref 150–400)
RBC: 4.16 MIL/uL (ref 3.87–5.11)
RDW: 12.3 % (ref 11.5–15.5)
WBC: 5.5 10*3/uL (ref 4.0–10.5)

## 2013-10-25 LAB — URINALYSIS, ROUTINE W REFLEX MICROSCOPIC
BILIRUBIN URINE: NEGATIVE
Glucose, UA: NEGATIVE mg/dL
Hgb urine dipstick: NEGATIVE
Ketones, ur: NEGATIVE mg/dL
Nitrite: NEGATIVE
PROTEIN: NEGATIVE mg/dL
SPECIFIC GRAVITY, URINE: 1.022 (ref 1.005–1.030)
Urobilinogen, UA: 1 mg/dL (ref 0.0–1.0)
pH: 7.5 (ref 5.0–8.0)

## 2013-10-25 LAB — URINE MICROSCOPIC-ADD ON

## 2013-10-25 LAB — PREGNANCY, URINE: Preg Test, Ur: NEGATIVE

## 2013-10-25 LAB — LIPASE, BLOOD: Lipase: 22 U/L (ref 11–59)

## 2013-10-25 MED ORDER — ONDANSETRON HCL 4 MG/2ML IJ SOLN
4.0000 mg | Freq: Once | INTRAMUSCULAR | Status: AC
Start: 2013-10-25 — End: 2013-10-25
  Administered 2013-10-25: 4 mg via INTRAVENOUS
  Filled 2013-10-25: qty 2

## 2013-10-25 MED ORDER — SODIUM CHLORIDE 0.9 % IV BOLUS (SEPSIS)
1000.0000 mL | Freq: Once | INTRAVENOUS | Status: AC
  Administered 2013-10-25: 1000 mL via INTRAVENOUS

## 2013-10-25 MED ORDER — ONDANSETRON HCL 4 MG PO TABS: 4.0000 mg | ORAL_TABLET | Freq: Four times a day (QID) | ORAL | Status: DC

## 2013-10-25 NOTE — ED Provider Notes (Signed)
CSN: [id number]     Arrival date & time 10/25/13  2952 History   First MD Initiated Contact with Patient 10/25/13 1013     Chief Complaint  Patient presents with  . Nausea  . Emesis  . Hernia    upper abd     (Consider location/radiation/quality/duration/timing/severity/associated sxs/prior Treatment) HPI Comments: Patient presents emergency department with chief complaint of nausea. She reports having a hiatal hernia repair in 2007. She states that she has had intermittent nausea and vomiting since the repair. She states that it is progressively worse in the past month. She denies any fevers, chills, abdominal pain, or diarrhea. She does report associated constipation. She states that her symptoms of nausea are active with palpation of the upper abdomen. She denies any acid reflux. There are no alleviating factors. The patient cannot remember who performed a hiatal hernia repair. She has not followed up with them.  The history is provided by the patient. No language interpreter was used.    Past Medical History  Diagnosis Date  . Hepatitis C   . Hyperlipidemia   . Asthma   . Herpes simplex type 2 infection   . Recurrent mouth ulceration   . Drug addiction   . Hypertension    Past Surgical History  Procedure Laterality Date  . Abdominal hysterectomy    . Cholecystectomy     Family History  Problem Relation Age of Onset  . Emphysema Father   . Heart disease Father   . Clotting disorder Father    History  Substance Use Topics  . Smoking status: Former Smoker -- 0.50 packs/day for 30 years    Types: Cigarettes    Quit date: 11/05/2006  . Smokeless tobacco: Never Used  . Alcohol Use: No   OB History   Grav Para Term Preterm Abortions TAB SAB Ect Mult Living                 Review of Systems  Constitutional: Negative for fever and chills.  Respiratory: Negative for shortness of breath.   Cardiovascular: Negative for chest pain.  Gastrointestinal: Positive for  nausea. Negative for vomiting, diarrhea and constipation.  Genitourinary: Negative for dysuria.      Allergies  Review of patient's allergies indicates no known allergies.  Home Medications   Prior to Admission medications   Medication Sig Start Date End Date Taking? Authorizing Provider  amLODipine-olmesartan (AZOR) 5-20 MG per tablet Take 1 tablet by mouth every morning.   Yes Historical Provider, MD  dexlansoprazole (DEXILANT) 60 MG capsule Take 60 mg by mouth every morning.  02/18/12  Yes [name] S Parrett, NP  DULERA 200-5 MCG/ACT AERO INHALE 2 PUFFS BY MOUTH EVERY 12 HOURS INTO THE LUNGS* MUST HAVE APPOINTMENT* 10/06/13  Yes Tanda Rockers, MD  famotidine (PEPCID AC MAXIMUM STRENGTH) 20 MG tablet Take 20 mg by mouth at bedtime.   Yes Historical Provider, MD  traMADol (ULTRAM) 50 MG tablet 1-2 every 4 hours as needed for cough or pain 08/29/13  Yes Tanda Rockers, MD  valACYclovir (VALTREX) 1000 MG tablet Take 1,000 mg by mouth 2 (two) times daily.    Yes Historical Provider, MD  simvastatin (ZOCOR) 10 MG tablet Take 10 mg by mouth at bedtime. 08/25/11 08/29/13  [name] S Parrett, NP   BP 112/71  Pulse 71  Temp(Src) 98.7 F (37.1 C) (Oral)  Resp 18  SpO2 97% Physical Exam  Nursing note and vitals reviewed. Constitutional: She is oriented to person, place, and  time. She appears well-developed and well-nourished.  HENT:  Head: Normocephalic and atraumatic.  Eyes: Conjunctivae and EOM are normal. Pupils are equal, round, and reactive to light.  Neck: Normal range of motion. Neck supple.  Cardiovascular: Normal rate and regular rhythm.  Exam reveals no gallop and no friction rub.   No murmur heard. Pulmonary/Chest: Effort normal and breath sounds normal. No respiratory distress. She has no wheezes. She has no rales. She exhibits no tenderness.  Abdominal: Soft. She exhibits no distension and no mass. There is no tenderness. There is no rebound and no guarding.  No focal abdominal  tenderness, no RLQ tenderness or pain at McBurney's point, no RUQ tenderness or Murphy's sign, no left-sided abdominal tenderness, no fluid wave, or signs of peritonitis   Musculoskeletal: Normal range of motion. She exhibits no edema and no tenderness.  Neurological: She is alert and oriented to person, place, and time.  Skin: Skin is warm and dry.  Psychiatric: She has a normal mood and affect. Her behavior is normal. Judgment and thought content normal.    ED Course  Procedures (including critical care time) Results for orders placed during the hospital encounter of 10/25/13  CBC WITH DIFFERENTIAL      Result Value Ref Range   WBC 5.5  4.0 - 10.5 K/uL   RBC 4.16  3.87 - 5.11 MIL/uL   Hemoglobin 13.2  12.0 - 15.0 g/dL   HCT 39.8  36.0 - 46.0 %   MCV 95.7  78.0 - 100.0 fL   MCH 31.7  26.0 - 34.0 pg   MCHC 33.2  30.0 - 36.0 g/dL   RDW 12.3  11.5 - 15.5 %   Platelets 337  150 - 400 K/uL   Neutrophils Relative % 58  43 - 77 %   Neutro Abs 3.2  1.7 - 7.7 K/uL   Lymphocytes Relative 36  12 - 46 %   Lymphs Abs 1.9  0.7 - 4.0 K/uL   Monocytes Relative 6  3 - 12 %   Monocytes Absolute 0.3  0.1 - 1.0 K/uL   Eosinophils Relative 1  0 - 5 %   Eosinophils Absolute 0.0  0.0 - 0.7 K/uL   Basophils Relative 0  0 - 1 %   Basophils Absolute 0.0  0.0 - 0.1 K/uL  COMPREHENSIVE METABOLIC PANEL      Result Value Ref Range   Sodium 139  137 - 147 mEq/L   Potassium 5.1  3.7 - 5.3 mEq/L   Chloride 102  96 - 112 mEq/L   CO2 26  19 - 32 mEq/L   Glucose, Bld 98  70 - 99 mg/dL   BUN 11  6 - 23 mg/dL   Creatinine, Ser 0.64  0.50 - 1.10 mg/dL   Calcium 9.4  8.4 - 10.5 mg/dL   Total Protein 8.4 (*) 6.0 - 8.3 g/dL   Albumin 4.1  3.5 - 5.2 g/dL   AST 43 (*) 0 - 37 U/L   ALT 34  0 - 35 U/L   Alkaline Phosphatase 78  39 - 117 U/L   Total Bilirubin 0.3  0.3 - 1.2 mg/dL   GFR calc non Af Amer >90  >90 mL/min   GFR calc Af Amer >90  >90 mL/min  LIPASE, BLOOD      Result Value Ref Range   Lipase 22  11  - 59 U/L  PREGNANCY, URINE      Result Value Ref Range   Preg  Test, Ur NEGATIVE  NEGATIVE  URINALYSIS, ROUTINE W REFLEX MICROSCOPIC      Result Value Ref Range   Color, Urine YELLOW  YELLOW   APPearance CLEAR  CLEAR   Specific Gravity, Urine 1.022  1.005 - 1.030   pH 7.5  5.0 - 8.0   Glucose, UA NEGATIVE  NEGATIVE mg/dL   Hgb urine dipstick NEGATIVE  NEGATIVE   Bilirubin Urine NEGATIVE  NEGATIVE   Ketones, ur NEGATIVE  NEGATIVE mg/dL   Protein, ur NEGATIVE  NEGATIVE mg/dL   Urobilinogen, UA 1.0  0.0 - 1.0 mg/dL   Nitrite NEGATIVE  NEGATIVE   Leukocytes, UA SMALL (*) NEGATIVE  URINE MICROSCOPIC-ADD ON      Result Value Ref Range   Squamous Epithelial / LPF RARE  RARE   WBC, UA 3-6  <3 WBC/hpf   RBC / HPF 0-2  <3 RBC/hpf   Bacteria, UA FEW (*) RARE   Dg Abd Acute W/chest  10/25/2013   CLINICAL DATA:  Abdominal pain with nausea. Rule out small bowel obstruction.  EXAM: ACUTE ABDOMEN SERIES (ABDOMEN 2 VIEW & CHEST 1 VIEW)  COMPARISON:  Chest 02/07/2013  FINDINGS: The lungs are clear.  No active cardiopulmonary disease.  Constipation without bowel obstruction or free air. Surgical clips in the epigastric region. Phleboliths in the pelvis. No renal calculi.  IMPRESSION: No active cardiopulmonary abnormality  Constipation.  Negative for bowel obstruction.   Electronically Signed   By: Franchot Gallo M.D.   On: 10/25/2013 11:34     Imaging Review Dg Abd Acute W/chest  10/25/2013   CLINICAL DATA:  Abdominal pain with nausea. Rule out small bowel obstruction.  EXAM: ACUTE ABDOMEN SERIES (ABDOMEN 2 VIEW & CHEST 1 VIEW)  COMPARISON:  Chest 02/07/2013  FINDINGS: The lungs are clear.  No active cardiopulmonary disease.  Constipation without bowel obstruction or free air. Surgical clips in the epigastric region. Phleboliths in the pelvis. No renal calculi.  IMPRESSION: No active cardiopulmonary abnormality  Constipation.  Negative for bowel obstruction.   Electronically Signed   By: Franchot Gallo  M.D.   On: 10/25/2013 11:34     EKG Interpretation None      MDM   Final diagnoses:  Nausea    Patient with nausea. Her labs are unremarkable today. Tolerating oral intake.  Abdomen is benign on exam.  Will give referral to GI.  Will give zofran for nausea. Patient understands and agrees with the plan.  She is stable and ready for discharge.  Patient discussed with Dr. Tamera Punt, who agrees with the plan. Patient instructed to return for:  New or worsening symptoms, including, increased abdominal pain, especially pain that localizes to one side, bloody vomit, bloody diarrhea, fever >101, and intractable vomiting.   Called in Nexium for patient.  Left prior to receiving prescription.   Montine Circle, PA-C 10/25/13 [place], PA-C 10/25/13 1402

## 2013-10-25 NOTE — ED Notes (Signed)
Pt had upper abd hernia repair in 2007, states she is having problem with it. C/o of nausea and vomited 1 month, states every time the spot is touched she gets nauseous.

## 2013-10-25 NOTE — ED Notes (Signed)
Patient transported to X-ray 

## 2013-10-25 NOTE — ED Notes (Signed)
Spoke with pt after discharge, EDP forgot to send her with a RX. Got verbal approval to send to [facility] on [address]

## 2013-10-25 NOTE — Discharge Instructions (Signed)
Nausea, Adult Nausea is the feeling that you have an upset stomach or have to vomit. Nausea by itself is not likely a serious concern, but it may be an early sign of more serious medical problems. As nausea gets worse, it can lead to vomiting. If vomiting develops, there is the risk of dehydration.  CAUSES   Viral infections.  Food poisoning.  Medicines.  Pregnancy.  Motion sickness.  Migraine headaches.  Emotional distress.  Severe pain from any source.  Alcohol intoxication. HOME CARE INSTRUCTIONS  Get plenty of rest.  Ask your caregiver about specific rehydration instructions.  Eat small amounts of food and sip liquids more often.  Take all medicines as told by your caregiver. SEEK MEDICAL CARE IF:  You have not improved after 2 days, or you get worse.  You have a headache. SEEK IMMEDIATE MEDICAL CARE IF:   You have a fever.  You faint.  You keep vomiting or have blood in your vomit.  You are extremely weak or dehydrated.  You have dark or bloody stools.  You have severe chest or abdominal pain. MAKE SURE YOU:  Understand these instructions.  Will watch your condition.  Will get help right away if you are not doing well or get worse. Document Released: 07/31/2004 Document Revised: 03/17/2012 Document Reviewed: 03/05/2011 ExitCare Patient Information 2014 [employer].  

## 2013-10-25 NOTE — ED Provider Notes (Signed)
Medical screening examination/treatment/procedure(s) were performed by non-physician practitioner and as supervising physician I was immediately available for consultation/collaboration.   EKG Interpretation None        Malvin Johns, MD 10/25/13 1503

## 2014-02-16 ENCOUNTER — Other Ambulatory Visit: Payer: Self-pay | Admitting: Nurse Practitioner

## 2014-02-16 DIAGNOSIS — C220 Liver cell carcinoma: Secondary | ICD-10-CM

## 2014-02-22 ENCOUNTER — Inpatient Hospital Stay: Admission: RE | Admit: 2014-02-22 | Payer: Medicare Other | Source: Ambulatory Visit

## 2014-03-24 ENCOUNTER — Ambulatory Visit
Admission: RE | Admit: 2014-03-24 | Discharge: 2014-03-24 | Disposition: A | Discharge status: Home / Self Care | Payer: Medicare Other | Source: Ambulatory Visit | Attending: Nurse Practitioner | Admitting: Nurse Practitioner

## 2014-03-24 DIAGNOSIS — C220 Liver cell carcinoma: Secondary | ICD-10-CM

## 2014-04-21 ENCOUNTER — Other Ambulatory Visit: Payer: Self-pay

## 2014-10-11 ENCOUNTER — Emergency Department (HOSPITAL_COMMUNITY)
Admission: EM | Admit: 2014-10-11 | Discharge: 2014-10-11 | Disposition: A | Discharge status: Home / Self Care | Payer: Medicare Other | Attending: Emergency Medicine | Admitting: Emergency Medicine

## 2014-10-11 ENCOUNTER — Emergency Department (HOSPITAL_COMMUNITY): Payer: Medicare Other

## 2014-10-11 ENCOUNTER — Encounter (HOSPITAL_COMMUNITY): Payer: Self-pay | Admitting: *Deleted

## 2014-10-11 DIAGNOSIS — Z9089 Acquired absence of other organs: Secondary | ICD-10-CM | POA: Insufficient documentation

## 2014-10-11 DIAGNOSIS — Z87891 Personal history of nicotine dependence: Secondary | ICD-10-CM | POA: Diagnosis not present

## 2014-10-11 DIAGNOSIS — Z8619 Personal history of other infectious and parasitic diseases: Secondary | ICD-10-CM | POA: Diagnosis not present

## 2014-10-11 DIAGNOSIS — E785 Hyperlipidemia, unspecified: Secondary | ICD-10-CM | POA: Diagnosis not present

## 2014-10-11 DIAGNOSIS — R1084 Generalized abdominal pain: Secondary | ICD-10-CM | POA: Diagnosis not present

## 2014-10-11 DIAGNOSIS — R1033 Periumbilical pain: Secondary | ICD-10-CM | POA: Diagnosis present

## 2014-10-11 DIAGNOSIS — Z8719 Personal history of other diseases of the digestive system: Secondary | ICD-10-CM | POA: Insufficient documentation

## 2014-10-11 DIAGNOSIS — J45909 Unspecified asthma, uncomplicated: Secondary | ICD-10-CM | POA: Insufficient documentation

## 2014-10-11 DIAGNOSIS — Z3202 Encounter for pregnancy test, result negative: Secondary | ICD-10-CM | POA: Diagnosis not present

## 2014-10-11 DIAGNOSIS — I10 Essential (primary) hypertension: Secondary | ICD-10-CM | POA: Diagnosis not present

## 2014-10-11 HISTORY — DX: Unspecified intestinal obstruction, unspecified as to partial versus complete obstruction: K56.609

## 2014-10-11 LAB — CBC WITH DIFFERENTIAL/PLATELET
BASOS PCT: 0 % (ref 0–1)
Basophils Absolute: 0 10*3/uL (ref 0.0–0.1)
EOS ABS: 0 10*3/uL (ref 0.0–0.7)
EOS PCT: 1 % (ref 0–5)
HCT: 36.9 % (ref 36.0–46.0)
Hemoglobin: 12 g/dL (ref 12.0–15.0)
Lymphocytes Relative: 41 % (ref 12–46)
Lymphs Abs: 1.7 10*3/uL (ref 0.7–4.0)
MCH: 31.3 pg (ref 26.0–34.0)
MCHC: 32.5 g/dL (ref 30.0–36.0)
MCV: 96.1 fL (ref 78.0–100.0)
Monocytes Absolute: 0.5 10*3/uL (ref 0.1–1.0)
Monocytes Relative: 12 % (ref 3–12)
Neutro Abs: 1.9 10*3/uL (ref 1.7–7.7)
Neutrophils Relative %: 46 % (ref 43–77)
PLATELETS: 296 10*3/uL (ref 150–400)
RBC: 3.84 MIL/uL — ABNORMAL LOW (ref 3.87–5.11)
RDW: 12.7 % (ref 11.5–15.5)
WBC: 4.1 10*3/uL (ref 4.0–10.5)

## 2014-10-11 LAB — URINALYSIS, ROUTINE W REFLEX MICROSCOPIC
Bilirubin Urine: NEGATIVE
GLUCOSE, UA: NEGATIVE mg/dL
HGB URINE DIPSTICK: NEGATIVE
Ketones, ur: NEGATIVE mg/dL
Leukocytes, UA: NEGATIVE
Nitrite: NEGATIVE
PROTEIN: NEGATIVE mg/dL
SPECIFIC GRAVITY, URINE: 1.012 (ref 1.005–1.030)
Urobilinogen, UA: 0.2 mg/dL (ref 0.0–1.0)
pH: 5.5 (ref 5.0–8.0)

## 2014-10-11 LAB — COMPREHENSIVE METABOLIC PANEL
ALK PHOS: 55 U/L (ref 39–117)
ALT: 32 U/L (ref 0–35)
ANION GAP: 8 (ref 5–15)
AST: 33 U/L (ref 0–37)
Albumin: 3.7 g/dL (ref 3.5–5.2)
BILIRUBIN TOTAL: 0.5 mg/dL (ref 0.3–1.2)
BUN: 7 mg/dL (ref 6–23)
CO2: 25 mmol/L (ref 19–32)
Calcium: 8.8 mg/dL (ref 8.4–10.5)
Chloride: 106 mmol/L (ref 96–112)
Creatinine, Ser: 0.64 mg/dL (ref 0.50–1.10)
GFR calc Af Amer: >90 mL/min (ref >90)
GFR calc non Af Amer: >90 mL/min (ref >90)
Glucose, Bld: 91 mg/dL (ref 70–99)
Potassium: 3.6 mmol/L (ref 3.5–5.1)
SODIUM: 139 mmol/L (ref 135–145)
Total Protein: 7.4 g/dL (ref 6.0–8.3)

## 2014-10-11 LAB — LIPASE, BLOOD: Lipase: 25 U/L (ref 11–59)

## 2014-10-11 LAB — POC URINE PREG, ED: PREG TEST UR: NEGATIVE

## 2014-10-11 MED ORDER — IOHEXOL 300 MG/ML  SOLN
100.0000 mL | Freq: Once | INTRAMUSCULAR | Status: AC | PRN
  Administered 2014-10-11: 100 mL via INTRAVENOUS

## 2014-10-11 MED ORDER — IOHEXOL 300 MG/ML  SOLN
50.0000 mL | Freq: Once | INTRAMUSCULAR | Status: AC | PRN
  Administered 2014-10-11: 50 mL via ORAL

## 2014-10-11 MED ORDER — POLYETHYLENE GLYCOL 3350 17 G PO PACK
17.0000 g | PACK | Freq: Every day | ORAL | Status: DC
Start: 2014-10-11 — End: 2015-04-08

## 2014-10-11 MED ORDER — MORPHINE SULFATE 4 MG/ML IJ SOLN
4.0000 mg | Freq: Once | INTRAMUSCULAR | Status: AC
  Administered 2014-10-11: 4 mg via INTRAVENOUS
  Filled 2014-10-11: qty 1

## 2014-10-11 MED ORDER — HYDROMORPHONE HCL 1 MG/ML IJ SOLN
1.0000 mg | Freq: Once | INTRAMUSCULAR | Status: AC
  Administered 2014-10-11: 1 mg via INTRAVENOUS
  Filled 2014-10-11: qty 1

## 2014-10-11 MED ORDER — SODIUM CHLORIDE 0.9 % IV BOLUS (SEPSIS)
1000.0000 mL | Freq: Once | INTRAVENOUS | Status: AC
  Administered 2014-10-11: 1000 mL via INTRAVENOUS

## 2014-10-11 MED ORDER — ONDANSETRON HCL 4 MG/2ML IJ SOLN
4.0000 mg | Freq: Once | INTRAMUSCULAR | Status: AC
  Administered 2014-10-11: 4 mg via INTRAVENOUS
  Filled 2014-10-11: qty 2

## 2014-10-11 NOTE — ED Notes (Signed)
Pt reports hx of small bowel obstruction and hep C. Not currently taking medications for hep c. Reports abdominal pain/cramps since yesterday. Pain 9/10. Nausea, denies vomiting. Denies dysuria.

## 2014-10-11 NOTE — ED Provider Notes (Signed)
CSN: [id number]     Arrival date & time 10/11/14  0810 History   First MD Initiated Contact with Patient 10/11/14 0831     Chief Complaint  Patient presents with  . Abdominal Pain     (Consider location/radiation/quality/duration/timing/severity/associated sxs/prior Treatment) HPI  Pt with hx of hep c, SMB presenting c/o periumblicial pain which feels cramping in nature.  She states that symptoms feel similar to when she was admitted in the past for SBO.  She has had nausea, no vomiting.  She does feel that her abdomen is more distended than usual.  Last BM 2 days ago.  No fever/chills.  No yellowing of eyes or skin, no right sided upper abdominal pain.  She currently takes no meds for hep c. Symptoms are constant and worsening.  There are no other associated systemic symptoms, there are no other alleviating or modifying factors.   Past Medical History  Diagnosis Date  . Hepatitis C   . Hyperlipidemia   . Asthma   . Herpes simplex type 2 infection   . Recurrent mouth ulceration   . Drug addiction   . Hypertension   . SBO (small bowel obstruction)     2014   Past Surgical History  Procedure Laterality Date  . Abdominal hysterectomy    . Cholecystectomy     Family History  Problem Relation Age of Onset  . Emphysema Father   . Heart disease Father   . Clotting disorder Father    History  Substance Use Topics  . Smoking status: Former Smoker -- 0.50 packs/day for 30 years    Types: Cigarettes    Quit date: 11/05/2006  . Smokeless tobacco: Never Used  . Alcohol Use: No   OB History    No data available     Review of Systems  ROS reviewed and all otherwise negative except for mentioned in HPI    Allergies  Review of patient's allergies indicates no known allergies.  Home Medications   Prior to Admission medications   Medication Sig Start Date End Date Taking? Authorizing Provider  albuterol (PROVENTIL HFA;VENTOLIN HFA) 108 (90 BASE) MCG/ACT inhaler Inhale 2 puffs  into the lungs every 6 (six) hours as needed for wheezing or shortness of breath.  07/18/14 07/18/15 Yes Historical Provider, MD  amLODipine-olmesartan (AZOR) 5-20 MG per tablet Take 1 tablet by mouth every morning.   Yes Historical Provider, MD  dexlansoprazole (DEXILANT) 60 MG capsule Take 60 mg by mouth every morning.  02/18/12  Yes [name] S Parrett, NP  simvastatin (ZOCOR) 10 MG tablet Take 10 mg by mouth at bedtime. 08/25/11 10/11/14 Yes [name] S Parrett, NP  valACYclovir (VALTREX) 1000 MG tablet Take 1,000 mg by mouth 2 (two) times daily.    Yes Historical Provider, MD  DULERA 200-5 MCG/ACT AERO INHALE 2 PUFFS BY MOUTH EVERY 12 HOURS INTO THE LUNGS* MUST HAVE APPOINTMENT* 10/06/13   Tanda Rockers, MD  ondansetron (ZOFRAN) 4 MG tablet Take 1 tablet (4 mg total) by mouth every 6 (six) hours. 10/25/13   Montine Circle, PA-C  polyethylene glycol (MIRALAX / GLYCOLAX) packet Take 17 g by mouth daily. 10/11/14   Alfonzo Beers, MD  traMADol (ULTRAM) 50 MG tablet 1-2 every 4 hours as needed for cough or pain 08/29/13   Tanda Rockers, MD   BP 130/71 mmHg  Pulse 76  Temp(Src) 98.5 F (36.9 C) (Oral)  Resp 18  SpO2 99%  Vitals reviewed Physical Exam  Physical Examination: General appearance -  alert, well appearing, and in no distress Mental status - alert, oriented to person, place, and time Eyes -  No conjunctival injection, no scleral icterus Mouth - mucous membranes moist, pharynx normal without lesions Chest - clear to auscultation, no wheezes, rales or rhonchi, symmetric air entry Heart - normal rate, regular rhythm, normal S1, S2, no murmurs, rubs, clicks or gallops Abdomen - soft, diffusely tender to palpation, nondistended, no masses or organomegaly Extremities - peripheral pulses normal, no pedal edema, no clubbing or cyanosis Skin - normal coloration and turgor, no rashes  ED Course  Procedures (including critical care time) Labs Review Labs Reviewed  CBC WITH DIFFERENTIAL/PLATELET -  Abnormal; Notable for the following:    RBC 3.84 (*)    All other components within normal limits  COMPREHENSIVE METABOLIC PANEL  LIPASE, BLOOD  URINALYSIS, ROUTINE W REFLEX MICROSCOPIC  POC URINE PREG, ED    Imaging Review Ct Abdomen Pelvis W Contrast  10/11/2014   CLINICAL DATA:  Abdominal pain and cramping since 10/10/2014. History of small bowel obstruction and hepatitis C.  EXAM: CT ABDOMEN AND PELVIS WITH CONTRAST  TECHNIQUE: Multidetector CT imaging of the abdomen and pelvis was performed using the standard protocol following bolus administration of intravenous contrast.  CONTRAST:  50 mL OMNIPAQUE IOHEXOL 300 MG/ML SOLN, 148mL OMNIPAQUE IOHEXOL 300 MG/ML SOLN  COMPARISON:  CT abdomen and pelvis 03/26/2012.  FINDINGS: Mild dependent atelectasis is seen lung bases. There is no pleural or pericardial effusion. Heart size is normal.  The gallbladder has been removed. Surgical clips in the left upper quadrant are likely related to prior Nissen fundoplication. The liver, spleen, adrenal glands, pancreas and right kidney appear normal. A simple left renal cyst measuring 2 cm in diameter is identified. The left kidney otherwise appears normal. The biliary tree is unremarkable.  The patient is status post hysterectomy and headache. Pelvic phleboliths are again seen. Small calcification in the left adnexa is incidentally noted. The small and large bowel are unremarkable. The appendix appears normal. There is no lymphadenopathy or fluid.  No focal bony abnormality is identified.  IMPRESSION: No acute finding abdomen or pelvis. No abnormality to explain the patient's symptoms.   Electronically Signed   By: Inge Rise M.D.   On: 10/11/2014 11:57     EKG Interpretation   Date/Time:  Wednesday October 11 2014 08:32:41 EDT Ventricular Rate:  71 PR Interval:  262 QRS Duration: 84 QT Interval:  407 QTC Calculation: 442 R Axis:   54 Text Interpretation:  Sinus rhythm Prolonged PR interval Borderline  T  abnormalities, anterior leads Since previous tracing first degree av block  is new Confirmed by Canary Brim  MD, MARTHA [phone]) on 10/11/2014 8:35:17 AM      MDM   Final diagnoses:  Generalized abdominal pain    Pt with hx of SBO presenting with midline abdominal pain that she states feels very similar to prior obstruction.  CT and labs reassuring, no signs of obstruction.  Pt does state that has felt constipated lateley, last BM 2 days ago will give rx for miralax.  Pt advised to arrange for followup with PMD.  Discharged with strict return precautions.  Pt agreeable with plan.    Alfonzo Beers, MD 10/11/14 1420

## 2014-10-11 NOTE — ED Notes (Signed)
Patient transported to CT 

## 2014-10-11 NOTE — Discharge Instructions (Signed)
Return to the ED with any concerns including vomiting and not able to keep down liquids, fever/chills, fainting, decreased level of alertness/lethargy, or any other alarming symptoms °

## 2015-03-08 ENCOUNTER — Encounter (HOSPITAL_COMMUNITY): Payer: Self-pay | Admitting: Emergency Medicine

## 2015-03-08 ENCOUNTER — Emergency Department (HOSPITAL_COMMUNITY): Payer: Medicare Other

## 2015-03-08 ENCOUNTER — Emergency Department (HOSPITAL_COMMUNITY)
Admission: EM | Admit: 2015-03-08 | Discharge: 2015-03-08 | Disposition: A | Discharge status: Home / Self Care | Payer: Medicare Other | Attending: Emergency Medicine | Admitting: Emergency Medicine

## 2015-03-08 DIAGNOSIS — J45909 Unspecified asthma, uncomplicated: Secondary | ICD-10-CM | POA: Insufficient documentation

## 2015-03-08 DIAGNOSIS — Z7951 Long term (current) use of inhaled steroids: Secondary | ICD-10-CM | POA: Insufficient documentation

## 2015-03-08 DIAGNOSIS — Z8719 Personal history of other diseases of the digestive system: Secondary | ICD-10-CM | POA: Insufficient documentation

## 2015-03-08 DIAGNOSIS — Z87891 Personal history of nicotine dependence: Secondary | ICD-10-CM | POA: Insufficient documentation

## 2015-03-08 DIAGNOSIS — Z79899 Other long term (current) drug therapy: Secondary | ICD-10-CM | POA: Diagnosis not present

## 2015-03-08 DIAGNOSIS — E785 Hyperlipidemia, unspecified: Secondary | ICD-10-CM | POA: Diagnosis not present

## 2015-03-08 DIAGNOSIS — I10 Essential (primary) hypertension: Secondary | ICD-10-CM | POA: Diagnosis not present

## 2015-03-08 DIAGNOSIS — R0789 Other chest pain: Secondary | ICD-10-CM | POA: Diagnosis not present

## 2015-03-08 DIAGNOSIS — Z8619 Personal history of other infectious and parasitic diseases: Secondary | ICD-10-CM | POA: Insufficient documentation

## 2015-03-08 DIAGNOSIS — R079 Chest pain, unspecified: Secondary | ICD-10-CM | POA: Diagnosis present

## 2015-03-08 LAB — CBC
HCT: 37.8 % (ref 36.0–46.0)
HEMOGLOBIN: 12.5 g/dL (ref 12.0–15.0)
MCH: 31.9 pg (ref 26.0–34.0)
MCHC: 33.1 g/dL (ref 30.0–36.0)
MCV: 96.4 fL (ref 78.0–100.0)
Platelets: 330 10*3/uL (ref 150–400)
RBC: 3.92 MIL/uL (ref 3.87–5.11)
RDW: 12.5 % (ref 11.5–15.5)
WBC: 5.5 10*3/uL (ref 4.0–10.5)

## 2015-03-08 LAB — HEPATIC FUNCTION PANEL
ALK PHOS: 65 U/L (ref 38–126)
ALT: 35 U/L (ref 14–54)
AST: 38 U/L (ref 15–41)
Albumin: 4.3 g/dL (ref 3.5–5.0)
Total Bilirubin: 0.6 mg/dL (ref 0.3–1.2)
Total Protein: 8.3 g/dL — ABNORMAL HIGH (ref 6.5–8.1)

## 2015-03-08 LAB — BASIC METABOLIC PANEL
ANION GAP: 7 (ref 5–15)
BUN: 11 mg/dL (ref 6–20)
CHLORIDE: 101 mmol/L (ref 101–111)
CO2: 28 mmol/L (ref 22–32)
Calcium: 9.6 mg/dL (ref 8.9–10.3)
Creatinine, Ser: 0.77 mg/dL (ref 0.44–1.00)
GFR calc non Af Amer: >60 mL/min (ref >60)
Glucose, Bld: 103 mg/dL — ABNORMAL HIGH (ref 65–99)
POTASSIUM: 3.9 mmol/L (ref 3.5–5.1)
SODIUM: 136 mmol/L (ref 135–145)

## 2015-03-08 LAB — I-STAT TROPONIN, ED: Troponin i, poc: 0 ng/mL (ref 0.00–0.08)

## 2015-03-08 MED ORDER — ASPIRIN 81 MG PO CHEW
324.0000 mg | CHEWABLE_TABLET | Freq: Once | ORAL | Status: AC
  Administered 2015-03-08: 324 mg via ORAL
  Filled 2015-03-08: qty 4

## 2015-03-08 MED ORDER — OXYCODONE-ACETAMINOPHEN 5-325 MG PO TABS
2.0000 | ORAL_TABLET | Freq: Once | ORAL | Status: AC
  Administered 2015-03-08: 2 via ORAL
  Filled 2015-03-08: qty 2

## 2015-03-08 NOTE — ED Notes (Signed)
Patient transported to X-ray 

## 2015-03-08 NOTE — ED Provider Notes (Signed)
CSN: [id number]     Arrival date & time 03/08/15  0804 History   First MD Initiated Contact with Patient 03/08/15 [phone]     Chief Complaint  Patient presents with  . Chest Pain     (Consider location/radiation/quality/duration/timing/severity/associated sxs/prior Treatment) HPI 49 year old female history of COPD presents today complaining of chest pain that has been present for a week. She stated it worsened last night around 11:00 and has not resolved. She describes it as in the center of her chest and burning with some pressure. She has not taken any medication for this. Denies associated symptoms such as change in sputum production, cough, dyspnea, fever, chills, nausea or vomiting. She states that she has a history of hepatitis C. She has not noted any change in this. She denies any risk factors for DVT, history of PE, history of DVT, or lateralized leg swelling. She has a history of COPD which she does not describe having worsening symptoms recently. She has been previously hospitalized for her COPD. She is not currently a smoker. She denies any trauma to the chest. Past Medical History  Diagnosis Date  . Hepatitis C   . Hyperlipidemia   . Asthma   . Herpes simplex type 2 infection   . Recurrent mouth ulceration   . Drug addiction   . Hypertension   . SBO (small bowel obstruction)     2014   Past Surgical History  Procedure Laterality Date  . Abdominal hysterectomy    . Cholecystectomy     Family History  Problem Relation Age of Onset  . Emphysema Father   . Heart disease Father   . Clotting disorder Father    Social History  Substance Use Topics  . Smoking status: Former Smoker -- 0.50 packs/day for 30 years    Types: Cigarettes    Quit date: 11/05/2006  . Smokeless tobacco: Never Used  . Alcohol Use: No   OB History    No data available     Review of Systems  All other systems reviewed and are negative.     Allergies  Review of patient's allergies indicates  no known allergies.  Home Medications   Prior to Admission medications   Medication Sig Start Date End Date Taking? Authorizing Provider  albuterol (PROVENTIL HFA;VENTOLIN HFA) 108 (90 BASE) MCG/ACT inhaler Inhale 2 puffs into the lungs every 6 (six) hours as needed for wheezing or shortness of breath.  07/18/14 07/18/15 Yes Historical Provider, MD  amLODipine-olmesartan (AZOR) 5-20 MG per tablet Take 1 tablet by mouth every morning.   Yes Historical Provider, MD  budesonide-formoterol (SYMBICORT) 160-4.5 MCG/ACT inhaler Inhale 2 puffs into the lungs 2 (two) times daily. 10/23/14 10/23/15 Yes Historical Provider, MD  dexlansoprazole (DEXILANT) 60 MG capsule Take 60 mg by mouth every morning.  02/18/12  Yes [name] S Parrett, NP  simvastatin (ZOCOR) 10 MG tablet Take 10 mg by mouth at bedtime. 08/25/11 03/08/15 Yes [name] S Parrett, NP  valACYclovir (VALTREX) 1000 MG tablet Take 1,000 mg by mouth 2 (two) times daily.    Yes Historical Provider, MD  DULERA 200-5 MCG/ACT AERO INHALE 2 PUFFS BY MOUTH EVERY 12 HOURS INTO THE LUNGS* MUST HAVE APPOINTMENT* Patient not taking: Reported on 03/08/2015 10/06/13   Tanda Rockers, MD  polyethylene glycol (MIRALAX / Floria Raveling) packet Take 17 g by mouth daily. Patient not taking: Reported on 03/08/2015 10/11/14   Alfonzo Beers, MD  traMADol (ULTRAM) 50 MG tablet 1-2 every 4 hours as needed for cough  or pain Patient not taking: Reported on 03/08/2015 08/29/13   Tanda Rockers, MD   BP 136/104 mmHg  Pulse 66  Resp 18  SpO2 97% Physical Exam  Constitutional: She is oriented to person, place, and time. She appears well-developed and well-nourished.  HENT:  Head: Normocephalic and atraumatic.  Right Ear: External ear normal.  Left Ear: External ear normal.  Nose: Nose normal.  Mouth/Throat: Oropharynx is clear and moist.  Eyes: Conjunctivae and EOM are normal. Pupils are equal, round, and reactive to light.  Neck: Normal range of motion. Neck supple.  Cardiovascular: Normal  rate, regular rhythm, normal heart sounds and intact distal pulses.   Pulmonary/Chest: Effort normal and breath sounds normal. She exhibits tenderness.    Decreased bilateral breath sounds at bases but no crackles or wheezes.  Abdominal: Soft. Bowel sounds are normal.  Musculoskeletal: Normal range of motion. She exhibits no edema or tenderness.  Neurological: She is alert and oriented to person, place, and time. She has normal reflexes.  Skin: Skin is warm and dry.  Psychiatric: She has a normal mood and affect. Her behavior is normal. Judgment and thought content normal.  Nursing note and vitals reviewed.   ED Course  Procedures (including critical care time) Labs Review Labs Reviewed  BASIC METABOLIC PANEL  CBC  HEPATIC FUNCTION PANEL    Imaging Review Dg Chest 2 View  03/08/2015   CLINICAL DATA:  Chest pain for 1 week  EXAM: CHEST  2 VIEW  COMPARISON:  10/25/2013  FINDINGS: Normal heart size and mediastinal contours. Chronic mild hyperinflation. No acute infiltrate or edema. No effusion or pneumothorax. No acute osseous findings.  IMPRESSION: No active cardiopulmonary disease.   Electronically Signed   By: Monte Fantasia M.D.   On: 03/08/2015 08:35   I have personally reviewed and evaluated these images and lab results as part of my medical decision-making.   EKG Interpretation   Date/Time:  Thursday March 08 2015 08:13:33 EDT Ventricular Rate:  72 PR Interval:  165 QRS Duration: 84 QT Interval:  417 QTC Calculation: 456 R Axis:   44 Text Interpretation:  Nonspecific ST abnormality Nonspecific T wave  abnormality now evident in v2 and v3 Confirmed by Vira Chaplin MD, Andee Poles [phone])  on 03/08/2015 8:22:13 AM      MDM   Final diagnoses:  Chest wall pain    49 year old female history of COPD presents today with anterior chest pain was produced on exam. Workup here was significant for negative troponin after 11 hours of ongoing pain. I do not see any significant EKG  changes and troponins are normal. I have a low for infection and she has not had fever or change in amount of cough or sputum production. She is perked negative with low probability of PE. I discussed return precautions and need for close follow-up with patient she was understanding.    Pattricia Boss, MD 03/08/15 1001

## 2015-03-08 NOTE — Discharge Instructions (Signed)

## 2015-03-08 NOTE — ED Notes (Signed)
Patient c/o central cp x several weeks, worsened today, along with SOB. Denies radiation of pain.

## 2015-03-15 ENCOUNTER — Encounter (HOSPITAL_COMMUNITY): Payer: Self-pay | Admitting: Emergency Medicine

## 2015-03-15 ENCOUNTER — Emergency Department (HOSPITAL_COMMUNITY)
Admission: EM | Admit: 2015-03-15 | Discharge: 2015-03-15 | Disposition: A | Discharge status: Home / Self Care | Payer: Medicare Other | Attending: Emergency Medicine | Admitting: Emergency Medicine

## 2015-03-15 ENCOUNTER — Emergency Department (HOSPITAL_COMMUNITY): Payer: Medicare Other

## 2015-03-15 DIAGNOSIS — Z8619 Personal history of other infectious and parasitic diseases: Secondary | ICD-10-CM | POA: Insufficient documentation

## 2015-03-15 DIAGNOSIS — W231XXA Caught, crushed, jammed, or pinched between stationary objects, initial encounter: Secondary | ICD-10-CM | POA: Insufficient documentation

## 2015-03-15 DIAGNOSIS — E780 Pure hypercholesterolemia: Secondary | ICD-10-CM | POA: Insufficient documentation

## 2015-03-15 DIAGNOSIS — Y998 Other external cause status: Secondary | ICD-10-CM | POA: Insufficient documentation

## 2015-03-15 DIAGNOSIS — J45909 Unspecified asthma, uncomplicated: Secondary | ICD-10-CM | POA: Diagnosis not present

## 2015-03-15 DIAGNOSIS — S92504A Nondisplaced unspecified fracture of right lesser toe(s), initial encounter for closed fracture: Secondary | ICD-10-CM | POA: Insufficient documentation

## 2015-03-15 DIAGNOSIS — Z79899 Other long term (current) drug therapy: Secondary | ICD-10-CM | POA: Diagnosis not present

## 2015-03-15 DIAGNOSIS — Z7951 Long term (current) use of inhaled steroids: Secondary | ICD-10-CM | POA: Diagnosis not present

## 2015-03-15 DIAGNOSIS — S99921A Unspecified injury of right foot, initial encounter: Secondary | ICD-10-CM | POA: Diagnosis present

## 2015-03-15 DIAGNOSIS — I10 Essential (primary) hypertension: Secondary | ICD-10-CM | POA: Insufficient documentation

## 2015-03-15 DIAGNOSIS — Y9302 Activity, running: Secondary | ICD-10-CM | POA: Diagnosis not present

## 2015-03-15 DIAGNOSIS — Z87891 Personal history of nicotine dependence: Secondary | ICD-10-CM | POA: Diagnosis not present

## 2015-03-15 DIAGNOSIS — Y9289 Other specified places as the place of occurrence of the external cause: Secondary | ICD-10-CM | POA: Diagnosis not present

## 2015-03-15 DIAGNOSIS — Z8719 Personal history of other diseases of the digestive system: Secondary | ICD-10-CM | POA: Insufficient documentation

## 2015-03-15 DIAGNOSIS — S92911A Unspecified fracture of right toe(s), initial encounter for closed fracture: Secondary | ICD-10-CM

## 2015-03-15 MED ORDER — IBUPROFEN 800 MG PO TABS: 800.0000 mg | ORAL_TABLET | Freq: Three times a day (TID) | ORAL | Status: DC

## 2015-03-15 MED ORDER — HYDROCODONE-ACETAMINOPHEN 5-325 MG PO TABS: 2.0000 | ORAL_TABLET | ORAL | Status: DC | PRN

## 2015-03-15 MED ORDER — IBUPROFEN 800 MG PO TABS
800.0000 mg | ORAL_TABLET | Freq: Once | ORAL | Status: AC
  Administered 2015-03-15: 800 mg via ORAL
  Filled 2015-03-15: qty 1

## 2015-03-15 NOTE — ED Notes (Signed)
Patient c/o pain to right 5th digit, states she was running and struck it on something @ 1 week ago. Patient states pain wont stop. Patient has not taken any medication at home for pain. Patient denies any specific interventions for pain at home.

## 2015-03-15 NOTE — ED Provider Notes (Signed)
CSN: [id number]     Arrival date & time 03/15/15  0702 History   First MD Initiated Contact with Patient 03/15/15 0703     Chief Complaint  Patient presents with  . Toe Injury     (Consider location/radiation/quality/duration/timing/severity/associated sxs/prior Treatment) HPI Ms. Brittany Sparks is a 49 y.o female with a history of asthma, hepatitis C, hyperlipidemia, hypertension, and SBO who presents for right toe injury that occurred 1 week ago when she jammed her toe into the wall while running.  She denies any treatment after hitting the toe.  Pain is worse with movement. Nothing makes it better.  She denies any numbness or tingling.  Past Medical History  Diagnosis Date  . Hepatitis C   . Hyperlipidemia   . Asthma   . Herpes simplex type 2 infection   . Recurrent mouth ulceration   . Drug addiction   . Hypertension   . SBO (small bowel obstruction)     2014   Past Surgical History  Procedure Laterality Date  . Abdominal hysterectomy    . Cholecystectomy     Family History  Problem Relation Age of Onset  . Emphysema Father   . Heart disease Father   . Clotting disorder Father    Social History  Substance Use Topics  . Smoking status: Former Smoker -- 0.50 packs/day for 30 years    Types: Cigarettes    Quit date: 11/05/2006  . Smokeless tobacco: Never Used  . Alcohol Use: No   OB History    No data available     Review of Systems  Musculoskeletal: Positive for arthralgias. Negative for joint swelling.  Skin: Negative for color change and wound.  Neurological: Negative for numbness.      Allergies  Review of patient's allergies indicates no known allergies.  Home Medications   Prior to Admission medications   Medication Sig Start Date End Date Taking? Authorizing Provider  albuterol (PROVENTIL HFA;VENTOLIN HFA) 108 (90 BASE) MCG/ACT inhaler Inhale 2 puffs into the lungs every 6 (six) hours as needed for wheezing or shortness of breath.  07/18/14 07/18/15 Yes  Historical Provider, MD  amLODipine-olmesartan (AZOR) 5-20 MG per tablet Take 1 tablet by mouth every morning.   Yes Historical Provider, MD  budesonide-formoterol (SYMBICORT) 160-4.5 MCG/ACT inhaler Inhale 2 puffs into the lungs 2 (two) times daily. 10/23/14 10/23/15 Yes Historical Provider, MD  dexlansoprazole (DEXILANT) 60 MG capsule Take 60 mg by mouth every morning.  02/18/12  Yes [name] S Parrett, NP  simvastatin (ZOCOR) 10 MG tablet Take 10 mg by mouth at bedtime.   Yes Historical Provider, MD  valACYclovir (VALTREX) 1000 MG tablet Take 1,000 mg by mouth 2 (two) times daily.    Yes Historical Provider, MD  DULERA 200-5 MCG/ACT AERO INHALE 2 PUFFS BY MOUTH EVERY 12 HOURS INTO THE LUNGS* MUST HAVE APPOINTMENT* Patient not taking: Reported on 03/08/2015 10/06/13   Tanda Rockers, MD  HYDROcodone-acetaminophen (NORCO/VICODIN) 5-325 MG per tablet Take 2 tablets by mouth every 4 (four) hours as needed. 03/15/15   Ayleah Hofmeister Patel-Mills, PA-C  ibuprofen (ADVIL,MOTRIN) 800 MG tablet Take 1 tablet (800 mg total) by mouth 3 (three) times daily. 03/15/15   Burgundy Matuszak Patel-Mills, PA-C  polyethylene glycol (MIRALAX / GLYCOLAX) packet Take 17 g by mouth daily. Patient not taking: Reported on 03/08/2015 10/11/14   Alfonzo Beers, MD  traMADol Veatrice Bourbon) 50 MG tablet 1-2 every 4 hours as needed for cough or pain Patient not taking: Reported on 03/08/2015 08/29/13   Legrand Como  B Wert, MD   BP 118/81 mmHg  Pulse 58  Temp(Src) 98.2 F (36.8 C) (Oral)  Resp 16  Ht 5' (1.524 m)  Wt 152 lb (68.947 kg)  BMI 29.69 kg/m2  SpO2 98% Physical Exam  Constitutional: She is oriented to person, place, and time. She appears well-developed and well-nourished. No distress.  HENT:  Head: Normocephalic and atraumatic.  Eyes: Conjunctivae are normal.  Neck: Neck supple.  Cardiovascular: Normal rate.   Pulmonary/Chest: Effort normal. No respiratory distress.  Musculoskeletal: Normal range of motion.  Right foot:  Able to flex and extend all toes.   2+DP pulse.  <2 second capillary refill. No deformity or ecchymosis. Tenderness to palpation of the 5th toe.   Neurological: She is alert and oriented to person, place, and time.  Skin: Skin is warm and dry.  Psychiatric: She has a normal mood and affect. Her behavior is normal.  Nursing note and vitals reviewed.   ED Course  Procedures (including critical care time) Labs Review Labs Reviewed - No data to display  Imaging Review Dg Toe 5th Right  03/15/2015   CLINICAL DATA:  Acute right fifth toe pain after hitting wall 1 week ago. Initial encounter.  EXAM: RIGHT FIFTH TOE  COMPARISON:  None.  FINDINGS: Slight cortical regularity is seen laterally in the distal portion of the fifth proximal phalanx which may represent nondisplaced fracture. This would be closed and posttraumatic. No other bony abnormality is noted. Joint spaces appear intact.  IMPRESSION: Possible nondisplaced fracture involving the fifth proximal phalanx.   Electronically Signed   By: Marijo Conception, M.D.   On: 03/15/2015 07:51   I have personally reviewed and evaluated these image results as part of my medical decision-making.   EKG Interpretation None      MDM   Final diagnoses:  Toe fracture, right, closed, initial encounter   Patient presents for 5th toe pain after injury. X-ray of the fifth toe shows possible nondisplaced fracture involving the fifth proximal phalanx. Toes were buddy taped and patient was given postop shoe. I discussed follow-up as well as return precautions with the patient. She verbally agrees with the plan. I explained that she could take ibuprofen or Tylenol for pain and hydrocodone for breakthrough pain. Medications  ibuprofen (ADVIL,MOTRIN) tablet 800 mg (800 mg Oral Given 03/15/15 0743)  Rx: hydrocodone # [place], PA-C 03/15/15 1113  Virgel Manifold, MD 03/15/15 1126

## 2015-03-15 NOTE — ED Notes (Signed)
Attempting to page ortho tech at this time.

## 2015-03-15 NOTE — ED Notes (Signed)
Pt c/o R little toe injury 1 week ago, pt states she hit toe on wall or other object but continues to have pain.

## 2015-03-15 NOTE — Discharge Instructions (Signed)
Buddy Taping of Toes Follow up with your primary care physician.  Take ibuprofen for pain and hydrocodone for breakthrough pain.  We have taped your toes together to keep them from moving. This is called "buddy taping" since we used a part of your own body to keep the injured part still. We placed soft padding between your toes to keep them from rubbing against each other. Buddy taping will help with healing and to reduce pain. Keep your toes buddy taped together for as long as directed by your caregiver. HOME CARE INSTRUCTIONS   Raise your injured area above the level of your heart while sitting or lying down. Prop it up with pillows.  An ice pack used every twenty minutes, while awake, for the first one to two days may be helpful. Put ice in a plastic bag and put a towel between the bag and your skin.  Watch for signs that the taping is too tight. These signs may be:  Numbness of your taped toes.  Coolness of your taped toes.  Color change in the area beyond the tape.  Increased pain.  If you have any of these signs, loosen or rewrap the tape. If you need to loosen or rewrap the buddy tape, make sure you use the padding again. SEEK IMMEDIATE MEDICAL CARE IF:   You have worse pain, swelling, inflammation (soreness), drainage or bleeding after you rewrap the tape.  Any new problems occur. MAKE SURE YOU:   Understand these instructions.  Will watch your condition.  Will get help right away if you are not doing well or get worse. Document Released: 03/27/2004 Document Revised: 09/15/2011 Document Reviewed: 06/20/2008 [facility] Patient Information 2015 [place], [place]. This information is not intended to replace advice given to you by your health care provider. Make sure you discuss any questions you have with your health care provider.  Toe Fracture Your caregiver has diagnosed you as having a fractured toe. A toe fracture is a break in the bone of a toe. "Buddy taping" is a way of  splinting your broken toe, by taping the broken toe to the toe next to it. This "buddy taping" will keep the injured toe from moving beyond normal range of motion. Buddy taping also helps the toe heal in a more normal alignment. It may take 6 to 8 weeks for the toe injury to heal. Crane your toes taped together for as long as directed by your caregiver or until you see a doctor for a follow-up examination. You can change the tape after bathing. Always use a small piece of gauze or cotton between the toes when taping them together. This will help the skin stay dry and prevent infection.  Apply ice to the injury for 15-20 minutes each hour while awake for the first 2 days. Put the ice in a plastic bag and place a towel between the bag of ice and your skin.  After the first 2 days, apply heat to the injured area. Use heat for the next 2 to 3 days. Place a heating pad on the foot or soak the foot in warm water as directed by your caregiver.  Keep your foot elevated as much as possible to lessen swelling.  Wear sturdy, supportive shoes. The shoes should not pinch the toes or fit tightly against the toes.  Your caregiver may prescribe a rigid shoe if your foot is very swollen.  Your may be given crutches if the pain is too  great and it hurts too much to walk.  Only take over-the-counter or prescription medicines for pain, discomfort, or fever as directed by your caregiver.  If your caregiver has given you a follow-up appointment, it is very important to keep that appointment. Not keeping the appointment could result in a chronic or permanent injury, pain, and disability. If there is any problem keeping the appointment, you must call back to this facility for assistance. SEEK MEDICAL CARE IF:   You have increased pain or swelling, not relieved with medications.  The pain does not get better after 1 week.  Your injured toe is cold when the others are warm. SEEK IMMEDIATE  MEDICAL CARE IF:   The toe becomes cold, numb, or white.  The toe becomes hot (inflamed) and red. Document Released: 06/20/2000 Document Revised: 09/15/2011 Document Reviewed: 02/07/2008 [facility] Patient Information 2015 [place], [place]. This information is not intended to replace advice given to you by your health care provider. Make sure you discuss any questions you have with your health care provider.

## 2015-04-08 ENCOUNTER — Encounter (HOSPITAL_COMMUNITY): Payer: Self-pay | Admitting: Emergency Medicine

## 2015-04-08 ENCOUNTER — Emergency Department (HOSPITAL_COMMUNITY)
Admission: EM | Admit: 2015-04-08 | Discharge: 2015-04-08 | Disposition: A | Discharge status: Home / Self Care | Payer: Medicare Other | Attending: Emergency Medicine | Admitting: Emergency Medicine

## 2015-04-08 ENCOUNTER — Emergency Department (HOSPITAL_COMMUNITY): Payer: Medicare Other

## 2015-04-08 DIAGNOSIS — R109 Unspecified abdominal pain: Secondary | ICD-10-CM | POA: Diagnosis not present

## 2015-04-08 DIAGNOSIS — Z87891 Personal history of nicotine dependence: Secondary | ICD-10-CM | POA: Insufficient documentation

## 2015-04-08 DIAGNOSIS — R140 Abdominal distension (gaseous): Secondary | ICD-10-CM | POA: Diagnosis not present

## 2015-04-08 DIAGNOSIS — R112 Nausea with vomiting, unspecified: Secondary | ICD-10-CM | POA: Diagnosis present

## 2015-04-08 DIAGNOSIS — E785 Hyperlipidemia, unspecified: Secondary | ICD-10-CM | POA: Diagnosis not present

## 2015-04-08 DIAGNOSIS — I10 Essential (primary) hypertension: Secondary | ICD-10-CM | POA: Insufficient documentation

## 2015-04-08 DIAGNOSIS — J45909 Unspecified asthma, uncomplicated: Secondary | ICD-10-CM | POA: Insufficient documentation

## 2015-04-08 DIAGNOSIS — Z9049 Acquired absence of other specified parts of digestive tract: Secondary | ICD-10-CM | POA: Diagnosis not present

## 2015-04-08 DIAGNOSIS — Z8719 Personal history of other diseases of the digestive system: Secondary | ICD-10-CM | POA: Insufficient documentation

## 2015-04-08 DIAGNOSIS — Z79899 Other long term (current) drug therapy: Secondary | ICD-10-CM | POA: Diagnosis not present

## 2015-04-08 DIAGNOSIS — Z90710 Acquired absence of both cervix and uterus: Secondary | ICD-10-CM | POA: Insufficient documentation

## 2015-04-08 DIAGNOSIS — Z8619 Personal history of other infectious and parasitic diseases: Secondary | ICD-10-CM | POA: Insufficient documentation

## 2015-04-08 DIAGNOSIS — Z7951 Long term (current) use of inhaled steroids: Secondary | ICD-10-CM | POA: Insufficient documentation

## 2015-04-08 LAB — URINALYSIS, ROUTINE W REFLEX MICROSCOPIC
Bilirubin Urine: NEGATIVE
Glucose, UA: NEGATIVE mg/dL
HGB URINE DIPSTICK: NEGATIVE
Ketones, ur: NEGATIVE mg/dL
Nitrite: NEGATIVE
Protein, ur: NEGATIVE mg/dL
SPECIFIC GRAVITY, URINE: 1.036 — AB (ref 1.005–1.030)
UROBILINOGEN UA: 0.2 mg/dL (ref 0.0–1.0)
pH: 5.5 (ref 5.0–8.0)

## 2015-04-08 LAB — CBC WITH DIFFERENTIAL/PLATELET
BASOS ABS: 0 10*3/uL (ref 0.0–0.1)
BASOS PCT: 0 %
EOS ABS: 0 10*3/uL (ref 0.0–0.7)
Eosinophils Relative: 0 %
HCT: 36 % (ref 36.0–46.0)
HEMOGLOBIN: 11.8 g/dL — AB (ref 12.0–15.0)
Lymphocytes Relative: 18 %
Lymphs Abs: 1.4 10*3/uL (ref 0.7–4.0)
MCH: 31.6 pg (ref 26.0–34.0)
MCHC: 32.8 g/dL (ref 30.0–36.0)
MCV: 96.5 fL (ref 78.0–100.0)
Monocytes Absolute: 0.7 10*3/uL (ref 0.1–1.0)
Monocytes Relative: 9 %
NEUTROS PCT: 73 %
Neutro Abs: 6 10*3/uL (ref 1.7–7.7)
Platelets: 325 10*3/uL (ref 150–400)
RBC: 3.73 MIL/uL — AB (ref 3.87–5.11)
RDW: 12.4 % (ref 11.5–15.5)
WBC: 8.2 10*3/uL (ref 4.0–10.5)

## 2015-04-08 LAB — COMPREHENSIVE METABOLIC PANEL
ALBUMIN: 3.8 g/dL (ref 3.5–5.0)
ALK PHOS: 56 U/L (ref 38–126)
ALT: 33 U/L (ref 14–54)
ANION GAP: 5 (ref 5–15)
AST: 34 U/L (ref 15–41)
BUN: 13 mg/dL (ref 6–20)
CALCIUM: 8.8 mg/dL — AB (ref 8.9–10.3)
CO2: 25 mmol/L (ref 22–32)
CREATININE: 0.62 mg/dL (ref 0.44–1.00)
Chloride: 110 mmol/L (ref 101–111)
GFR calc Af Amer: >60 mL/min (ref >60)
GFR calc non Af Amer: >60 mL/min (ref >60)
GLUCOSE: 122 mg/dL — AB (ref 65–99)
Potassium: 3.5 mmol/L (ref 3.5–5.1)
SODIUM: 140 mmol/L (ref 135–145)
Total Bilirubin: 0.6 mg/dL (ref 0.3–1.2)
Total Protein: 7.5 g/dL (ref 6.5–8.1)

## 2015-04-08 LAB — URINE MICROSCOPIC-ADD ON

## 2015-04-08 LAB — LIPASE, BLOOD: Lipase: 34 U/L (ref 22–51)

## 2015-04-08 MED ORDER — IOHEXOL 300 MG/ML  SOLN
100.0000 mL | Freq: Once | INTRAMUSCULAR | Status: AC | PRN
  Administered 2015-04-08: 100 mL via INTRAVENOUS

## 2015-04-08 MED ORDER — ONDANSETRON 4 MG PO TBDP: 4.0000 mg | ORAL_TABLET | Freq: Three times a day (TID) | ORAL | Status: DC | PRN

## 2015-04-08 MED ORDER — ONDANSETRON 4 MG PO TBDP: 4.0000 mg | ORAL_TABLET | Freq: Once | ORAL | Status: DC | PRN

## 2015-04-08 MED ORDER — MORPHINE SULFATE (PF) 4 MG/ML IV SOLN
4.0000 mg | Freq: Once | INTRAVENOUS | Status: AC
  Administered 2015-04-08: 4 mg via INTRAVENOUS
  Filled 2015-04-08: qty 1

## 2015-04-08 MED ORDER — POLYETHYLENE GLYCOL 3350 17 GM/SCOOP PO POWD: 17.0000 g | Freq: Two times a day (BID) | ORAL | Status: DC

## 2015-04-08 MED ORDER — ONDANSETRON HCL 4 MG/2ML IJ SOLN
4.0000 mg | Freq: Once | INTRAMUSCULAR | Status: AC
  Administered 2015-04-08: 4 mg via INTRAVENOUS
  Filled 2015-04-08: qty 2

## 2015-04-08 MED ORDER — IOHEXOL 300 MG/ML  SOLN
50.0000 mL | Freq: Once | INTRAMUSCULAR | Status: AC | PRN
  Administered 2015-04-08: 50 mL via ORAL

## 2015-04-08 MED ORDER — SIMETHICONE 80 MG PO CHEW: 80.0000 mg | CHEWABLE_TABLET | Freq: Four times a day (QID) | ORAL | Status: DC | PRN

## 2015-04-08 NOTE — ED Notes (Signed)
Pt c/o n/v, abd cramping, excessive gas. Pt states she had same s/s with SBO

## 2015-04-08 NOTE — ED Provider Notes (Signed)
CSN: [id number]     Arrival date & time 04/08/15  0210 History   First MD Initiated Contact with Patient 04/08/15 0254     Chief Complaint  Patient presents with  . Emesis     (Consider location/radiation/quality/duration/timing/severity/associated sxs/prior Treatment) Patient is a 49 y.o. female presenting with vomiting. The history is provided by the patient and medical records.  Emesis Associated symptoms: abdominal pain     49 year old female with history of hepatitis C, hyperlipidemia, asthma, HSV 2, hypertension, small bowel obstruction, presenting to the ED for abdominal pain, nausea, vomiting, and excess gas. Patient states symptoms initially began 3 days ago and have been progressively worsening. She states she has significant pain in her periumbilical region. She states she cannot recall the last time she had a bowel movement. She denies any fever or chills. No urinary symptoms or vaginal complaints. Patient states similar symptoms to her prior SBO-- states resolved with NG tube, did not require surgical intervention.  VSS.  Past Medical History  Diagnosis Date  . Hepatitis C   . Hyperlipidemia   . Asthma   . Herpes simplex type 2 infection   . Recurrent mouth ulceration   . Drug addiction ([place])   . Hypertension   . SBO (small bowel obstruction) ([place])     2014   Past Surgical History  Procedure Laterality Date  . Abdominal hysterectomy    . Cholecystectomy     Family History  Problem Relation Age of Onset  . Emphysema Father   . Heart disease Father   . Clotting disorder Father    Social History  Substance Use Topics  . Smoking status: Former Smoker -- 0.50 packs/day for 30 years    Types: Cigarettes    Quit date: 11/05/2006  . Smokeless tobacco: Never Used  . Alcohol Use: No   OB History    No data available     Review of Systems  Gastrointestinal: Positive for nausea, vomiting and abdominal pain.  All other systems reviewed and are  negative.     Allergies  Review of patient's allergies indicates no known allergies.  Home Medications   Prior to Admission medications   Medication Sig Start Date End Date Taking? Authorizing Provider  albuterol (PROVENTIL HFA;VENTOLIN HFA) 108 (90 BASE) MCG/ACT inhaler Inhale 2 puffs into the lungs every 6 (six) hours as needed for wheezing or shortness of breath.  07/18/14 07/18/15  Historical Provider, MD  amLODipine-olmesartan (AZOR) 5-20 MG per tablet Take 1 tablet by mouth every morning.    Historical Provider, MD  budesonide-formoterol (SYMBICORT) 160-4.5 MCG/ACT inhaler Inhale 2 puffs into the lungs 2 (two) times daily. 10/23/14 10/23/15  Historical Provider, MD  dexlansoprazole (DEXILANT) 60 MG capsule Take 60 mg by mouth every morning.  02/18/12   [name] S Parrett, NP  DULERA 200-5 MCG/ACT AERO INHALE 2 PUFFS BY MOUTH EVERY 12 HOURS INTO THE LUNGS* MUST HAVE APPOINTMENT* Patient not taking: Reported on 03/08/2015 10/06/13   Tanda Rockers, MD  HYDROcodone-acetaminophen (NORCO/VICODIN) 5-325 MG per tablet Take 2 tablets by mouth every 4 (four) hours as needed. 03/15/15   Hanna Patel-Mills, PA-C  ibuprofen (ADVIL,MOTRIN) 800 MG tablet Take 1 tablet (800 mg total) by mouth 3 (three) times daily. 03/15/15   Hanna Patel-Mills, PA-C  polyethylene glycol (MIRALAX / GLYCOLAX) packet Take 17 g by mouth daily. Patient not taking: Reported on 03/08/2015 10/11/14   Alfonzo Beers, MD  simvastatin (ZOCOR) 10 MG tablet Take 10 mg by mouth at bedtime.  Historical Provider, MD  traMADol (ULTRAM) 50 MG tablet 1-2 every 4 hours as needed for cough or pain Patient not taking: Reported on 03/08/2015 08/29/13   Tanda Rockers, MD  valACYclovir (VALTREX) 1000 MG tablet Take 1,000 mg by mouth 2 (two) times daily.     Historical Provider, MD   BP 121/88 mmHg  Pulse 89  Temp(Src) 98.6 F (37 C) (Oral)  Resp 19  Ht 5' (1.524 m)  Wt 155 lb (70.308 kg)  BMI 30.27 kg/m2  SpO2 96%   Physical Exam  Constitutional: She  is oriented to person, place, and time. She appears well-developed and well-nourished. No distress.  HENT:  Head: Normocephalic and atraumatic.  Mouth/Throat: Oropharynx is clear and moist.  Eyes: Conjunctivae and EOM are normal. Pupils are equal, round, and reactive to light.  Neck: Normal range of motion. Neck supple.  Cardiovascular: Normal rate, regular rhythm and normal heart sounds.   Pulmonary/Chest: Effort normal and breath sounds normal. No respiratory distress. She has no wheezes.  Abdominal: Soft. She exhibits distension. Bowel sounds are decreased. There is no tenderness. There is no guarding and no CVA tenderness.  Abdomen appears distended but remains soft; decreased bowel sounds; no peritonitis  Musculoskeletal: Normal range of motion. She exhibits no edema.  Neurological: She is alert and oriented to person, place, and time.  Skin: Skin is warm and dry. She is not diaphoretic.  Psychiatric: She has a normal mood and affect.  Nursing note and vitals reviewed.   ED Course  Procedures (including critical care time) Labs Review Labs Reviewed  CBC WITH DIFFERENTIAL/PLATELET - Abnormal; Notable for the following:    RBC 3.73 (*)    Hemoglobin 11.8 (*)    All other components within normal limits  COMPREHENSIVE METABOLIC PANEL - Abnormal; Notable for the following:    Glucose, Bld 122 (*)    Calcium 8.8 (*)    All other components within normal limits  URINALYSIS, ROUTINE W REFLEX MICROSCOPIC (NOT AT [facility]) - Abnormal; Notable for the following:    Specific Gravity, Urine 1.036 (*)    Leukocytes, UA SMALL (*)    All other components within normal limits  LIPASE, BLOOD  URINE MICROSCOPIC-ADD ON    Imaging Review Ct Abdomen Pelvis W Contrast  04/08/2015   CLINICAL DATA:  Abdominal cramping, excess gas. Similar symptoms with small bowel obstruction in the past. History of hepatitis-C, hypertension.  EXAM: CT ABDOMEN AND PELVIS WITH CONTRAST  TECHNIQUE: Multidetector CT  imaging of the abdomen and pelvis was performed using the standard protocol following bolus administration of intravenous contrast.  CONTRAST:  51mL OMNIPAQUE IOHEXOL 300 MG/ML SOLN, 132mL OMNIPAQUE IOHEXOL 300 MG/ML SOLN  COMPARISON:  CT abdomen and pelvis October 11, 2014  FINDINGS: LUNG BASES: Included view of the lung bases are clear. Visualized heart and pericardium are unremarkable. Mild gaseous distention of the distal esophagus is associated with reflux disease.  SOLID ORGANS: The liver, spleen, pancreas and adrenal glands are unremarkable. Status post cholecystectomy.  GASTROINTESTINAL TRACT: Surgical clips about the gastroesophageal junction suggests Nissen fundoplication. The stomach, small and large bowel are normal in course and caliber without inflammatory changes. Moderate amount of retained large bowel stool. Mild descending and sigmoid colonic diverticulosis. Normal appendix.  KIDNEYS/ URINARY TRACT: Kidneys are orthotopic, demonstrating symmetric enhancement. No nephrolithiasis, hydronephrosis or solid renal masses. The unopacified ureters are normal in course and caliber. 22 mm LEFT lower pole cyst. Too small to characterize hypodensities in the kidneys bilaterally. Delayed imaging  through the kidneys demonstrates symmetric prompt contrast excretion within the proximal urinary collecting system. Urinary bladder is partially distended and unremarkable.  PERITONEUM/RETROPERITONEUM: Aortoiliac vessels are normal in course and caliber, trace calcific atherosclerosis. No lymphadenopathy by CT size criteria. Status post hysterectomy 1 cm calcification LEFT pelvis may be adnexal. No intraperitoneal free fluid nor free air. Phleboliths in the pelvis.  SOFT TISSUE/OSSEOUS STRUCTURES: Non-suspicious. Anterior pelvic wall scarring.  IMPRESSION: No acute intra-abdominal or pelvic process. Postsurgical changes the abdomen without bowel obstruction. Moderate amount of retained large bowel stool.   Electronically  Signed   By: Elon Alas M.D.   On: 04/08/2015 04:49   I have personally reviewed and evaluated these images and lab results as part of my medical decision-making.   EKG Interpretation None      MDM   Final diagnoses:  Abdominal pain, unspecified abdominal location  Bloating   49 year old female here with abdominal pain, nausea, and vomiting. She reports increased flatulence. States similar symptoms in the past with small bowel obstruction. Patient is afebrile, nontoxic. Her abdomen does appear mildly distended with decreased bowel sounds. She is not actively vomiting. Labwork is overall reassuring. CT scan was obtained which is negative for acute findings aside from constipation. I recommended bowel regimen for patient as well as Gas-X for flatulence. She will follow-up with her primary care physician.  Discussed plan with patient, he/she acknowledged understanding and agreed with plan of care.  Return precautions given for new or worsening symptoms.  Larene Pickett, PA-C 04/08/15 [id number]  Lacretia Leigh, MD 04/10/15 [phone]

## 2015-04-08 NOTE — Discharge Instructions (Signed)
Take the prescribed medication as directed.  Use miralax until bowel movements regulate, may then use when needed for constipation. Follow-up with your primary care physician. Return to the ED for new or worsening symptoms.

## 2015-04-10 ENCOUNTER — Encounter (HOSPITAL_COMMUNITY): Payer: Self-pay | Admitting: Emergency Medicine

## 2015-04-10 ENCOUNTER — Emergency Department (HOSPITAL_COMMUNITY)
Admission: EM | Admit: 2015-04-10 | Discharge: 2015-04-10 | Disposition: A | Discharge status: Home / Self Care | Payer: Medicare Other | Attending: Emergency Medicine | Admitting: Emergency Medicine

## 2015-04-10 DIAGNOSIS — J45909 Unspecified asthma, uncomplicated: Secondary | ICD-10-CM | POA: Diagnosis not present

## 2015-04-10 DIAGNOSIS — Z87891 Personal history of nicotine dependence: Secondary | ICD-10-CM | POA: Diagnosis not present

## 2015-04-10 DIAGNOSIS — I10 Essential (primary) hypertension: Secondary | ICD-10-CM | POA: Insufficient documentation

## 2015-04-10 DIAGNOSIS — Z8619 Personal history of other infectious and parasitic diseases: Secondary | ICD-10-CM | POA: Diagnosis not present

## 2015-04-10 DIAGNOSIS — Z79899 Other long term (current) drug therapy: Secondary | ICD-10-CM | POA: Insufficient documentation

## 2015-04-10 DIAGNOSIS — E785 Hyperlipidemia, unspecified: Secondary | ICD-10-CM | POA: Insufficient documentation

## 2015-04-10 DIAGNOSIS — K5900 Constipation, unspecified: Secondary | ICD-10-CM | POA: Diagnosis not present

## 2015-04-10 DIAGNOSIS — R1084 Generalized abdominal pain: Secondary | ICD-10-CM

## 2015-04-10 MED ORDER — SENNOSIDES-DOCUSATE SODIUM 8.6-50 MG PO TABS: 2.0000 | ORAL_TABLET | Freq: Every day | ORAL | Status: DC

## 2015-04-10 MED ORDER — PEG 3350-KCL-NABCB-NACL-NASULF 240 G PO SOLR: 4000.0000 mL | Freq: Once | ORAL | Status: DC

## 2015-04-10 NOTE — ED Provider Notes (Signed)
CSN: [id number]     Arrival date & time 04/10/15  1256 History   First MD Initiated Contact with Patient 04/10/15 1320     Chief Complaint  Patient presents with  . Constipation     (Consider location/radiation/quality/duration/timing/severity/associated sxs/prior Treatment) HPI Patient presents with concern of ongoing diffuse abdominal discomfort, lack of bowel movements. Patient has had not had a bowel movement in approximately 9 days. The patient has history of constipation, the length of this episode is atypical. Patient was seen here 2 days ago, for this concern, had CT scan which was unremarkable aside from demonstrating notable colonic stool retention. Since that time the patient has follow-up with primary care, tried several suppositories, oral stool softeners, with no change in her bowel movements. She does continue to pass gas. No vomiting in 2 days, no other new complaints, including no fever, chills, chest pain.  Past Medical History  Diagnosis Date  . Hepatitis C   . Hyperlipidemia   . Asthma   . Herpes simplex type 2 infection   . Recurrent mouth ulceration   . Drug addiction ([place])   . Hypertension   . SBO (small bowel obstruction) ([place])     2014   Past Surgical History  Procedure Laterality Date  . Abdominal hysterectomy    . Cholecystectomy     Family History  Problem Relation Age of Onset  . Emphysema Father   . Heart disease Father   . Clotting disorder Father    Social History  Substance Use Topics  . Smoking status: Former Smoker -- 0.50 packs/day for 30 years    Types: Cigarettes    Quit date: 11/05/2006  . Smokeless tobacco: Never Used  . Alcohol Use: No   OB History    No data available     Review of Systems  Constitutional:       Per HPI, otherwise negative  HENT:       Per HPI, otherwise negative  Respiratory:       Per HPI, otherwise negative  Cardiovascular:       Per HPI, otherwise negative  Gastrointestinal: Positive for  abdominal pain and constipation. Negative for vomiting.  Endocrine:       Negative aside from HPI  Genitourinary:       Neg aside from HPI   Musculoskeletal:       Per HPI, otherwise negative  Skin: Negative.   Neurological: Negative for syncope.      Allergies  Review of patient's allergies indicates no known allergies.  Home Medications   Prior to Admission medications   Medication Sig Start Date End Date Taking? Authorizing Provider  albuterol (PROVENTIL HFA;VENTOLIN HFA) 108 (90 BASE) MCG/ACT inhaler Inhale 2 puffs into the lungs every 6 (six) hours as needed for wheezing or shortness of breath.  07/18/14 07/18/15  Historical Provider, MD  albuterol (PROVENTIL) (2.5 MG/3ML) 0.083% nebulizer solution Inhale 3 mLs into the lungs every 4 (four) hours as needed. For wheezing 04/02/15   Historical Provider, MD  amLODipine-olmesartan (AZOR) 5-20 MG per tablet Take 1 tablet by mouth every morning.    Historical Provider, MD  budesonide-formoterol (SYMBICORT) 160-4.5 MCG/ACT inhaler Inhale 2 puffs into the lungs 2 (two) times daily. 10/23/14 10/23/15  Historical Provider, MD  dexlansoprazole (DEXILANT) 60 MG capsule Take 60 mg by mouth every morning.  02/18/12   [name] S Parrett, NP  DULERA 200-5 MCG/ACT AERO INHALE 2 PUFFS BY MOUTH EVERY 12 HOURS INTO THE LUNGS* MUST HAVE APPOINTMENT* Patient  not taking: Reported on 03/08/2015 10/06/13   Tanda Rockers, MD  HYDROcodone-acetaminophen (NORCO/VICODIN) 5-325 MG per tablet Take 2 tablets by mouth every 4 (four) hours as needed. Patient not taking: Reported on 04/08/2015 03/15/15   Ottie Glazier, PA-C  ibuprofen (ADVIL,MOTRIN) 800 MG tablet Take 1 tablet (800 mg total) by mouth 3 (three) times daily. Patient not taking: Reported on 04/08/2015 03/15/15   Ottie Glazier, PA-C  ondansetron (ZOFRAN ODT) 4 MG disintegrating tablet Take 1 tablet (4 mg total) by mouth every 8 (eight) hours as needed for nausea. 04/08/15   Larene Pickett, PA-C  polyethylene  glycol (COLYTE) 240 G solution Take 4,000 mLs by mouth once. 04/10/15   Carmin Muskrat, MD  polyethylene glycol powder (GLYCOLAX/MIRALAX) powder Take 17 g by mouth 2 (two) times daily. Until daily soft stools  OTC 04/08/15   Larene Pickett, PA-C  senna-docusate (SENOKOT-S) 8.6-50 MG tablet Take 2 tablets by mouth daily. 04/10/15   Carmin Muskrat, MD  simethicone (GAS-X) 80 MG chewable tablet Chew 1 tablet (80 mg total) by mouth every 6 (six) hours as needed for flatulence. 04/08/15   Larene Pickett, PA-C  simvastatin (ZOCOR) 10 MG tablet Take 10 mg by mouth at bedtime.    Historical Provider, MD  traMADol (ULTRAM) 50 MG tablet 1-2 every 4 hours as needed for cough or pain Patient not taking: Reported on 03/08/2015 08/29/13   Tanda Rockers, MD  valACYclovir (VALTREX) 1000 MG tablet Take 1,000 mg by mouth 2 (two) times daily.     Historical Provider, MD   BP 102/72 mmHg  Pulse 95  Temp(Src) 97.9 F (36.6 C) (Oral)  Resp 16  SpO2 95% Physical Exam  Constitutional: She is oriented to person, place, and time. She appears well-developed and well-nourished. No distress.  HENT:  Head: Normocephalic and atraumatic.  Eyes: Conjunctivae and EOM are normal.  Cardiovascular: Normal rate and regular rhythm.   Pulmonary/Chest: Effort normal and breath sounds normal. No stridor. No respiratory distress.  Abdominal: She exhibits no distension.  Mild diffuse discomfort, without guarding, rebound, peritoneal findings  Musculoskeletal: She exhibits no edema.  Neurological: She is alert and oriented to person, place, and time. No cranial nerve deficit.  Skin: Skin is warm and dry.  Psychiatric: She has a normal mood and affect.  Nursing note and vitals reviewed.   ED Course  Procedures (including critical care time)  Prior to the patient encounter I reviewed her chart, including imaging results from 2 days ago, demonstrating CT scan with stool retention. Labs, vital signs unremarkable at that visit  I  discussed methods for increasing stool conduction with patient at length. We agreed that a trial of polyethylene glycol, discharge, with work note was a reasonable next trial. Patient will also follow up with gastrin neurology as needed.  MDM   Final diagnoses:  Generalized abdominal pain  Constipation, unspecified constipation type   presents with ongoing constipation. CT scan within the past 48 hours demonstrates stool retention, and here she is non-peritoneal, in no distress, with stable vital signs, and there is low suspicion for perforation. Given her continued passage of gas, there is low suspicion for complete obstruction, and symptoms are likely obstipation versus ileus. Patient is well-hydrated on exam, tolerant of oral liquids. Patient was started on a course of polyethylene glycol, provided GI follow-up.   Carmin Muskrat, MD 04/10/15 1410

## 2015-04-10 NOTE — ED Notes (Signed)
MD at bedside. 

## 2015-04-10 NOTE — Discharge Instructions (Signed)
As discussed, with your persistent constipation it is important that you take medication as directed, monitor your condition carefully, and do not hesitate to return for concerning changes in your condition. Otherwise, please be sure to follow-up with our gastroenterology colleagues.

## 2015-04-10 NOTE — ED Notes (Signed)
Per patient states she has not has a BM in 8-9 days-was worked up here on the 2 nd-given meds with no relief-saw PCP yesterday and was told to come here

## 2015-04-26 IMAGING — US US ABDOMEN LIMITED
1 series · 14 of 25 positions shown · non-contrast
Comparison: Radiographs 10/25/2013, ultrasound 05/11/2013 and CT
03/26/2012.

CLINICAL DATA: Hepatitis-C. Evaluate for hepatoma. History of
cholecystectomy.

EXAM:
US ABDOMEN LIMITED - RIGHT UPPER QUADRANT

[Series 1: us abdomen limited · 0.32mm/px · 14 of 30 slices shown]
[im 1/30]
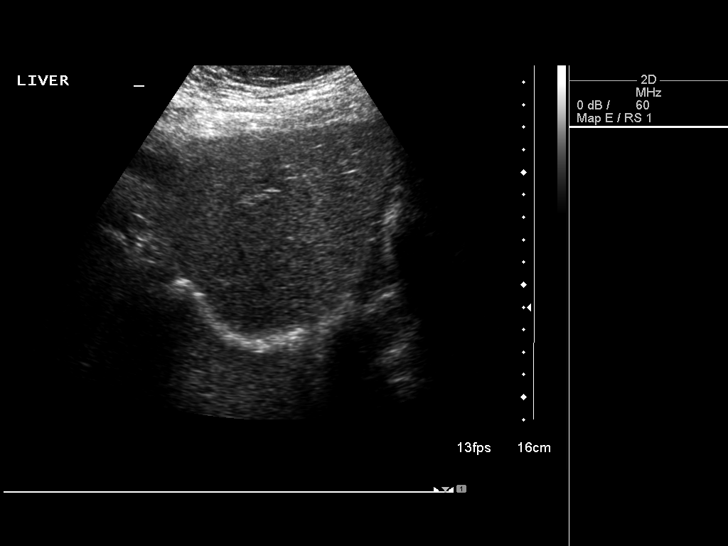
[im 3/30]
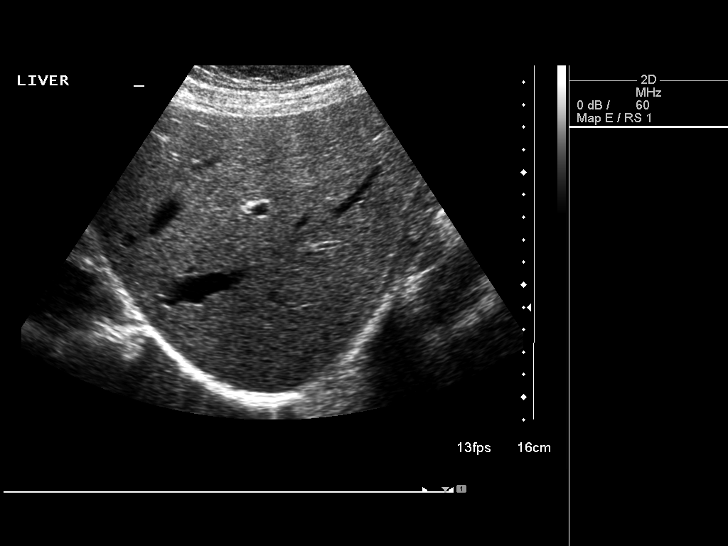
[im 5/30]
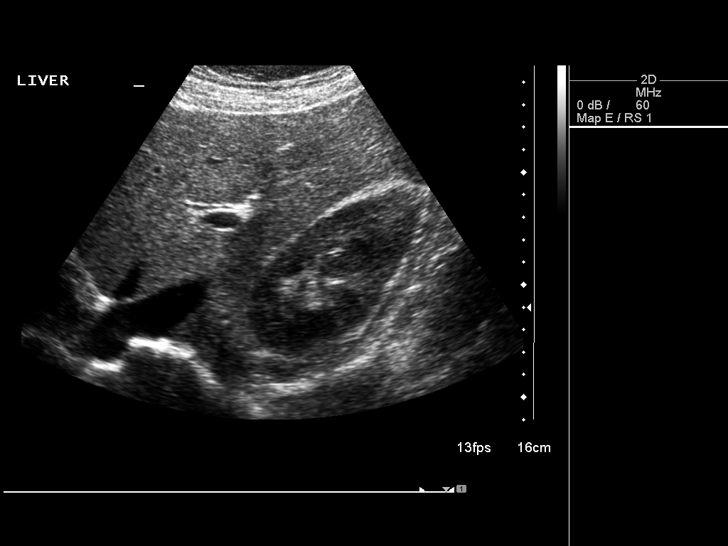
[im 8/30]
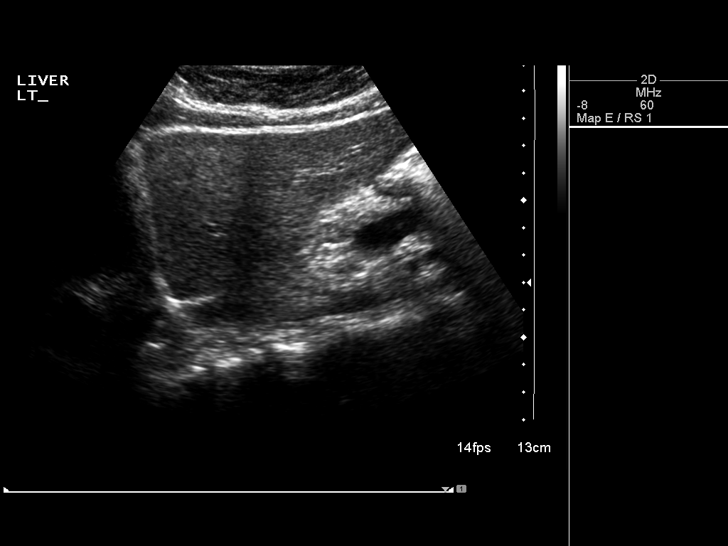
[im 10/30]
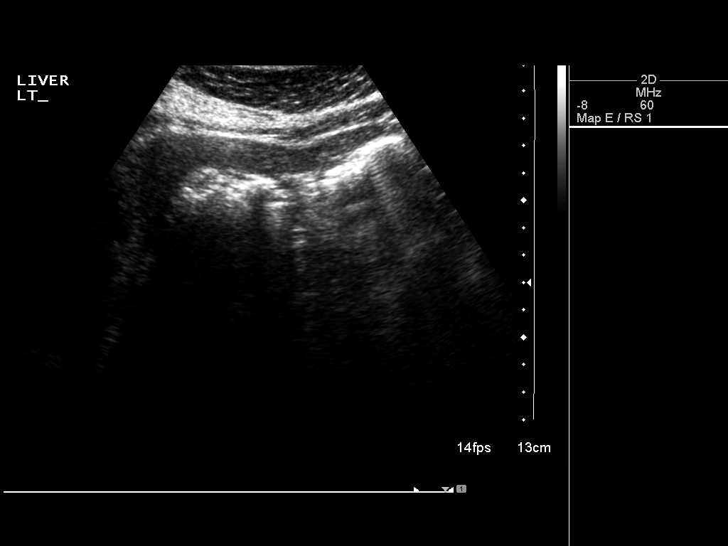
[im 11/30]
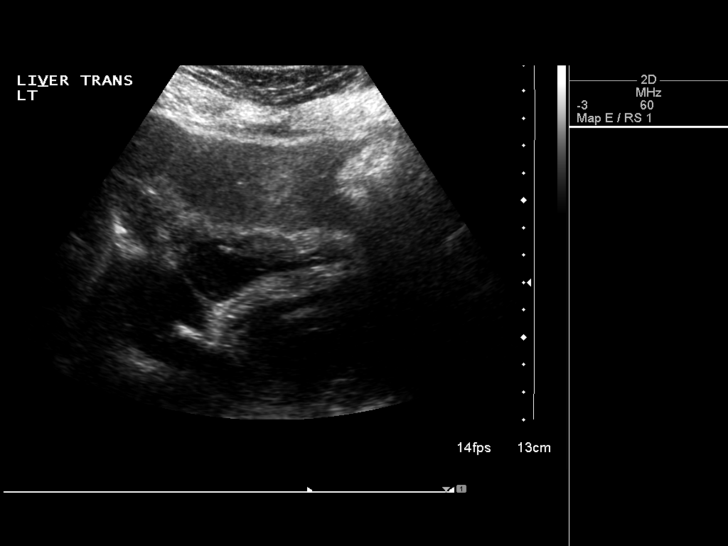
[im 14/30]
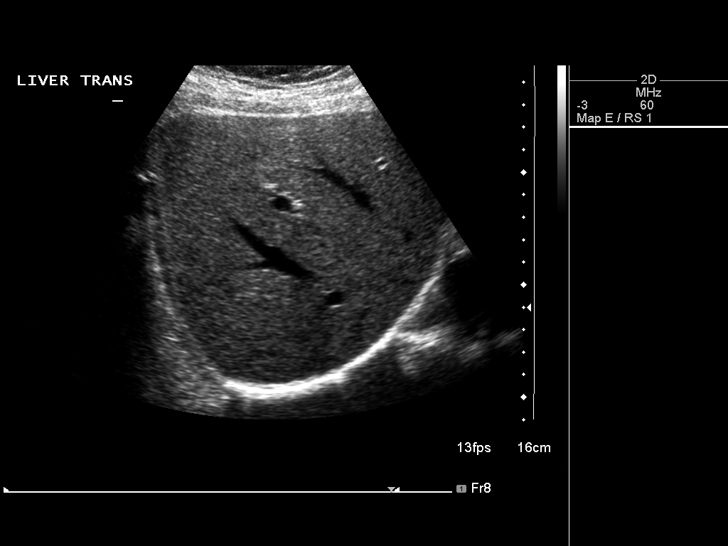
[im 16/30]
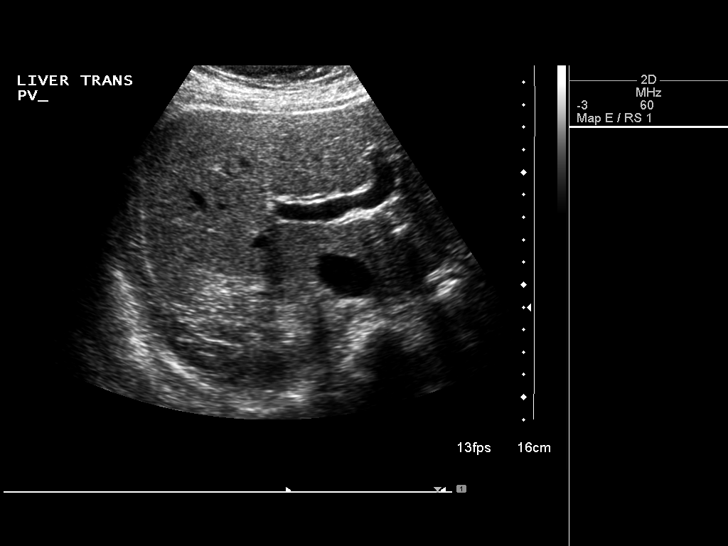
[im 19/30]
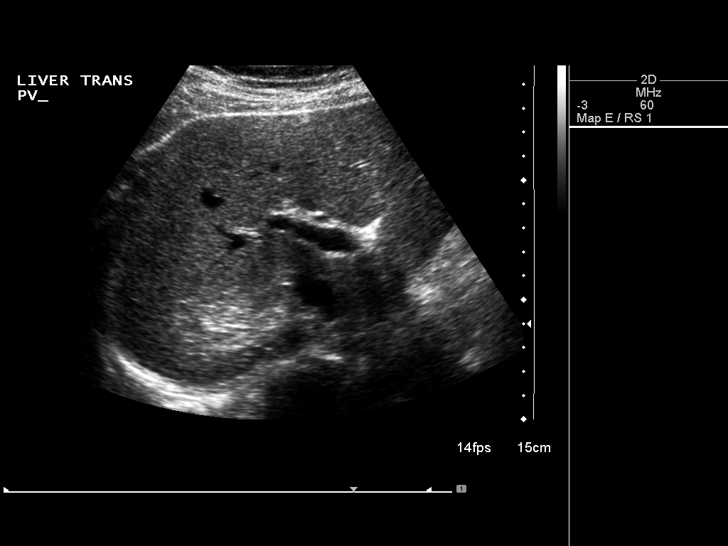
[im 20/30]
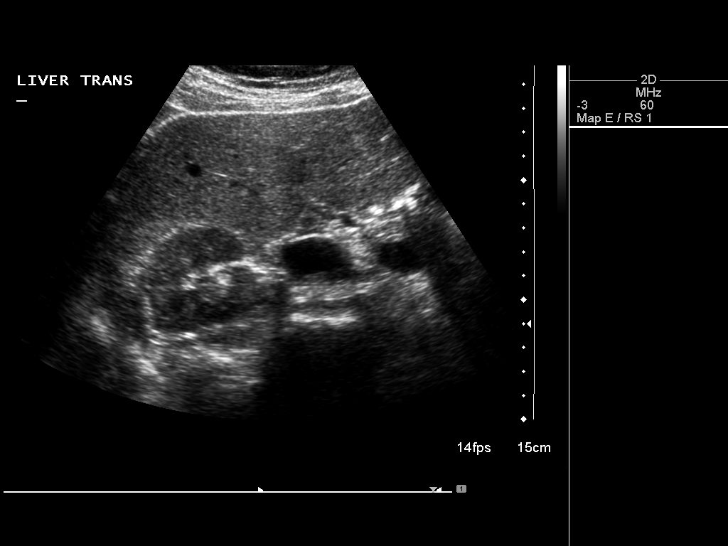
[im 22/30]
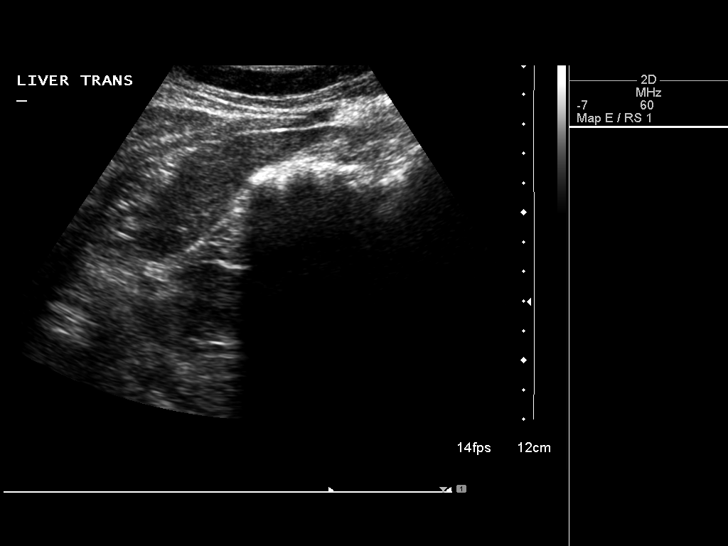
[im 25/30]
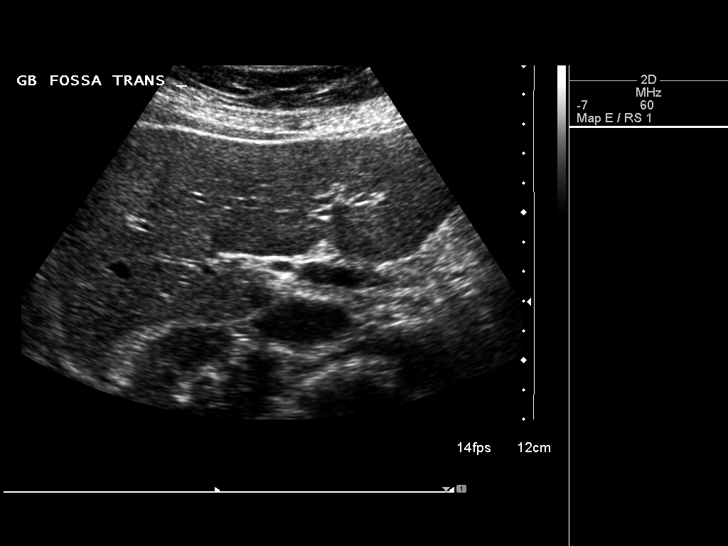
[im 27/30]
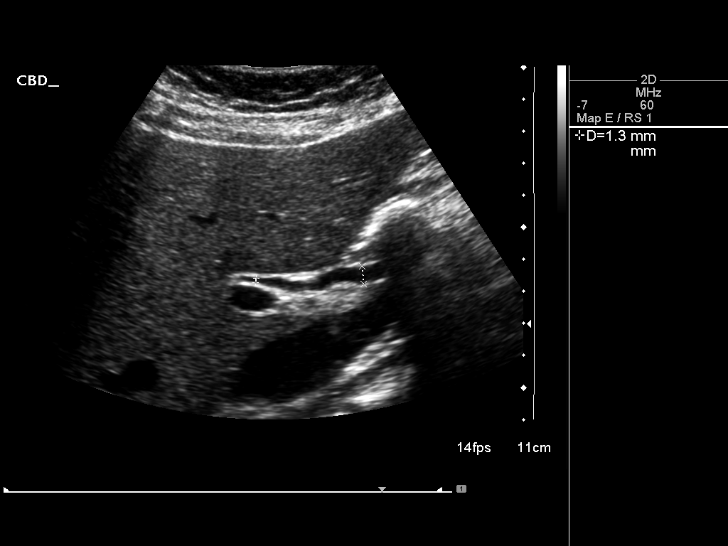
[im 30/30]
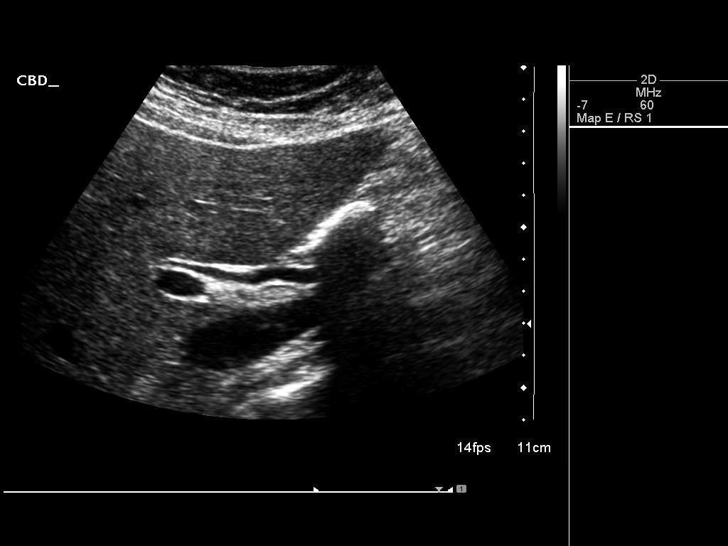

[14 of 25 positions shown; findings below may reference images not displayed]

FINDINGS: Gallbladder:

Surgically absent.

Common bile duct:

Diameter: 5.6 mm

Liver:

No focal lesion identified. Within normal limits in parenchymal
echogenicity.
IMPRESSION: Normal right upper quadrant abdominal ultrasound status post
cholecystectomy. The liver appears unremarkable by ultrasound.

## 2015-05-03 ENCOUNTER — Other Ambulatory Visit: Payer: Self-pay | Admitting: Gastroenterology

## 2015-05-03 DIAGNOSIS — K5900 Constipation, unspecified: Secondary | ICD-10-CM

## 2015-05-03 DIAGNOSIS — K219 Gastro-esophageal reflux disease without esophagitis: Secondary | ICD-10-CM

## 2015-05-11 ENCOUNTER — Ambulatory Visit
Admission: RE | Admit: 2015-05-11 | Discharge: 2015-05-11 | Disposition: A | Discharge status: Home / Self Care | Payer: Medicare Other | Source: Ambulatory Visit | Attending: Gastroenterology | Admitting: Gastroenterology

## 2015-05-11 DIAGNOSIS — K5900 Constipation, unspecified: Secondary | ICD-10-CM

## 2015-05-18 ENCOUNTER — Ambulatory Visit
Admission: RE | Admit: 2015-05-18 | Discharge: 2015-05-18 | Disposition: A | Discharge status: Home / Self Care | Payer: Medicare Other | Source: Ambulatory Visit | Attending: Gastroenterology | Admitting: Gastroenterology

## 2015-05-18 DIAGNOSIS — K219 Gastro-esophageal reflux disease without esophagitis: Secondary | ICD-10-CM

## 2015-06-11 ENCOUNTER — Telehealth: Payer: Self-pay | Admitting: Internal Medicine

## 2015-06-11 ENCOUNTER — Ambulatory Visit (INDEPENDENT_AMBULATORY_CARE_PROVIDER_SITE_OTHER): Payer: Medicare Other | Admitting: Internal Medicine

## 2015-06-11 ENCOUNTER — Encounter: Payer: Self-pay | Admitting: Internal Medicine

## 2015-06-11 VITALS — BP 118/72 | HR 82 | Ht 60.0 in | Wt 154.8 lb

## 2015-06-11 DIAGNOSIS — J449 Chronic obstructive pulmonary disease, unspecified: Secondary | ICD-10-CM | POA: Diagnosis not present

## 2015-06-11 MED ORDER — AMOXICILLIN-POT CLAVULANATE 875-125 MG PO TABS: 1.0000 | ORAL_TABLET | Freq: Two times a day (BID) | ORAL | Status: DC

## 2015-06-11 MED ORDER — PREDNISONE 10 MG PO TABS: ORAL_TABLET | ORAL | Status: DC

## 2015-06-11 MED ORDER — TRAMADOL HCL 50 MG PO TABS: ORAL_TABLET | ORAL | Status: DC

## 2015-06-11 NOTE — Progress Notes (Signed)
Subjective:    Patient ID: Brittany Sparks, female    DOB: Oct 22, 1965  MRN: [id number]     Brief patient profile:  4 yobf  quit smoking 2008 p dx of asthma by [facility] around 2000 referred 07/07/2011 to  Pulmonary clinic by Vicenta Aly with GOLD II COPD documented  07/29/12     History of Present Illness  07/07/2011 1st pulmonary cc cough x 12 years, daily no treatment for asthma has helped the cough including advair then abupt onset cough and sob much worse   x 3 weeks changed bp meds x 2 weeks ? Was this an ace inhibitor? - cough is dry more day than night but present 24 h per day, minimal sob, controls with saba but takes up to 10 puffs at a time before improves. rec Try aciphex  20mg   Take 30-60 min before first meal of the day and Pepcid 20 mg one bedtime  Symbicort 160 Take 2 puffs first thing in am and then another 2 puffs about 12 hours later.  Work on inhaler technique:  Only use your albuterol(ventolin, blue inhaler) as a rescue medication .  Best cough medication is delsym     08/29/2013 f/u ov/Bree Heinzelman re: recurrent cough while on dexilant/ pepcid/ and dulera 200 2 bid no better Chief Complaint  Patient presents with  . Follow-up    Pt c/o increased cough for the past 7-8 months. Cough is occ prod with minimal light yellow sputum.  She also c/o occ SOB and wheezing.   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 Mostly sob when coughing, no better with saba  rec Prednisone 10 mg take  4 each am x 2 days,   2 each am x 2 days,  1 each am x 2 days and stop   Augmentin 875 mg take one pill twice daily  X 10 days - take at breakfast and supper with large glass of water.  It would help reduce the usual side effects (diarrhea and yeast infections) if you ate cultured yogurt at lunch.  First take delsym two tsp every 12 hours and supplement if needed with  tramadol 50 mg up to 2 every 4  hours   GERD   If not better in 2 weeks >  See [name] NP  > did not return   06/11/2015 acute extended ov/Keddrick Wyne re: re-establish care re copd GOLD II  Chief Complaint  Patient presents with  . Follow-up    Pt c/o cough with green/brown mucus, wheeze and SOB. Pt's PCP put her on albuterol and Atrovent via nebulizer, but pt does not feel these are helping. Pt was also given a Zpak and oral pred but states these have not helped either.   was better p last rx,  Worse x 2-3 months despite symbicort and nebs but very shaky on details of care and not really sure what she has at home    No obvious daytime variabilty or assoc cp or chest tightness,   overt sinus or hb symptoms. No unusual exp hx    Sleeping ok without nocturnal  or early am exacerbation  of respiratory  c/o's or need for noct saba. Also denies any obvious fluctuation of symptoms with weather or environmental changes or other aggravating or alleviating factors except as outlined above   ROS  The following are not active complaints unless bolded sore  throat, dysphagia, dental problems, itching, sneezing,  nasal congestion or excess/ purulent secretions, ear ache,   fever, chills, sweats, unintended wt loss, pleuritic or exertional cp, hemoptysis,  orthopnea pnd or leg swelling, presyncope, palpitations, heartburn, abdominal pain, anorexia, nausea, vomiting, diarrhea  or change in bowel or urinary habits, change in stools or urine, dysuria,hematuria,  rash, arthralgias, visual complaints, headache, numbness weakness or ataxia or problems with walking or coordination,  change in mood/affect or memory.           Objective:   Physical Exam  amb bf nad harsh cough / vital signs reviewed  Wt 149 07/07/2011 > 08/11/2011  153>>151 08/25/2011 >    146  04/26/2012 > 155 07/29/2012 > 06/11/2015 155  HEENT: nl dentition, turbinates, and orophanx. Nl external ear canals without cough reflex   NECK :  without JVD/Nodes/TM/ nl carotid upstrokes  bilaterally   LUNGS: no acc muscle use, clear to A and P bilaterally without cough on insp or exp maneuvers   CV:  RRR  no s3 or murmur or increase in P2, no edema   ABD:  soft and nontender with nl excursion in the supine position. No bruits or organomegaly, bowel sounds nl  MS:  warm without deformities, calf tenderness, cyanosis or clubbing        I personally reviewed images and agree with radiology impression as follows:  CXR:  03/08/15 No active cardiopulmonary disease.  Dg Es 05/18/15 Pos GERD        Assessment & Plan:

## 2015-06-11 NOTE — Telephone Encounter (Signed)
Called spoke with pt and appt scheduled. Nothing further needed 

## 2015-06-11 NOTE — Telephone Encounter (Signed)
Dr. Melvyn Novas do you want to overbook?

## 2015-06-11 NOTE — Patient Instructions (Addendum)
Prednisone 10 mg take  4 each am x 2 days,   2 each am x 2 days,  1 each am x 2 days and stop    Augmentin 875 mg take one pill twice daily  X 10 days - take at breakfast and supper with large glass of water.  It would help reduce the usual side effects (diarrhea and yeast infections) if you ate cultured yogurt at lunch.   The key to effective treatment for your cough is eliminating the non-stop cycle of cough you're stuck in long enough to let your airway heal completely and then see if there is anything still making you cough once you stop the cough suppression, but this should take no more than 5 days to figure out  First take mucinex dm 1200  every 12 hours and supplement if needed with  tramadol 50 mg up to 2 every 4 hours to suppress the urge to cough at all or even clear your throat. Swallowing water or using ice chips/non mint and menthol containing candies (such as lifesavers or sugarless jolly ranchers) are also effective.  You should rest your voice and avoid activities that you know make you cough.  Once you have eliminated the cough for 3 straight days try reducing the tramadol first,  then the mucinex dm  as tolerated.    Continue dexilant before bfast and pepcid ac 20 mg at bedtime    GERD (REFLUX)  is an extremely common cause of respiratory symptoms, many times with no significant heartburn at all.    It can be treated with medication, but also with lifestyle changes including avoidance of late meals, excessive alcohol, smoking cessation, and avoid fatty foods, chocolate, peppermint, colas, red wine, and acidic juices such as orange juice.  NO MINT OR MENTHOL PRODUCTS SO NO COUGH DROPS  USE SUGARLESS CANDY INSTEAD (jolley ranchers or Stover's)  NO OIL BASED VITAMINS - use powdered substitutes.  Please schedule a follow up office visit in 2 weeks, sooner if needed - bring all medications / inhalers /neb solutions with you

## 2015-06-11 NOTE — Telephone Encounter (Signed)
Yes that's fine 

## 2015-06-17 ENCOUNTER — Encounter: Payer: Self-pay | Admitting: Internal Medicine

## 2015-06-17 NOTE — Assessment & Plan Note (Addendum)
-   hfa improved to 75% 07/07/11 > 100% 08/11/2011    - PFTs 07/29/2012 FEV1 1.39 (61%)  Ratio 60 and no better p B2  DLCO 55%  > 114%  Very poorly controlled symptoms ? Etiology.  DDX of  difficult airways management all start with A and  include Adherence, Ace Inhibitors, Acid Reflux, Active Sinus Disease, Alpha 1 Antitripsin deficiency, Anxiety masquerading as Airways dz,  ABPA,  allergy(esp in young), Aspiration (esp in elderly), Adverse effects of meds,  Active smokers, A bunch of PE's (a small clot burden can't cause this syndrome unless there is already severe underlying pulm or vascular dz with poor reserve) plus two Bs  = Bronchiectasis and Beta blocker use..and one C= CHF  Adherence is always the initial "prime suspect" and is a multilayered concern that requires a "trust but verify" approach in every patient - starting with knowing how to use medications, especially inhalers, correctly, keeping up with refills and understanding the fundamental difference between maintenance and prns vs those medications only taken for a very short course and then stopped and not refilled.  - thoroughly confused with details of care > simply to neb qid prn for now and return in 2 weeks with all meds  ? Acid (or non-acid) GERD > always difficult to exclude as up to 75% of pts in some series report no assoc GI/ Heartburn symptoms> rec max (24h)  acid suppression and diet restrictions/ reviewed and instructions given in writing.   ? Active sinus dz >Augmentin 875 mg take one pill twice daily  X 10 days - take at breakfast and supper with large glass of water.  It would help reduce the usual side effects (diarrhea and yeast infections) if you ate cultured yogurt at lunch.   ? Allergy > Prednisone 10 mg take  4 each am x 2 days,   2 each am x 2 days,  1 each am x 2 days and stop   I had an extended discussion with the patient reviewing all relevant studies completed to date and  lasting 25 minutes of a 40 minute  acute/ externded re-establish care office visit    Each maintenance medication was reviewed in detail including most importantly the difference between maintenance and prns and under what circumstances the prns are to be triggered using an action plan format that is not reflected in the computer generated alphabetically organized AVS.    Please see instructions for details which were reviewed in writing and the patient given a copy highlighting the part that I personally wrote and discussed at today's ov.

## 2015-06-17 NOTE — Assessment & Plan Note (Signed)
Complicated by  HBP/ hyperlididemia   Body mass index is 30.23 kg/(m^2).  No results found for: TSH   Contributing to gerd tendency/ doe/reviewed the need and the process to achieve and maintain neg calorie balance > defer f/u primary care including intermittently monitoring thyroid status

## 2015-06-26 ENCOUNTER — Ambulatory Visit: Payer: Medicare Other | Admitting: Internal Medicine

## 2015-06-28 ENCOUNTER — Ambulatory Visit: Payer: Medicare Other | Admitting: Internal Medicine

## 2015-10-19 ENCOUNTER — Emergency Department (HOSPITAL_COMMUNITY)
Admission: EM | Admit: 2015-10-19 | Discharge: 2015-10-19 | Disposition: A | Discharge status: Home / Self Care | Payer: Medicare Other | Attending: Emergency Medicine | Admitting: Emergency Medicine

## 2015-10-19 ENCOUNTER — Encounter (HOSPITAL_COMMUNITY): Payer: Self-pay | Admitting: Emergency Medicine

## 2015-10-19 ENCOUNTER — Emergency Department (HOSPITAL_COMMUNITY): Payer: Medicare Other

## 2015-10-19 DIAGNOSIS — E785 Hyperlipidemia, unspecified: Secondary | ICD-10-CM | POA: Insufficient documentation

## 2015-10-19 DIAGNOSIS — Z87891 Personal history of nicotine dependence: Secondary | ICD-10-CM | POA: Insufficient documentation

## 2015-10-19 DIAGNOSIS — Z9049 Acquired absence of other specified parts of digestive tract: Secondary | ICD-10-CM | POA: Insufficient documentation

## 2015-10-19 DIAGNOSIS — K219 Gastro-esophageal reflux disease without esophagitis: Secondary | ICD-10-CM | POA: Diagnosis not present

## 2015-10-19 DIAGNOSIS — J449 Chronic obstructive pulmonary disease, unspecified: Secondary | ICD-10-CM | POA: Diagnosis not present

## 2015-10-19 DIAGNOSIS — Z90710 Acquired absence of both cervix and uterus: Secondary | ICD-10-CM | POA: Insufficient documentation

## 2015-10-19 DIAGNOSIS — Z792 Long term (current) use of antibiotics: Secondary | ICD-10-CM | POA: Insufficient documentation

## 2015-10-19 DIAGNOSIS — R110 Nausea: Secondary | ICD-10-CM | POA: Diagnosis not present

## 2015-10-19 DIAGNOSIS — Z8619 Personal history of other infectious and parasitic diseases: Secondary | ICD-10-CM | POA: Diagnosis not present

## 2015-10-19 DIAGNOSIS — I10 Essential (primary) hypertension: Secondary | ICD-10-CM | POA: Insufficient documentation

## 2015-10-19 DIAGNOSIS — Z79899 Other long term (current) drug therapy: Secondary | ICD-10-CM | POA: Diagnosis not present

## 2015-10-19 DIAGNOSIS — R109 Unspecified abdominal pain: Secondary | ICD-10-CM | POA: Insufficient documentation

## 2015-10-19 HISTORY — DX: Constipation, unspecified: K59.00

## 2015-10-19 HISTORY — DX: Chronic obstructive pulmonary disease, unspecified: J44.9

## 2015-10-19 HISTORY — DX: Gastro-esophageal reflux disease without esophagitis: K21.9

## 2015-10-19 LAB — URINALYSIS, ROUTINE W REFLEX MICROSCOPIC
Bilirubin Urine: NEGATIVE
Glucose, UA: NEGATIVE mg/dL
HGB URINE DIPSTICK: NEGATIVE
KETONES UR: NEGATIVE mg/dL
LEUKOCYTES UA: NEGATIVE
Nitrite: NEGATIVE
PROTEIN: NEGATIVE mg/dL
Specific Gravity, Urine: 1.009 (ref 1.005–1.030)
pH: 6 (ref 5.0–8.0)

## 2015-10-19 LAB — CBC WITH DIFFERENTIAL/PLATELET
BASOS ABS: 0 10*3/uL (ref 0.0–0.1)
BASOS PCT: 0 %
EOS ABS: 0.1 10*3/uL (ref 0.0–0.7)
EOS PCT: 1 %
HCT: 38.2 % (ref 36.0–46.0)
HEMOGLOBIN: 12.6 g/dL (ref 12.0–15.0)
LYMPHS ABS: 2 10*3/uL (ref 0.7–4.0)
Lymphocytes Relative: 45 %
MCH: 31.8 pg (ref 26.0–34.0)
MCHC: 33 g/dL (ref 30.0–36.0)
MCV: 96.5 fL (ref 78.0–100.0)
Monocytes Absolute: 0.4 10*3/uL (ref 0.1–1.0)
Monocytes Relative: 10 %
NEUTROS PCT: 44 %
Neutro Abs: 2 10*3/uL (ref 1.7–7.7)
PLATELETS: 290 10*3/uL (ref 150–400)
RBC: 3.96 MIL/uL (ref 3.87–5.11)
RDW: 12.6 % (ref 11.5–15.5)
WBC: 4.5 10*3/uL (ref 4.0–10.5)

## 2015-10-19 LAB — COMPREHENSIVE METABOLIC PANEL
ALT: 35 U/L (ref 14–54)
AST: 37 U/L (ref 15–41)
Albumin: 4.2 g/dL (ref 3.5–5.0)
Alkaline Phosphatase: 63 U/L (ref 38–126)
Anion gap: 7 (ref 5–15)
BUN: 14 mg/dL (ref 6–20)
CALCIUM: 9.3 mg/dL (ref 8.9–10.3)
CO2: 25 mmol/L (ref 22–32)
CREATININE: 0.8 mg/dL (ref 0.44–1.00)
Chloride: 106 mmol/L (ref 101–111)
GFR calc non Af Amer: >60 mL/min (ref >60)
Glucose, Bld: 94 mg/dL (ref 65–99)
Potassium: 4.6 mmol/L (ref 3.5–5.1)
SODIUM: 138 mmol/L (ref 135–145)
TOTAL PROTEIN: 7.5 g/dL (ref 6.5–8.1)
Total Bilirubin: 0.5 mg/dL (ref 0.3–1.2)

## 2015-10-19 LAB — LIPASE, BLOOD: LIPASE: 28 U/L (ref 11–51)

## 2015-10-19 MED ORDER — NAPROXEN 250 MG PO TABS: 250.0000 mg | ORAL_TABLET | Freq: Two times a day (BID) | ORAL | Status: DC | PRN

## 2015-10-19 MED ORDER — METHOCARBAMOL 500 MG PO TABS: 1000.0000 mg | ORAL_TABLET | Freq: Four times a day (QID) | ORAL | Status: DC | PRN

## 2015-10-19 MED ORDER — KETOROLAC TROMETHAMINE 60 MG/2ML IM SOLN
60.0000 mg | Freq: Once | INTRAMUSCULAR | Status: AC
  Administered 2015-10-19: 60 mg via INTRAMUSCULAR
  Filled 2015-10-19: qty 2

## 2015-10-19 NOTE — ED Notes (Signed)
Pt c/o intermittent RLQ abdominal pain and nausea x "several weeks."  Currently, denies pain.  Denies n/v/d.  Pt sts coughing increases pain.

## 2015-10-19 NOTE — ED Notes (Signed)
Patient transported to CT 

## 2015-10-19 NOTE — Discharge Instructions (Signed)
Take the prescriptions as directed.  Apply moist heat or ice to the area(s) of discomfort, for 15 minutes at a time, several times per day for the next few days.  Do not fall asleep on a heating or ice pack.  Call your regular medical doctor on Monday to schedule a follow up appointment next week.  Return to the Emergency Department immediately if worsening. ° °

## 2015-10-19 NOTE — ED Provider Notes (Signed)
CSN: [id number]     Arrival date & time 10/19/15  0827 History   First MD Initiated Contact with Patient 10/19/15 [phone]     Chief Complaint  Patient presents with  . Abdominal Pain      HPI Pt was seen at [place].  Per pt, c/o gradual onset and persistence of constant right sided abd "pain" for the past several weeks. Has been associated with nausea. Describes the pain as "not my usual abd pain" which is located in her mid-epigastric area. Denies vomiting/diarrhea, no fevers, no back pain, no rash, no CP/SOB, no black or blood in stools, no dysuria/hematuria.      Past Medical History  Diagnosis Date  . Hepatitis C   . Hyperlipidemia   . Asthma   . Herpes simplex type 2 infection   . Recurrent mouth ulceration   . Drug addiction ([place])   . Hypertension   . SBO (small bowel obstruction) ([place])     2014  . Constipation   . GERD (gastroesophageal reflux disease)   . COPD (chronic obstructive pulmonary disease) [facility])    Past Surgical History  Procedure Laterality Date  . Abdominal hysterectomy    . Cholecystectomy     Family History  Problem Relation Age of Onset  . Emphysema Father   . Heart disease Father   . Clotting disorder Father    Social History  Substance Use Topics  . Smoking status: Former Smoker -- 0.50 packs/day for 30 years    Types: Cigarettes    Quit date: 11/05/2006  . Smokeless tobacco: Never Used  . Alcohol Use: No    Review of Systems ROS: Statement: All systems negative except as marked or noted in the HPI; Constitutional: Negative for fever and chills. ; ; Eyes: Negative for eye pain, redness and discharge. ; ; ENMT: Negative for ear pain, hoarseness, nasal congestion, sinus pressure and sore throat. ; ; Cardiovascular: Negative for chest pain, palpitations, diaphoresis, dyspnea and peripheral edema. ; ; Respiratory: Negative for cough, wheezing and stridor. ; ; Gastrointestinal: +nausea, abd pain. Negative for vomiting, diarrhea, blood in stool,  hematemesis, jaundice and rectal bleeding. . ; ; Genitourinary: Negative for dysuria, flank pain and hematuria. ; ; Musculoskeletal: Negative for back pain and neck pain. Negative for swelling and trauma.; ; Skin: Negative for pruritus, rash, abrasions, blisters, bruising and skin lesion.; ; Neuro: Negative for headache, lightheadedness and neck stiffness. Negative for weakness, altered level of consciousness , altered mental status, extremity weakness, paresthesias, involuntary movement, seizure and syncope.      Allergies  Review of patient's allergies indicates no known allergies.  Home Medications   Prior to Admission medications   Medication Sig Start Date End Date Taking? Authorizing Provider  albuterol (PROVENTIL) (2.5 MG/3ML) 0.083% nebulizer solution Inhale 3 mLs into the lungs every 4 (four) hours as needed. For wheezing 04/02/15   Historical Provider, MD  amLODipine-olmesartan (AZOR) 5-20 MG per tablet Take 1 tablet by mouth every morning.    Historical Provider, MD  amoxicillin-clavulanate (AUGMENTIN) 875-125 MG tablet Take 1 tablet by mouth 2 (two) times daily. 06/11/15   Tanda Rockers, MD  dexlansoprazole (DEXILANT) 60 MG capsule Take 60 mg by mouth every morning.  02/18/12   [name] S Parrett, NP  ipratropium (ATROVENT) 0.02 % nebulizer solution Inhale into the lungs 3 (three) times daily. INHALE 1 VIAL VIA NEBULIZER 3 TIMES A DAY 06/06/15   Historical Provider, MD  ondansetron (ZOFRAN ODT) 4 MG disintegrating tablet Take 1  tablet (4 mg total) by mouth every 8 (eight) hours as needed for nausea. 04/08/15   Larene Pickett, PA-C  polyethylene glycol powder (GLYCOLAX/MIRALAX) powder Take 17 g by mouth 2 (two) times daily. Until daily soft stools  OTC 04/08/15   Larene Pickett, PA-C  predniSONE (DELTASONE) 10 MG tablet Take  4 each am x 2 days,   2 each am x 2 days,  1 each am x 2 days and stop 06/11/15   Tanda Rockers, MD  senna-docusate (SENOKOT-S) 8.6-50 MG tablet Take 2 tablets by mouth  daily. 04/10/15   Carmin Muskrat, MD  simethicone (GAS-X) 80 MG chewable tablet Chew 1 tablet (80 mg total) by mouth every 6 (six) hours as needed for flatulence. 04/08/15   Larene Pickett, PA-C  simvastatin (ZOCOR) 10 MG tablet Take 10 mg by mouth at bedtime.    Historical Provider, MD  traMADol Veatrice Bourbon) 50 MG tablet 1-2 every 4 hours as needed for cough or pain 06/11/15   Tanda Rockers, MD  valACYclovir (VALTREX) 1000 MG tablet Take 1,000 mg by mouth 2 (two) times daily.     Historical Provider, MD   BP 120/98 mmHg  Pulse 68  Temp(Src) 98.8 F (37.1 C) (Oral)  Resp 16  SpO2 98% Physical Exam  0850: Physical examination:  Nursing notes reviewed; Vital signs and O2 SAT reviewed;  Constitutional: Well developed, Well nourished, Well hydrated, In no acute distress; Head:  Normocephalic, atraumatic; Eyes: EOMI, PERRL, No scleral icterus; ENMT: Mouth and pharynx normal, Mucous membranes moist; Neck: Supple, Full range of motion, No lymphadenopathy; Cardiovascular: Regular rate and rhythm, No murmur, rub, or gallop; Respiratory: Breath sounds clear & equal bilaterally, No rales, rhonchi, wheezes.  Speaking full sentences with ease, Normal respiratory effort/excursion; Chest: Nontender, Movement normal; Abdomen: Soft, +right sided lateral torso tender to palp. Abd NT. Nondistended, Normal bowel sounds; Genitourinary: No CVA tenderness; Extremities: Pulses normal, No tenderness, No edema, No calf edema or asymmetry.; Neuro: AA&Ox3, Major CN grossly intact.  Speech clear. No gross focal motor or sensory deficits in extremities.; Skin: Color normal, Warm, Dry.   ED Course  Procedures (including critical care time) Labs Review  Imaging Review  I have personally reviewed and evaluated these images and lab results as part of my medical decision-making.   EKG Interpretation None      MDM  MDM Reviewed: previous chart, nursing note and vitals Reviewed previous: labs Interpretation: labs and CT  scan      Results for orders placed or performed during the hospital encounter of 10/19/15  Urinalysis, Routine w reflex microscopic  Result Value Ref Range   Color, Urine YELLOW YELLOW   APPearance CLEAR CLEAR   Specific Gravity, Urine 1.009 1.005 - 1.030   pH 6.0 5.0 - 8.0   Glucose, UA NEGATIVE NEGATIVE mg/dL   Hgb urine dipstick NEGATIVE NEGATIVE   Bilirubin Urine NEGATIVE NEGATIVE   Ketones, ur NEGATIVE NEGATIVE mg/dL   Protein, ur NEGATIVE NEGATIVE mg/dL   Nitrite NEGATIVE NEGATIVE   Leukocytes, UA NEGATIVE NEGATIVE  Comprehensive metabolic panel  Result Value Ref Range   Sodium 138 135 - 145 mmol/L   Potassium 4.6 3.5 - 5.1 mmol/L   Chloride 106 101 - 111 mmol/L   CO2 25 22 - 32 mmol/L   Glucose, Bld 94 65 - 99 mg/dL   BUN 14 6 - 20 mg/dL   Creatinine, Ser 0.80 0.44 - 1.00 mg/dL   Calcium 9.3 8.9 - 10.3 mg/dL  Total Protein 7.5 6.5 - 8.1 g/dL   Albumin 4.2 3.5 - 5.0 g/dL   AST 37 15 - 41 U/L   ALT 35 14 - 54 U/L   Alkaline Phosphatase 63 38 - 126 U/L   Total Bilirubin 0.5 0.3 - 1.2 mg/dL   GFR calc non Af Amer >60 >60 mL/min   GFR calc Af Amer >60 >60 mL/min   Anion gap 7 5 - 15  Lipase, blood  Result Value Ref Range   Lipase 28 11 - 51 U/L  CBC with Differential  Result Value Ref Range   WBC 4.5 4.0 - 10.5 K/uL   RBC 3.96 3.87 - 5.11 MIL/uL   Hemoglobin 12.6 12.0 - 15.0 g/dL   HCT 38.2 36.0 - 46.0 %   MCV 96.5 78.0 - 100.0 fL   MCH 31.8 26.0 - 34.0 pg   MCHC 33.0 30.0 - 36.0 g/dL   RDW 12.6 11.5 - 15.5 %   Platelets 290 150 - 400 K/uL   Neutrophils Relative % 44 %   Neutro Abs 2.0 1.7 - 7.7 K/uL   Lymphocytes Relative 45 %   Lymphs Abs 2.0 0.7 - 4.0 K/uL   Monocytes Relative 10 %   Monocytes Absolute 0.4 0.1 - 1.0 K/uL   Eosinophils Relative 1 %   Eosinophils Absolute 0.1 0.0 - 0.7 K/uL   Basophils Relative 0 %   Basophils Absolute 0.0 0.0 - 0.1 K/uL   Ct Renal Stone Study 10/19/2015  CLINICAL DATA:  Intermittent RIGHT lower quadrant pain and  nausea for several weeks, increased pain with coughing, no history of kidney stones; history small bowel obstruction, hypertension, GERD, COPD, former smoker, asthma EXAM: CT ABDOMEN AND PELVIS WITHOUT CONTRAST TECHNIQUE: Multidetector CT imaging of the abdomen and pelvis was performed following the standard protocol without IV contrast. Sagittal and coronal MPR images reconstructed from axial data set. Oral contrast not administered for this indication. COMPARISON:  04/08/2015 FINDINGS: Lung bases clear. Gallbladder surgically absent. Cyst inferior pole LEFT kidney 2.3 x 2.2 cm image 32. Mild rotation RIGHT kidney, hilum directed anteriorly. No urinary tract calcification, hydronephrosis, or ureteral dilatation. Unremarkable ureters and bladder. Within limits of a nonenhanced exam no focal abnormalities of the liver, spleen, pancreas, or adrenal glands. Normal appendix. Few sigmoid diverticula without evidence of diverticulitis. Stomach and bowel loops otherwise normal appearance. No mass, adenopathy, free air, free fluid, hernia, or definite inflammatory process. Numerous pelvic phleboliths. Uterus surgically absent with nonvisualization of ovaries. Osseous structures unremarkable. IMPRESSION: Small LEFT renal cyst. No acute intra-abdominal or intrapelvic abnormalities. Electronically Signed   By: Lavonia Dana M.D.   On: 10/19/2015 09:26    1230:  Workup reassuring. Pt states she is ready to go home now. Tx symptomatically, f/u PMD. Dx and testing d/w pt.  Questions answered.  Verb understanding, agreeable to d/c home with outpt f/u.   Francine Graven, DO 10/21/15 1419

## 2016-04-09 IMAGING — CR DG CHEST 2V
2 series · 2 of 2 positions shown · non-contrast
Comparison: 10/25/2013

CLINICAL DATA: Chest pain for 1 week

EXAM:
CHEST  2 VIEW

[w chest pa]
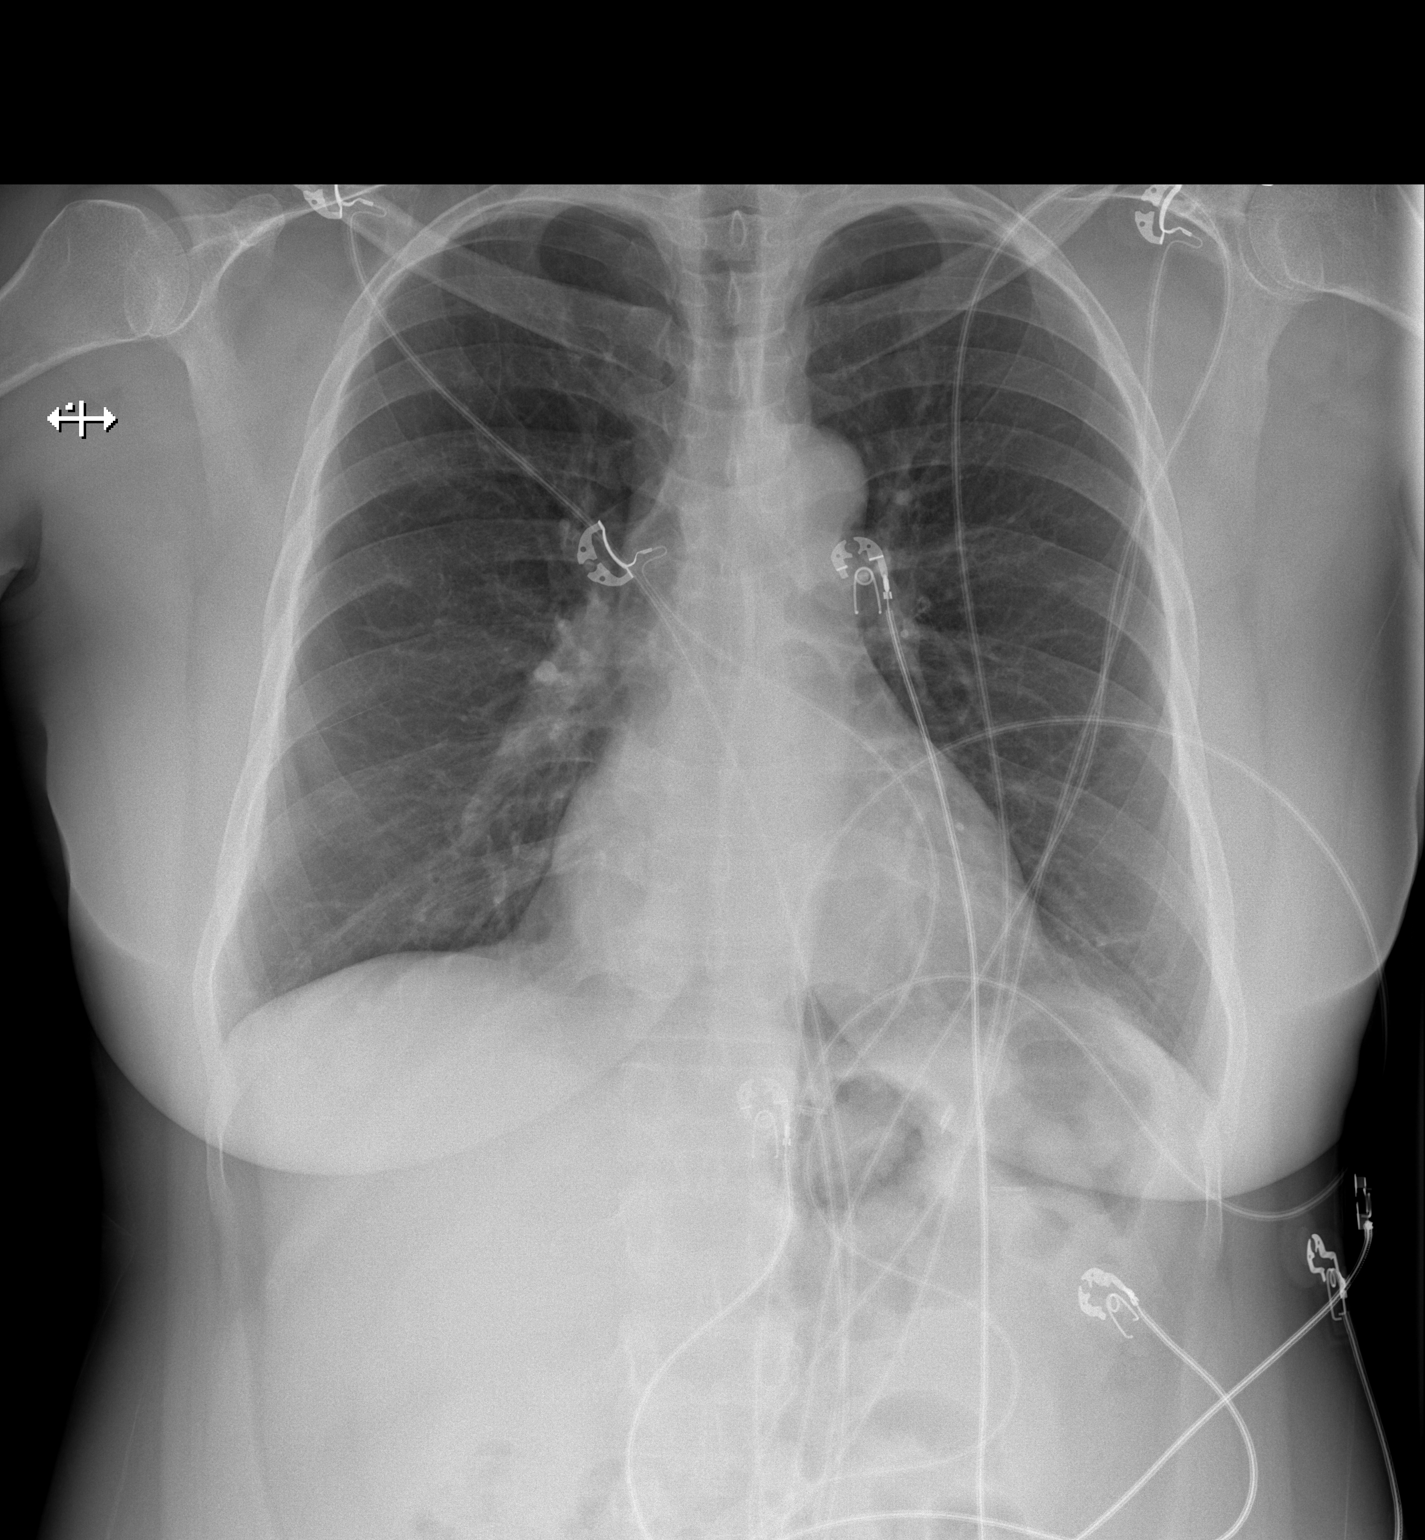

[w chest lat]
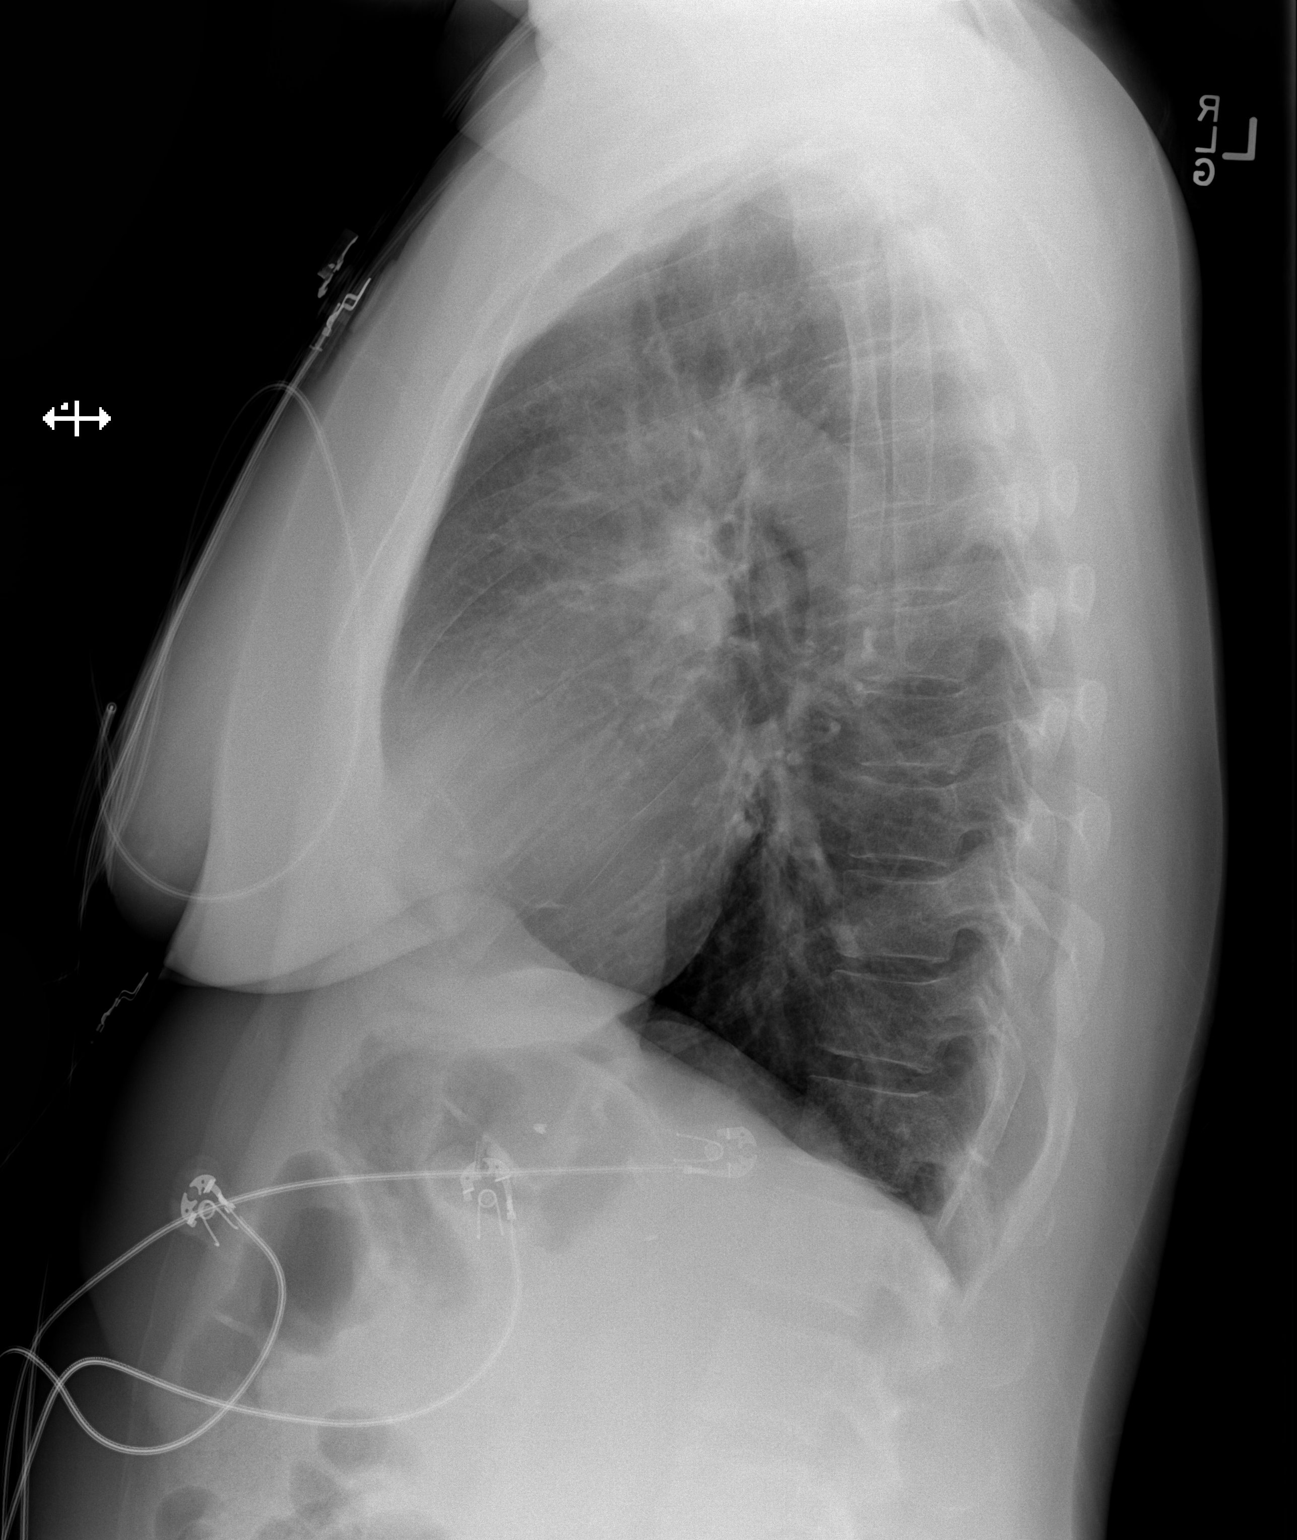

[2 of 2 positions shown; findings below may reference images not displayed]

FINDINGS: Normal heart size and mediastinal contours. Chronic mild
hyperinflation. No acute infiltrate or edema. No effusion or
pneumothorax. No acute osseous findings.
IMPRESSION: No active cardiopulmonary disease.

## 2016-10-28 ENCOUNTER — Emergency Department (HOSPITAL_COMMUNITY)
Admission: EM | Admit: 2016-10-28 | Discharge: 2016-10-29 | Disposition: A | Discharge status: Home / Self Care | Payer: Medicare Other | Attending: Emergency Medicine | Admitting: Emergency Medicine

## 2016-10-28 ENCOUNTER — Encounter (HOSPITAL_COMMUNITY): Payer: Self-pay | Admitting: *Deleted

## 2016-10-28 ENCOUNTER — Emergency Department (HOSPITAL_COMMUNITY): Payer: Medicare Other

## 2016-10-28 DIAGNOSIS — Z87891 Personal history of nicotine dependence: Secondary | ICD-10-CM | POA: Insufficient documentation

## 2016-10-28 DIAGNOSIS — Z79899 Other long term (current) drug therapy: Secondary | ICD-10-CM | POA: Insufficient documentation

## 2016-10-28 DIAGNOSIS — R0789 Other chest pain: Secondary | ICD-10-CM | POA: Insufficient documentation

## 2016-10-28 DIAGNOSIS — R079 Chest pain, unspecified: Secondary | ICD-10-CM | POA: Diagnosis present

## 2016-10-28 DIAGNOSIS — J449 Chronic obstructive pulmonary disease, unspecified: Secondary | ICD-10-CM | POA: Insufficient documentation

## 2016-10-28 DIAGNOSIS — I10 Essential (primary) hypertension: Secondary | ICD-10-CM | POA: Insufficient documentation

## 2016-10-28 LAB — BASIC METABOLIC PANEL
Anion gap: 8 (ref 5–15)
BUN: 13 mg/dL (ref 6–20)
CHLORIDE: 105 mmol/L (ref 101–111)
CO2: 25 mmol/L (ref 22–32)
Calcium: 9.2 mg/dL (ref 8.9–10.3)
Creatinine, Ser: 0.79 mg/dL (ref 0.44–1.00)
GFR calc Af Amer: >60 mL/min (ref >60)
GFR calc non Af Amer: >60 mL/min (ref >60)
GLUCOSE: 105 mg/dL — AB (ref 65–99)
POTASSIUM: 3.8 mmol/L (ref 3.5–5.1)
Sodium: 138 mmol/L (ref 135–145)

## 2016-10-28 LAB — CBC
HCT: 34.2 % — ABNORMAL LOW (ref 36.0–46.0)
Hemoglobin: 11.3 g/dL — ABNORMAL LOW (ref 12.0–15.0)
MCH: 31.1 pg (ref 26.0–34.0)
MCHC: 33 g/dL (ref 30.0–36.0)
MCV: 94.2 fL (ref 78.0–100.0)
Platelets: 300 10*3/uL (ref 150–400)
RBC: 3.63 MIL/uL — ABNORMAL LOW (ref 3.87–5.11)
RDW: 12.6 % (ref 11.5–15.5)
WBC: 6.4 10*3/uL (ref 4.0–10.5)

## 2016-10-28 LAB — I-STAT TROPONIN, ED: Troponin i, poc: 0 ng/mL (ref 0.00–0.08)

## 2016-10-28 NOTE — ED Provider Notes (Signed)
[facility] DEPT Provider Note   CSN: [id number] Arrival date & time: 10/28/16  2010  By signing my name below, I, Mayer Masker, attest that this documentation has been prepared under the direction and in the presence of [employer], PA-C. Electronically Signed: Mayer Masker, Scribe. 10/28/16. 10:33 PM.  History   Chief Complaint Chief Complaint  Patient presents with  . Chest Pain    The history is provided by the patient. No language interpreter was used.   HPI Comments: Brittany Sparks is a 51 y.o. female with a history of asthma, COPD, angina, HTN and GERD, who presents to the Emergency Department complaining of constant chest pain for two days that she describes as a squeezing. She states her pain increases when she walks. She has associated SOB, epigastric abdominal pain, nausea, and chronic cough. She states her pain is similar to a previous pain she has had with past episode of angina; at that time she was given NTG with relief. No NTG taken at home today.  She denies leg swelling/pain, vomiting, and fever. She has not seen her cardiologist recently. Pt also has a h/o SBO, hernia repair, and cholecystectomy. She is not a smoker and denies any allergies.  Past Medical History:  Diagnosis Date  . Asthma   . Constipation   . COPD (chronic obstructive pulmonary disease) ([place])   . Drug addiction ([place])   . GERD (gastroesophageal reflux disease)   . Hepatitis C   . Herpes simplex type 2 infection   . Hyperlipidemia   . Hypertension   . Recurrent mouth ulceration   . SBO (small bowel obstruction) ([place])    2014    Patient Active Problem List   Diagnosis Date Noted  . Morbid obesity ([place]) 06/17/2015  . Shortness of breath 02/07/2013  . Chest pain 02/07/2013  . HTN (hypertension) 02/22/2012  . COPD GOLD II 07/08/2011    Past Surgical History:  Procedure Laterality Date  . ABDOMINAL HYSTERECTOMY    . CHOLECYSTECTOMY      OB History    No data available       Home  Medications    Prior to Admission medications   Medication Sig Start Date End Date Taking? Authorizing Provider  amLODipine (NORVASC) 2.5 MG tablet Take 2.5 mg by mouth daily.   Yes Historical Provider, MD  amLODipine-olmesartan (AZOR) 5-20 MG per tablet Take 1 tablet by mouth every morning.   Yes Historical Provider, MD  budesonide-formoterol (SYMBICORT) 160-4.5 MCG/ACT inhaler INHALE 2 PUFFS INTO THE LUNGS TWICE DAILY 07/09/16  Yes Historical Provider, MD  Ibuprofen-Diphenhydramine Cit (ADVIL PM PO) Take 2 tablets by mouth at bedtime as needed (pain).    Yes Historical Provider, MD  LINZESS 290 MCG CAPS capsule TK ONE C PO QD 09/14/16  Yes Historical Provider, MD  olmesartan (BENICAR) 20 MG tablet Take 20 mg by mouth daily.  08/06/16  Yes Historical Provider, MD  simvastatin (ZOCOR) 10 MG tablet Take 10 mg by mouth at bedtime.   Yes Historical Provider, MD  valACYclovir (VALTREX) 1000 MG tablet Take 1,000 mg by mouth 2 (two) times daily.    Yes Historical Provider, MD  amoxicillin-clavulanate (AUGMENTIN) 875-125 MG tablet Take 1 tablet by mouth 2 (two) times daily. Patient not taking: Reported on 10/28/2016 06/11/15   Tanda Rockers, MD  methocarbamol (ROBAXIN) 500 MG tablet Take 2 tablets (1,000 mg total) by mouth 4 (four) times daily as needed for muscle spasms (muscle spasm/pain). Patient not taking: Reported on 10/28/2016 10/19/15  Francine Graven, DO  naproxen (NAPROSYN) 250 MG tablet Take 1 tablet (250 mg total) by mouth 2 (two) times daily as needed for mild pain or moderate pain (take with food). Patient not taking: Reported on 10/28/2016 10/19/15   Francine Graven, DO  ondansetron (ZOFRAN ODT) 4 MG disintegrating tablet Take 1 tablet (4 mg total) by mouth every 8 (eight) hours as needed for nausea. Patient not taking: Reported on 10/28/2016 04/08/15   Larene Pickett, PA-C  polyethylene glycol powder (GLYCOLAX/MIRALAX) powder Take 17 g by mouth 2 (two) times daily. Until daily soft  stools  OTC Patient not taking: Reported on 10/28/2016 04/08/15   Larene Pickett, PA-C  predniSONE (DELTASONE) 10 MG tablet Take  4 each am x 2 days,   2 each am x 2 days,  1 each am x 2 days and stop Patient not taking: Reported on 10/28/2016 06/11/15   Tanda Rockers, MD  senna-docusate (SENOKOT-S) 8.6-50 MG tablet Take 2 tablets by mouth daily. Patient not taking: Reported on 10/28/2016 04/10/15   Carmin Muskrat, MD  simethicone (GAS-X) 80 MG chewable tablet Chew 1 tablet (80 mg total) by mouth every 6 (six) hours as needed for flatulence. Patient not taking: Reported on 10/28/2016 04/08/15   Larene Pickett, PA-C  traMADol Veatrice Bourbon) 50 MG tablet 1-2 every 4 hours as needed for cough or pain Patient not taking: Reported on 10/28/2016 06/11/15   Tanda Rockers, MD    Family History Family History  Problem Relation Age of Onset  . Emphysema Father   . Heart disease Father   . Clotting disorder Father     Social History Social History  Substance Use Topics  . Smoking status: Former Smoker    Packs/day: 0.50    Years: 30.00    Types: Cigarettes    Quit date: 11/05/2006  . Smokeless tobacco: Never Used  . Alcohol use No     Allergies   Patient has no known allergies.   Review of Systems Review of Systems   All systems reviewed and are negative for acute change except as noted in the HPI.    Physical Exam Updated Vital Signs BP 130/90   Pulse (!) 53   Temp 98.1 F (36.7 C) (Oral)   Resp 18   Ht 5' (1.524 m)   Wt 154 lb (69.9 kg)   SpO2 99%   BMI 30.08 kg/m   Physical Exam  Constitutional: She is oriented to person, place, and time. She appears well-developed and well-nourished. No distress.  HENT:  Head: Normocephalic and atraumatic.  Mouth/Throat: Oropharynx is clear and moist.  Eyes: Conjunctivae are normal. Pupils are equal, round, and reactive to light.  Neck: Normal range of motion. Neck supple.  Cardiovascular: Normal rate, regular rhythm, normal heart sounds  and intact distal pulses.  Exam reveals no gallop and no friction rub.   No murmur heard. Pulmonary/Chest: Effort normal and breath sounds normal. No respiratory distress. She has no wheezes. She has no rales. She exhibits no tenderness.  Abdominal: Soft. Bowel sounds are normal. She exhibits no distension. There is tenderness (mild ).  Mild epigastric tenderness  Neurological: She is alert and oriented to person, place, and time. She exhibits normal muscle tone. Coordination normal.  Skin: Skin is warm and dry. Capillary refill takes less than 2 seconds. No rash noted. No erythema.  Psychiatric: She has a normal mood and affect. Her behavior is normal.  Nursing note and vitals reviewed.  ED Treatments / Results  DIAGNOSTIC STUDIES: Oxygen Saturation is 98% on RA, normal by my interpretation.    COORDINATION OF CARE: 10:35 PM Will order blood work. Discussed treatment plan with pt at bedside and pt agreed to plan.  Labs (all labs ordered are listed, but only abnormal results are displayed) Labs Reviewed  BASIC METABOLIC PANEL - Abnormal; Notable for the following:       Result Value   Glucose, Bld 105 (*)    All other components within normal limits  CBC - Abnormal; Notable for the following:    RBC 3.63 (*)    Hemoglobin 11.3 (*)    HCT 34.2 (*)    All other components within normal limits  I-STAT TROPOININ, ED    EKG  EKG Interpretation None       Radiology Dg Chest 2 View  Result Date: 10/28/2016 CLINICAL DATA:  Acute onset of generalized chest pain. Initial encounter. EXAM: CHEST  2 VIEW COMPARISON:  Chest radiograph performed 03/08/2015 FINDINGS: The lungs are well-aerated and clear. There is no evidence of focal opacification, pleural effusion or pneumothorax. The heart is normal in size; the mediastinal contour is within normal limits. No acute osseous abnormalities are seen. Postoperative change is noted at the left upper quadrant. IMPRESSION: No acute  cardiopulmonary process seen. Electronically Signed   By: Garald Balding M.D.   On: 10/28/2016 21:21    Procedures Procedures (including critical care time)  Medications Ordered in ED Medications - No data to display   Initial Impression / Assessment and Plan / ED Course  I have reviewed the triage vital signs and the nursing notes.  Pertinent labs & imaging results that were available during my care of the patient were reviewed by me and considered in my medical decision making (see chart for details).     Patient's chest discomfort seems atypical for cardiac chest pain based on the fact that is been constant with no relief or intermittent nature to the pain.  The patient will be referred back to her primary care Dr. told to return here as needed.  Patient agrees the plan and all questions were answered  Final Clinical Impressions(s) / ED Diagnoses   Final diagnoses:  None    New Prescriptions New Prescriptions   No medications on file  I personally performed the services described in this documentation, which was scribed in my presence. The recorded information has been reviewed and is accurate.    Dalia Heading, PA-C 10/29/16 [place], DO 10/29/16 1506

## 2016-10-29 DIAGNOSIS — R0789 Other chest pain: Secondary | ICD-10-CM | POA: Diagnosis not present

## 2016-10-29 LAB — I-STAT TROPONIN, ED: Troponin i, poc: 0 ng/mL (ref 0.00–0.08)

## 2016-10-29 MED ORDER — ACETAMINOPHEN 500 MG PO TABS
1000.0000 mg | ORAL_TABLET | Freq: Once | ORAL | Status: AC
  Administered 2016-10-29: 1000 mg via ORAL
  Filled 2016-10-29: qty 2

## 2016-10-29 NOTE — Discharge Instructions (Signed)
Your testing here tonight was normal. Follow up with your PCP for a recheck.

## 2016-11-29 ENCOUNTER — Emergency Department (HOSPITAL_COMMUNITY)
Admission: EM | Admit: 2016-11-29 | Discharge: 2016-11-29 | Disposition: A | Discharge status: Home / Self Care | Payer: Medicare Other | Attending: Emergency Medicine | Admitting: Emergency Medicine

## 2016-11-29 ENCOUNTER — Encounter (HOSPITAL_COMMUNITY): Payer: Self-pay

## 2016-11-29 ENCOUNTER — Emergency Department (HOSPITAL_COMMUNITY): Payer: Medicare Other

## 2016-11-29 DIAGNOSIS — J40 Bronchitis, not specified as acute or chronic: Secondary | ICD-10-CM

## 2016-11-29 DIAGNOSIS — J449 Chronic obstructive pulmonary disease, unspecified: Secondary | ICD-10-CM | POA: Diagnosis not present

## 2016-11-29 DIAGNOSIS — J45909 Unspecified asthma, uncomplicated: Secondary | ICD-10-CM | POA: Diagnosis not present

## 2016-11-29 DIAGNOSIS — R0602 Shortness of breath: Secondary | ICD-10-CM | POA: Diagnosis present

## 2016-11-29 DIAGNOSIS — J209 Acute bronchitis, unspecified: Secondary | ICD-10-CM | POA: Insufficient documentation

## 2016-11-29 LAB — I-STAT CHEM 8, ED
BUN: 11 mg/dL (ref 6–20)
CALCIUM ION: 1.18 mmol/L (ref 1.15–1.40)
CHLORIDE: 102 mmol/L (ref 101–111)
CREATININE: 0.9 mg/dL (ref 0.44–1.00)
GLUCOSE: 137 mg/dL — AB (ref 65–99)
HCT: 37 % (ref 36.0–46.0)
Hemoglobin: 12.6 g/dL (ref 12.0–15.0)
Potassium: 3.9 mmol/L (ref 3.5–5.1)
Sodium: 142 mmol/L (ref 135–145)
TCO2: 27 mmol/L (ref 0–100)

## 2016-11-29 LAB — I-STAT TROPONIN, ED: Troponin i, poc: 0 ng/mL (ref 0.00–0.08)

## 2016-11-29 LAB — CBC WITH DIFFERENTIAL/PLATELET
Basophils Absolute: 0 10*3/uL (ref 0.0–0.1)
Basophils Relative: 1 %
Eosinophils Absolute: 0 10*3/uL (ref 0.0–0.7)
Eosinophils Relative: 1 %
HEMATOCRIT: 37.1 % (ref 36.0–46.0)
HEMOGLOBIN: 11.9 g/dL — AB (ref 12.0–15.0)
LYMPHS ABS: 2.1 10*3/uL (ref 0.7–4.0)
LYMPHS PCT: 51 %
MCH: 30.6 pg (ref 26.0–34.0)
MCHC: 32.1 g/dL (ref 30.0–36.0)
MCV: 95.4 fL (ref 78.0–100.0)
MONOS PCT: 9 %
Monocytes Absolute: 0.4 10*3/uL (ref 0.1–1.0)
NEUTROS PCT: 40 %
Neutro Abs: 1.6 10*3/uL — ABNORMAL LOW (ref 1.7–7.7)
Platelets: 282 10*3/uL (ref 150–400)
RBC: 3.89 MIL/uL (ref 3.87–5.11)
RDW: 12.5 % (ref 11.5–15.5)
WBC: 4.1 10*3/uL (ref 4.0–10.5)

## 2016-11-29 LAB — D-DIMER, QUANTITATIVE: D-Dimer, Quant: <0.27 ug/mL-FEU (ref 0.00–0.50)

## 2016-11-29 MED ORDER — PREDNISONE 20 MG PO TABS
60.0000 mg | ORAL_TABLET | Freq: Once | ORAL | Status: AC
  Administered 2016-11-29: 60 mg via ORAL
  Filled 2016-11-29: qty 3

## 2016-11-29 MED ORDER — PREDNISONE 20 MG PO TABS: 40.0000 mg | ORAL_TABLET | Freq: Every day | ORAL | 0 refills | Status: DC

## 2016-11-29 MED ORDER — IPRATROPIUM-ALBUTEROL 0.5-2.5 (3) MG/3ML IN SOLN: 3.0000 mL | Freq: Once | RESPIRATORY_TRACT | Status: DC

## 2016-11-29 MED ORDER — ALBUTEROL SULFATE (2.5 MG/3ML) 0.083% IN NEBU
5.0000 mg | INHALATION_SOLUTION | Freq: Once | RESPIRATORY_TRACT | Status: AC
  Administered 2016-11-29: 5 mg via RESPIRATORY_TRACT
  Filled 2016-11-29: qty 6

## 2016-11-29 MED ORDER — AZITHROMYCIN 250 MG PO TABS: 250.0000 mg | ORAL_TABLET | Freq: Every day | ORAL | 0 refills | Status: DC

## 2016-11-29 MED ORDER — ACETAMINOPHEN 325 MG PO TABS
650.0000 mg | ORAL_TABLET | Freq: Once | ORAL | Status: AC
  Administered 2016-11-29: 650 mg via ORAL
  Filled 2016-11-29: qty 2

## 2016-11-29 NOTE — ED Provider Notes (Signed)
[facility] Provider Note   CSN: [id number] Arrival date & time: 11/29/16  0865     History   Chief Complaint Chief Complaint  Patient presents with  . Shortness of Breath  . Cough    HPI Brittany Sparks is a 51 y.o. female.  HPI Brittany Sparks is a 51 y.o. female with history of COPD, asthma, hypertension, hepatitis, presents to emergency department complaining of chest pain and shortness of breath. Patient reports she has been coughing increasingly more than usual. States she has had nasal congestion and productive cough over the last week. She reports subjective fever 3 days ago, but did not measure her temperature. She reports that today she developed pain in the center of the chest that she states feels like pressure. She states pain is worse when she takes deep breath or coughing. She reports associated shortness of breath. She tried taking over-the-counter cold and flu medication yesterday which did not help. She did not take her inhaler.  Past Medical History:  Diagnosis Date  . Asthma   . Constipation   . COPD (chronic obstructive pulmonary disease) ([place])   . Drug addiction ([place])   . GERD (gastroesophageal reflux disease)   . Hepatitis C   . Herpes simplex type 2 infection   . Hyperlipidemia   . Hypertension   . Recurrent mouth ulceration   . SBO (small bowel obstruction) ([place])    2014    Patient Active Problem List   Diagnosis Date Noted  . Morbid obesity ([place]) 06/17/2015  . Shortness of breath 02/07/2013  . Chest pain 02/07/2013  . HTN (hypertension) 02/22/2012  . COPD GOLD II 07/08/2011    Past Surgical History:  Procedure Laterality Date  . ABDOMINAL HYSTERECTOMY    . CHOLECYSTECTOMY      OB History    No data available       Home Medications    Prior to Admission medications   Medication Sig Start Date End Date Taking? Authorizing Provider  amLODipine (NORVASC) 2.5 MG tablet Take 2.5 mg by mouth daily.    [provider]    amLODipine-olmesartan (AZOR) 5-20 MG per tablet Take 1 tablet by mouth every morning.    [provider]  amoxicillin-clavulanate (AUGMENTIN) 875-125 MG tablet Take 1 tablet by mouth 2 (two) times daily. Patient not taking: Reported on 10/28/2016 06/11/15   Tanda Rockers, MD  budesonide-formoterol [facility]) 160-4.5 MCG/ACT inhaler INHALE 2 PUFFS INTO THE LUNGS TWICE DAILY 07/09/16   [provider]  Ibuprofen-Diphenhydramine Cit (ADVIL PM PO) Take 2 tablets by mouth at bedtime as needed (pain).     [provider]  LINZESS 290 MCG CAPS capsule TK ONE C PO QD 09/14/16   [provider]  methocarbamol (ROBAXIN) 500 MG tablet Take 2 tablets (1,000 mg total) by mouth 4 (four) times daily as needed for muscle spasms (muscle spasm/pain). Patient not taking: Reported on 10/28/2016 10/19/15   Francine Graven, DO  naproxen (NAPROSYN) 250 MG tablet Take 1 tablet (250 mg total) by mouth 2 (two) times daily as needed for mild pain or moderate pain (take with food). Patient not taking: Reported on 10/28/2016 10/19/15   Francine Graven, DO  olmesartan (BENICAR) 20 MG tablet Take 20 mg by mouth daily.  08/06/16   [provider]  ondansetron (ZOFRAN ODT) 4 MG disintegrating tablet Take 1 tablet (4 mg total) by mouth every 8 (eight) hours as needed for nausea. Patient not taking: Reported on 10/28/2016 04/08/15  Larene Pickett, PA-C  polyethylene glycol powder (GLYCOLAX/MIRALAX) powder Take 17 g by mouth 2 (two) times daily. Until daily soft stools  OTC Patient not taking: Reported on 10/28/2016 04/08/15   Larene Pickett, PA-C  predniSONE (DELTASONE) 10 MG tablet Take  4 each am x 2 days,   2 each am x 2 days,  1 each am x 2 days and stop Patient not taking: Reported on 10/28/2016 06/11/15   Tanda Rockers, MD  senna-docusate (SENOKOT-S) 8.6-50 MG tablet Take 2 tablets by mouth daily. Patient not taking: Reported on 10/28/2016 04/10/15   Carmin Muskrat, MD  simethicone  (GAS-X) 80 MG chewable tablet Chew 1 tablet (80 mg total) by mouth every 6 (six) hours as needed for flatulence. Patient not taking: Reported on 10/28/2016 04/08/15   Larene Pickett, PA-C  simvastatin (ZOCOR) 10 MG tablet Take 10 mg by mouth at bedtime.    [provider]  traMADol (ULTRAM) 50 MG tablet 1-2 every 4 hours as needed for cough or pain Patient not taking: Reported on 10/28/2016 06/11/15   Tanda Rockers, MD  valACYclovir (VALTREX) 1000 MG tablet Take 1,000 mg by mouth 2 (two) times daily.     [provider]    Family History Family History  Problem Relation Age of Onset  . Emphysema Father   . Heart disease Father   . Clotting disorder Father     Social History Social History  Substance Use Topics  . Smoking status: Former Smoker    Packs/day: 0.50    Years: 30.00    Types: Cigarettes    Quit date: 11/05/2006  . Smokeless tobacco: Never Used  . Alcohol use No     Allergies   Patient has no known allergies.   Review of Systems Review of Systems  Constitutional: Positive for chills and fever.  HENT: Positive for congestion.   Respiratory: Positive for cough, chest tightness and shortness of breath.   Cardiovascular: Positive for chest pain. Negative for palpitations and leg swelling.  Gastrointestinal: Negative for abdominal pain, diarrhea, nausea and vomiting.  Genitourinary: Negative for dysuria, flank pain and pelvic pain.  Musculoskeletal: Negative for arthralgias, myalgias, neck pain and neck stiffness.  Skin: Negative for rash.  Neurological: Negative for dizziness, weakness and headaches.  All other systems reviewed and are negative.    Physical Exam Updated Vital Signs BP 110/85 (BP Location: Right Arm)   Pulse 60   Temp 97.8 F (36.6 C) (Oral)   Resp 16   SpO2 99%   Physical Exam  Constitutional: She is oriented to person, place, and time. She appears well-developed and well-nourished. No distress.  HENT:  Head:  Normocephalic.  Eyes: Conjunctivae are normal.  Neck: Neck supple.  Cardiovascular: Normal rate, regular rhythm and normal heart sounds.   Pulmonary/Chest: Effort normal. She has no wheezes. She has no rales. She exhibits tenderness.  Decreased air movement bilaterally. Tenderness to palpation over lower sternum  Abdominal: Soft. Bowel sounds are normal. She exhibits no distension. There is no tenderness. There is no rebound.  Musculoskeletal: She exhibits no edema.  Neurological: She is alert and oriented to person, place, and time.  Skin: Skin is warm and dry.  Psychiatric: She has a normal mood and affect. Her behavior is normal.  Nursing note and vitals reviewed.    ED Treatments / Results  Labs (all labs ordered are listed, but only abnormal results are displayed) Labs Reviewed  CBC WITH DIFFERENTIAL/PLATELET - Abnormal; Notable  for the following:       Result Value   Hemoglobin 11.9 (*)    Neutro Abs 1.6 (*)    All other components within normal limits  I-STAT CHEM 8, ED - Abnormal; Notable for the following:    Glucose, Bld 137 (*)    All other components within normal limits  D-DIMER, QUANTITATIVE (NOT AT [facility])  I-STAT TROPOININ, ED    EKG  EKG Interpretation  Date/Time:  Saturday Nov 29 2016 06:15:06 EDT Ventricular Rate:  55 PR Interval:    QRS Duration: 90 QT Interval:  416 QTC Calculation: 398 R Axis:   62 Text Interpretation:  Normal sinus rhythm No significant change since last tracing Confirmed by Duffy Bruce [phone]) on 11/29/2016 7:06:45 AM       Radiology Dg Chest 2 View  Result Date: 11/29/2016 CLINICAL DATA:  Patient reports shortness of breath, productive cough, and pain upon inhalation. COPD, hypertension, former smoker. EXAM: CHEST  2 VIEW COMPARISON:  10/28/2016 FINDINGS: Normal heart size and pulmonary vascularity. No focal airspace disease or consolidation in the lungs. No blunting of costophrenic angles. No pneumothorax. Mediastinal  contours appear intact. Surgical clips in the left upper quadrant. IMPRESSION: No active cardiopulmonary disease. Electronically Signed   By: Lucienne Capers M.D.   On: 11/29/2016 06:15    Procedures Procedures (including critical care time)  Medications Ordered in ED Medications  acetaminophen (TYLENOL) tablet 650 mg (not administered)  ipratropium-albuterol (DUONEB) 0.5-2.5 (3) MG/3ML nebulizer solution 3 mL (not administered)  predniSONE (DELTASONE) tablet 60 mg (not administered)  albuterol (PROVENTIL) (2.5 MG/3ML) 0.083% nebulizer solution 5 mg (5 mg Nebulization Given 11/29/16 0644)     Initial Impression / Assessment and Plan / ED Course  I have reviewed the triage vital signs and the nursing notes.  Pertinent labs & imaging results that were available during my care of the patient were reviewed by me and considered in my medical decision making (see chart for details).     Pt in ED with chest pain, SOB, cough. Pt has hx of COPD and asthma, here with increased cough with productive sputum. He denies having pleuritic chest pain, will get a d-dimer, will get a troponin, basic labs, chest x-ray. Will try breathing treatment and prednisone disorder.   8:45 AM Patient feels better after breathing treatment. Chest x-ray is negative. Labs unremarkable. D-dimer and troponin both negative. Chest pain most likely musculoskeletal from coughing. Given productive sputum, worsening cough, will start on Z-Pak. Short course of prednisone. Instructed to use Combivent inhaler every 4 hours for the next 3 days. Follow-up with family doctor next week. Return precautions discussed.    Vitals:   11/29/16 0544  BP: 110/85  Pulse: 60  Resp: 16  Temp: 97.8 F (36.6 C)  TempSrc: Oral  SpO2: 99%     Final Clinical Impressions(s) / ED Diagnoses   Final diagnoses:  Bronchitis    New Prescriptions New Prescriptions   AZITHROMYCIN (ZITHROMAX) 250 MG TABLET    Take 1 tablet (250 mg total) by  mouth daily. Take first 2 tablets together, then 1 every day until finished.   PREDNISONE (DELTASONE) 20 MG TABLET    Take 2 tablets (40 mg total) by mouth daily.     Jeannett Senior, PA-C 11/29/16 0932    Duffy Bruce, MD 11/30/16 [place]

## 2016-11-29 NOTE — ED Triage Notes (Signed)
Pt reports sob, productive cough, and pain upon inhalation. Pt in no acute distress, A+OX4, speaking in complete sentences.

## 2016-11-29 NOTE — ED Notes (Signed)
Pt reports that she is feeling much better that we she arrived. Phlebotomy at the bedside. Pt is A&O and in NAD

## 2016-11-29 NOTE — Discharge Instructions (Signed)
Use your inhaler every 4 hrs. Take prednisone as prescribed until all gone for inflammation. Zithromax until all gone. Take tylenol or motrin for pain. Follow up with family doctor next week.

## 2018-02-24 ENCOUNTER — Encounter: Payer: Self-pay | Admitting: Internal Medicine

## 2018-02-24 ENCOUNTER — Ambulatory Visit (INDEPENDENT_AMBULATORY_CARE_PROVIDER_SITE_OTHER): Payer: Medicare Other | Admitting: Internal Medicine

## 2018-02-24 VITALS — BP 120/70 | HR 85 | Ht 60.0 in | Wt 143.2 lb

## 2018-02-24 DIAGNOSIS — J449 Chronic obstructive pulmonary disease, unspecified: Secondary | ICD-10-CM | POA: Diagnosis not present

## 2018-02-24 DIAGNOSIS — R05 Cough: Secondary | ICD-10-CM

## 2018-02-24 DIAGNOSIS — R058 Other specified cough: Secondary | ICD-10-CM

## 2018-02-24 MED ORDER — TRAMADOL HCL 50 MG PO TABS: 50.0000 mg | ORAL_TABLET | ORAL | 0 refills | Status: AC | PRN

## 2018-02-24 MED ORDER — FAMOTIDINE 20 MG PO TABS: ORAL_TABLET | ORAL | 11 refills | Status: AC

## 2018-02-24 MED ORDER — PREDNISONE 10 MG PO TABS: ORAL_TABLET | ORAL | 0 refills | Status: DC

## 2018-02-24 MED ORDER — PANTOPRAZOLE SODIUM 40 MG PO TBEC: 40.0000 mg | DELAYED_RELEASE_TABLET | Freq: Every day | ORAL | 2 refills | Status: DC

## 2018-02-24 NOTE — Progress Notes (Signed)
Subjective:    Patient ID: Brittany Sparks, female    DOB: 1965/10/31  MRN: [id number]     Brief patient profile:  51  yobf  quit smoking 2008 p dx of asthma by [facility] around 2000 referred 07/07/2011 to  Pulmonary clinic by Vicenta Aly with GOLD II COPD documented  07/29/12     History of Present Illness  07/07/2011 1st pulmonary cc cough x 12 years, daily no treatment for asthma has helped the cough including advair then abupt onset cough and sob much worse   x 3 weeks changed bp meds x 2 weeks ? Was this an ace inhibitor? - cough is dry more day than night but present 24 h per day, minimal sob, controls with saba but takes up to 10 puffs at a time before improves. rec Try aciphex  20mg   Take 30-60 min before first meal of the day and Pepcid 20 mg one bedtime  Symbicort 160 Take 2 puffs first thing in am and then another 2 puffs about 12 hours later.  Work on inhaler technique:  Only use your albuterol(ventolin, blue inhaler) as a rescue medication .  Best cough medication is delsym     08/29/2013 f/u ov/Brittany Sparks re: recurrent cough while on dexilant/ pepcid/ and dulera 200 2 bid no better Chief Complaint  Patient presents with  . Follow-up    Pt c/o increased cough for the past 7-8 months. Cough is occ prod with minimal light yellow sputum.  She also c/o occ SOB and wheezing.   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 Mostly sob when coughing, no better with saba  rec Prednisone 10 mg take  4 each am x 2 days,   2 each am x 2 days,  1 each am x 2 days and stop   Augmentin 875 mg take one pill twice daily  X 10 days - take at breakfast and supper with large glass of water.  It would help reduce the usual side effects (diarrhea and yeast infections) if you ate cultured yogurt at lunch.  First take delsym two tsp every 12 hours and supplement if needed with  tramadol 50 mg up to 2 every 4  hours   GERD   If not better in 2 weeks >  See [name] NP  > did not return   06/11/2015 acute extended ov/Brittany Sparks re: re-establish care re copd GOLD II  Chief Complaint  Patient presents with  . Follow-up    Pt c/o cough with green/brown mucus, wheeze and SOB. Pt's PCP put her on albuterol and Atrovent via nebulizer, but pt does not feel these are helping. Pt was also given a Zpak and oral pred but states these have not helped either.   was better p last rx,  Worse x 2-3 months despite symbicort and nebs but very shaky on details of care and not really sure what she has at home  rec Prednisone 10 mg take  4 each am x 2 days,   2 each am x 2 days,  1 each am x 2 days and stop   Augmentin 875 mg take one pill twice daily  X 10 days  First take mucinex dm 1200  every 12 hours and supplement if needed with  tramadol 50 mg up to 2 every 4 hours to suppress the urge to cough at all or even clear  your throat.  Continue dexilant before bfast and pepcid ac 20 mg at bedtime  GERD  Please schedule a follow up office visit in 2 weeks, sooner if needed - bring all medications / inhalers /neb solutions with you>  Did not return     02/24/2018 acute extended ov/Brittany Sparks re: re-establish/ copd II  cough x 6 months  Chief Complaint  Patient presents with  . Follow-up    COPD- last seen 2016.  C/o worsening sob, prod cough.    indolent onset progressively worse cough   Dry daytime > noct worse with exp to any irritants or extremes of heat  Better at hs  Worse on BREO / not much better on pred  Sleeps with head elevated  Doe = MMRC2 = can't walk a nl pace on a flat grade s sob but does fine slow and flat    No obvious day to day or daytime variability or assoc excess/ purulent sputum or mucus plugs or hemoptysis or cp or chest tightness, subjective wheeze or overt sinus or hb symptoms.     Also denies any obvious fluctuation of symptoms with weather or environmental changes or other aggravating or alleviating  factors except as outlined above   No unusual exposure hx or h/o childhood pna/ asthma or knowledge of premature birth.  Current Allergies, Complete Past Medical History, Past Surgical History, Family History, and Social History were reviewed in [employer] record.  ROS  The following are not active complaints unless bolded Hoarseness, sore throat, dysphagia, dental problems, itching, sneezing,  nasal congestion or discharge of excess mucus or purulent secretions, ear ache,   fever, chills, sweats, unintended wt loss or wt gain, classically pleuritic or exertional cp,  orthopnea pnd or arm/hand swelling  or leg swelling, presyncope, palpitations, abdominal pain, anorexia, nausea, vomiting, diarrhea  or change in bowel habits or change in bladder habits, change in stools or change in urine, dysuria, hematuria,  rash, arthralgias, visual complaints, headache, numbness, weakness or ataxia or problems with walking or coordination,  change in mood or  memory.        Current Meds  Medication Sig  . amLODipine-olmesartan (AZOR) 5-20 MG per tablet Take 1 tablet by mouth every morning.  . polyethylene glycol powder (GLYCOLAX/MIRALAX) powder Take 17 g by mouth 2 (two) times daily. Until daily soft stools  OTC  . senna-docusate (SENOKOT-S) 8.6-50 MG tablet Take 2 tablets by mouth daily.  . simvastatin (ZOCOR) 10 MG tablet Take 10 mg by mouth at bedtime.  . valACYclovir (VALTREX) 1000 MG tablet Take 1,000 mg by mouth 2 (two) times daily.   . [DISCONTINUED] fluticasone furoate-vilanterol (BREO ELLIPTA) 100-25 MCG/INH AEPB Inhale 1 puff into the lungs daily.  . [DISCONTINUED] predniSONE (DELTASONE) 10 MG tablet Take 10 mg by mouth daily with breakfast. Taper dose- pt unsure of taper                         Objective:   Physical Exam  amb bf with very harsh dry sounding coughing fits   Wt 149 07/07/2011 > 08/11/2011  153>>151 08/25/2011 >    146  04/26/2012 > 155 07/29/2012 >  06/11/2015 155> 02/24/2018  143    Vital signs reviewed - Note on arrival 02 sats  98% on RA      HEENT: nl dentition, turbinates bilaterally, and oropharynx. Nl external ear canals without cough reflex   NECK :  without JVD/Nodes/TM/ nl carotid  upstrokes bilaterally   LUNGS: no acc muscle use,  Nl contour chest with mod decresed bs  bilaterally without cough on insp or exp maneuvers   CV:  RRR  no s3 or murmur or increase in P2, and no edema   ABD:  soft and nontender with nl inspiratory excursion in the supine position. No bruits or organomegaly appreciated, bowel sounds nl  MS:  Nl gait/ ext warm without deformities, calf tenderness, cyanosis or clubbing No obvious joint restrictions   SKIN: warm and dry without lesions    NEURO:  alert, approp, nl sensorium with  no motor or cerebellar deficits apparent.          Dg Es 05/18/15 Pos GERD      I   reviewed  radiology impression as follows:  CXR:   02/22/18  wnl at [facility] per care everywhere report     Assessment & Plan:

## 2018-02-24 NOTE — Patient Instructions (Addendum)
Prednisone 10 mg take  4 each am x 2 days,   2 each am x 2 days,  1 each am x 2 days and stop    Stop symbicort and  BREO and Only use your albuterol(Ventolin) as a rescue medication to be used if you can't catch your breath by resting or doing a relaxed purse lip breathing pattern.  - The less you use it, the better it will work when you need it. - Ok to use up to 2 puffs  every 4 hours if you must but call for immediate appointment if use goes up over your usual need - Don't leave home without it !!  (think of it like the spare tire for your car)   The key to effective treatment for your cough is eliminating the non-stop cycle of cough you're stuck in long enough to let your airway heal completely and then see if there is anything still making you cough once you stop the cough suppression, but this should take no more than 5 days to figure out  First take mucinex dm 1200  every 12 hours and supplement if needed with  tramadol 50 mg up to 2 every 4 hours to suppress the urge to cough at all or even clear your throat. Swallowing water or using ice chips/non mint and menthol containing candies (such as lifesavers or sugarless jolly ranchers) are also effective.  You should rest your voice and avoid activities that you know make you cough.  Once you have eliminated the cough for 3 straight days try reducing the tramadol first,  then the mucinex dm  as tolerated.    Pantoprazole (protonix) 40 mg   Take  30-60 min before first meal of the day and Pepcid (famotidine)  20 mg one @  bedtime until return to office - this is the best way to tell whether stomach acid is contributing to your problem.     GERD (REFLUX)  is an extremely common cause of respiratory symptoms, many times with no significant heartburn at all.    It can be treated with medication, but also with lifestyle changes including avoidance of late meals, excessive alcohol, smoking cessation, and avoid fatty foods, chocolate, peppermint,  colas, red wine, and acidic juices such as orange juice.  NO MINT OR MENTHOL PRODUCTS SO NO COUGH DROPS   USE SUGARLESS CANDY INSTEAD (jolley ranchers or Stover's)  NO OIL BASED VITAMINS - use powdered substitutes.  No work x 2 weeks    Please schedule a follow up office visit in 2 weeks, sooner if needed - bring all medications / inhalers /neb solutions with you

## 2018-02-25 ENCOUNTER — Telehealth: Payer: Self-pay | Admitting: Internal Medicine

## 2018-02-25 ENCOUNTER — Encounter: Payer: Self-pay | Admitting: Internal Medicine

## 2018-02-25 NOTE — Telephone Encounter (Signed)
Called patient, unable to reach left message to give us a call back. 

## 2018-02-25 NOTE — Assessment & Plan Note (Addendum)
Recurrent from 08/2017 to 02/24/2018    Upper airway cough syndrome (previously labeled PNDS),  is so named because it's frequently impossible to sort out how much is  CR/sinusitis with freq throat clearing (which can be related to primary GERD)   vs  causing  secondary (" extra esophageal")  GERD from wide swings in gastric pressure that occur with throat clearing, often  promoting self use of mint and menthol lozenges that reduce the lower esophageal sphincter tone and exacerbate the problem further in a cyclical fashion.   These are the same pts (now being labeled as having "irritable larynx syndrome" by some cough centers) who not infrequently have a history of having failed to tolerate ace inhibitors(which may have been the case here as taking arb) ,  dry powder inhalers like BREO or biphosphonates or report having atypical/extraesophageal reflux symptoms(noting she has documented gerd)  that don't respond to standard doses of PPI  and are easily confused as having aecopd or asthma flares by even experienced allergists/ pulmonologists (myself included).   Of the three most common causes of  Sub-acute / recurrent or chronic cough, only one (GERD)  can actually contribute to/ trigger  the other two (asthma and post nasal drip syndrome)  and perpetuate the cylce of cough.  While not intuitively obvious, many patients with chronic low grade reflux do not cough until there is a primary insult that disturbs the protective epithelial barrier and exposes sensitive nerve endings.   This is typically viral but can due to PNDS and  either may apply here.     The point is that once this occurs, it is difficult to eliminate the cycle  using anything but a maximally effective acid suppression regimen at least in the short run, accompanied by an appropriate diet to address non acid GERD and control / eliminate the cough itself for at least 3 days with tramadol if needed.   Will return in 2 weeks to regroup with  all meds in hand using a trust but verify approach to confirm accurate Medication  Reconciliation The principal here is that until we are certain that the  patients are doing what we've asked, it makes no sense to ask them to do more.     I had an extended discussion with the patient reviewing all relevant studies completed to date and  lasting 25 minutes of a 40  minute acute office  visit to re-establish and address severe non-specific but potentially very serious refractory respiratory symptoms of uncertain and potentially multiple  etiologies.   Each maintenance medication was reviewed in detail including most importantly the difference between maintenance and prns and under what circumstances the prns are to be triggered using an action plan format that is not reflected in the computer generated alphabetically organized AVS.    Please see AVS for specific instructions unique to this office visit that I personally wrote and verbalized to the the pt in detail and then reviewed with pt  by my nurse highlighting any changes in therapy/plan of care  recommended at today's visit.

## 2018-02-25 NOTE — Assessment & Plan Note (Signed)
-   hfa improved to 75% 07/07/11 > 100% 08/11/2011    - PFTs 07/29/2012 FEV1 1.39 (61%)  Ratio 60 and no better p B2  DLCO 55%  > 114%   Cough not typical of copd at all, breo probably aggravating uacs so just use saba prn pending f/u in 2 weeks to regroup

## 2018-02-26 NOTE — Telephone Encounter (Signed)
Patient called needing instructions for Prednisone prescribed 02/25/18 by Dr. Melvyn Novas.   Instructions- Prednisone 10 mg take  4 each am x 2 days,   2 each am x 2 days,  1 each am x 2 days and stop        Instructions given, Patient read instructions back and stated understanding.  Nothing further at this time.

## 2018-02-26 NOTE — Telephone Encounter (Signed)
Patient states nurse was to call her back with directions; never received call; contact # [phone]

## 2018-03-12 ENCOUNTER — Ambulatory Visit (INDEPENDENT_AMBULATORY_CARE_PROVIDER_SITE_OTHER): Payer: Medicare Other | Admitting: Internal Medicine

## 2018-03-12 ENCOUNTER — Encounter: Payer: Self-pay | Admitting: Internal Medicine

## 2018-03-12 VITALS — BP 120/80 | HR 65 | Ht 60.0 in | Wt 140.0 lb

## 2018-03-12 DIAGNOSIS — R05 Cough: Secondary | ICD-10-CM | POA: Diagnosis not present

## 2018-03-12 DIAGNOSIS — J449 Chronic obstructive pulmonary disease, unspecified: Secondary | ICD-10-CM | POA: Diagnosis not present

## 2018-03-12 DIAGNOSIS — R058 Other specified cough: Secondary | ICD-10-CM

## 2018-03-12 LAB — NITRIC OXIDE: NITRIC OXIDE: 7

## 2018-03-12 MED ORDER — GABAPENTIN 100 MG PO CAPS: 100.0000 mg | ORAL_CAPSULE | Freq: Three times a day (TID) | ORAL | 2 refills | Status: DC

## 2018-03-12 MED ORDER — TRAMADOL HCL 50 MG PO TABS: 50.0000 mg | ORAL_TABLET | ORAL | 0 refills | Status: DC | PRN

## 2018-03-12 MED ORDER — AMLODIPINE-OLMESARTAN 5-20 MG PO TABS: 1.0000 | ORAL_TABLET | Freq: Every morning | ORAL | Status: DC

## 2018-03-12 MED ORDER — TIOTROPIUM BROMIDE-OLODATEROL 2.5-2.5 MCG/ACT IN AERS: 2.0000 | INHALATION_SPRAY | Freq: Every day | RESPIRATORY_TRACT | 0 refills | Status: DC

## 2018-03-12 MED ORDER — TIOTROPIUM BROMIDE-OLODATEROL 2.5-2.5 MCG/ACT IN AERS: 2.0000 | INHALATION_SPRAY | Freq: Every day | RESPIRATORY_TRACT | 11 refills | Status: DC

## 2018-03-12 NOTE — Patient Instructions (Addendum)
Only use your albuterol(Ventolin) as a rescue medication to be used if you can't catch your breath by resting or doing a relaxed purse lip breathing pattern.  - The less you use it, the better it will work when you need it. - Ok to use up to 2 puffs  every 4 hours if you must but call for immediate appointment if use goes up over your usual need - Don't leave home without it !!  (think of it like the spare tire for your car)   The key to effective treatment for your cough is eliminating the non-stop cycle of cough you're stuck in long enough to let your airway heal completely and then see if there is anything still making you cough once you stop the cough suppression, but this should take no more than 5 days to figure out  First take mucinex dm 1200  every 12 hours and supplement if needed with  tramadol 50 mg up to 2 every 4 hours to suppress the urge to cough at all or even clear your throat. Swallowing water or using ice chips/non mint and menthol containing candies (such as lifesavers or sugarless jolly ranchers) are also effective.  You should rest your voice and avoid activities that you know make you cough.  Once you have eliminated the cough for 3 straight days try reducing the tramadol first,  then the mucinex dm  as tolerated.    Pantoprazole (protonix) 40 mg   Take  30-60 min before first meal of the day and Pepcid (famotidine)  20 mg one @  bedtime until return to office - this is the best way to tell whether stomach acid is contributing to your problem.     GERD (REFLUX)  is an extremely common cause of respiratory symptoms, many times with no significant heartburn at all.    It can be treated with medication, but also with lifestyle changes including avoidance of late meals, excessive alcohol, smoking cessation, and avoid fatty foods, chocolate, peppermint, colas, red wine, and acidic juices such as orange juice.  NO MINT OR MENTHOL PRODUCTS SO NO COUGH DROPS   USE SUGARLESS CANDY  INSTEAD (jolley ranchers or Stover's)  NO OIL BASED VITAMINS - use powdered substitutes.   Start gabapentin 100 mg three times daily  Until return    For drainage / throat tickle try take CHLORPHENIRAMINE  4 mg - take one every 4 hours as needed - available over the counter- may cause drowsiness so start with just a bedtime dose or two and see how you tolerate it before trying in daytime     See [name] NP w/in 2 weeks with all your medications, even over the counter meds, separated in two separate bags, the ones you take no matter what vs the ones you stop once you feel better and take only as needed when you feel you need them.   [name]  will generate for you a new user friendly medication calendar that will put [place] all on the same page re: your medication use.     Without this process, it simply isn't possible to assure that we are providing  your outpatient care  with  the attention to detail we feel you deserve.   If we cannot assure that you're getting that kind of care,  then we cannot manage your problem effectively from this clinic.  Once you have seen [name] and we are sure that we're all on the same page with your medication  use she will arrange follow up with me.

## 2018-03-12 NOTE — Progress Notes (Signed)
Subjective:    Patient ID: Brittany Sparks, female    DOB: September 14, 1965  MRN: [id number]     Brief patient profile:  51  yobf  quit smoking 2008 p dx of asthma by [facility] around 2000 referred 07/07/2011 to  Pulmonary clinic by Vicenta Aly with GOLD II COPD documented  07/29/12     History of Present Illness  07/07/2011 1st pulmonary cc cough x 12 years, daily no treatment for asthma has helped the cough including advair then abupt onset cough and sob much worse   x 3 weeks changed bp meds x 2 weeks ? Was this an ace inhibitor? - cough is dry more day than night but present 24 h per day, minimal sob, controls with saba but takes up to 10 puffs at a time before improves. rec Try aciphex  20mg   Take 30-60 min before first meal of the day and Pepcid 20 mg one bedtime  Symbicort 160 Take 2 puffs first thing in am and then another 2 puffs about 12 hours later.  Work on inhaler technique:  Only use your albuterol(ventolin, blue inhaler) as a rescue medication .  Best cough medication is delsym     08/29/2013 f/u ov/Brittany Sparks re: recurrent cough while on dexilant/ pepcid/ and dulera 200 2 bid no better Chief Complaint  Patient presents with  . Follow-up    Pt c/o increased cough for the past 7-8 months. Cough is occ prod with minimal light yellow sputum.  She also c/o occ SOB and wheezing.   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 Mostly sob when coughing, no better with saba  rec Prednisone 10 mg take  4 each am x 2 days,   2 each am x 2 days,  1 each am x 2 days and stop   Augmentin 875 mg take one pill twice daily  X 10 days - take at breakfast and supper with large glass of water.  It would help reduce the usual side effects (diarrhea and yeast infections) if you ate cultured yogurt at lunch.  First take delsym two tsp every 12 hours and supplement if needed with  tramadol 50 mg up to 2 every 4  hours   GERD   If not better in 2 weeks >  See Brittany Sparks  > did not return as req      02/24/2018 acute extended ov/Brittany Sparks re: re-establish/ copd II  cough x 6 months  Chief Complaint  Patient presents with  . Follow-up    COPD- last seen 2016.  C/o worsening sob, prod cough.    indolent onset progressively worse cough   Dry daytime > noct worse with exp to any irritants or extremes of heat  Better at hs  Worse on BREO / not much better on pred  Sleeps with head elevated  Doe = MMRC2 = can't walk a nl pace on a flat grade s sob but does fine slow and flat  rec Prednisone 10 mg take  4 each am x 2 days,   2 each am x 2 days,  1 each am x 2 days and stop   Stop symbicort and  BREO and Only use your albuterol(Ventolin) as a rescue medication to be used if you can't catch your breath   First take mucinex dm 1200  every 12 hours and supplement if needed with  tramadol  50 mg up to 2 every 4 hours to suppress the urge to cough at all or even clear your throat. Swallowing water or using ice chips/non mint and menthol containing candies (such as lifesavers or sugarless jolly ranchers) are also effective.  You should rest your voice and avoid activities that you know make you cough. Once you have eliminated the cough for 3 straight days try reducing the tramadol first,  then the mucinex dm  as tolerated.   Pantoprazole (protonix) 40 mg   Take  30-60 min before first meal of the day and Pepcid (famotidine)  20 mg one @  bedtime until return   No work x 2 weeks  Please schedule a follow up office visit in 2 weeks, sooner if needed - bring all medications / inhalers /neb solutions with you     03/12/2018  f/u ov/Brittany Sparks re: cough since winter 2019  Chief Complaint  Patient presents with  . Follow-up    Cough went away right after last visit and then started back about 2 wks ago. She is using her albuterol inhaler 6 x daily on average. Her cough is prod at times with clear sputum.   cough ? Only better  while on suppression, day>>> noct, min productive with mucoid sputum No change in saba need on symb vs off [place] non variable of changed by stopping symb = MMRC2 = can't walk a nl pace on a flat grade s sob but does fine slow and flat eg    No obvious day to day or daytime variability or assoc turly excess/ purulent sputum or mucus plugs or hemoptysis or cp or chest tightness, subjective wheeze or overt sinus or hb symptoms.   Sleeps with head elevated on 2 pillows  without nocturnal  or early am exacerbation  of respiratory  c/o's or need for noct saba. Also denies any obvious fluctuation of symptoms with weather or environmental changes or other aggravating or alleviating factors except as outlined above   No unusual exposure hx or h/o childhood pna/ asthma or knowledge of premature birth.  Current Allergies, Complete Past Medical History, Past Surgical History, Family History, and Social History were reviewed in [employer] record.  ROS  The following are not active complaints unless bolded Hoarseness, sore throat, dysphagia, dental problems, itching, sneezing,  nasal congestion or discharge of excess mucus or purulent secretions, ear ache,   fever, chills, sweats, unintended wt loss or wt gain, classically pleuritic or exertional cp,  orthopnea pnd or arm/hand swelling  or leg swelling, presyncope, palpitations, abdominal pain, anorexia, nausea, vomiting, diarrhea  or change in bowel habits or change in bladder habits, change in stools or change in urine, dysuria, hematuria,  rash, arthralgias, visual complaints, headache, numbness, weakness or ataxia or problems with walking or coordination,  change in mood or  memory.        Current Meds  Medication Sig  . amLODipine (NORVASC) 5 MG tablet Take 5 mg by mouth daily.  . famotidine (PEPCID) 20 MG tablet One at bedtime  . naproxen sodium (ALEVE) 220 MG tablet Take 220 mg by mouth daily as needed.  . pantoprazole (PROTONIX)  40 MG tablet Take 1 tablet (40 mg total) by mouth daily. Take 30-60 min before first meal of the day  . simvastatin (ZOCOR) 10 MG tablet Take 10 mg by mouth at bedtime.  . traMADol (ULTRAM) 50 MG tablet Take 1 tablet (50 mg total) by mouth every 4 (four) hours as  needed.  .     .                              Objective:   Physical Exam     Wt 149 07/07/2011 > 08/11/2011  153>>151 08/25/2011 >    146  04/26/2012 > 155 07/29/2012 > 06/11/2015 155> 02/24/2018  143 > 03/12/2018  140   amb bf harsh dry upper airway cough    HEENT: nl dentition, turbinates bilaterally, and oropharynx. Nl external ear canals without cough reflex   NECK :  without JVD/Nodes/TM/ nl carotid upstrokes bilaterally   LUNGS: no acc muscle use,  Nl contour chest which is clear to A and P bilaterally without cough on insp or exp maneuvers   CV:  RRR  no s3 or murmur or increase in P2, and no edema   ABD: obese/  soft and nontender with nl inspiratory excursion in the supine position. No bruits or organomegaly appreciated, bowel sounds nl  MS:  Nl gait/ ext warm without deformities, calf tenderness, cyanosis or clubbing No obvious joint restrictions   SKIN: warm and dry without lesions    NEURO:  alert, approp, nl sensorium with  no motor or cerebellar deficits apparent.                  Dg Es 05/18/15 Pos GERD          Assessment & Plan:

## 2018-03-14 ENCOUNTER — Encounter: Payer: Self-pay | Admitting: Internal Medicine

## 2018-03-14 NOTE — Assessment & Plan Note (Addendum)
Dg Es 05/18/15  Pos GERD  Recurrent from 08/2017 to 03/12/2018  - FENO 03/12/2018  =   7  - trial of gabapentin 100 tid 03/12/2018    Of the three most common causes of  Sub-acute / recurrent or chronic cough, only one (GERD)  can actually contribute to/ trigger  the other two (asthma and post nasal drip syndrome)  and perpetuate the cylce of cough.  While not intuitively obvious, many patients with chronic low grade reflux (which in her case was documented by Esophogram 05/18/15)  do not cough until there is a primary insult that disturbs the protective epithelial barrier and exposes sensitive nerve endings.   This is typically viral but can due to PNDS and  either may apply here.   The point is that once this occurs, it is difficult to eliminate the cycle  using anything but a maximally effective acid suppression regimen at least in the short run, accompanied by an appropriate diet to address non acid GERD and control / eliminate the cough itself for at least 3 days with tramadol and try to get longterm elimination of cyclical cough with gabapentin starting with 100 mg tid and building to 300 mg tid max if needed and if not successful repeat GI w/u for gerd.   I had an extended discussion with the patient reviewing all relevant studies completed to date and  lasting 15 to 20 minutes of a 25 minute visit    See device teaching which extended face to face time for this visit.  Each maintenance medication was reviewed in detail including emphasizing most importantly the difference between maintenance and prns and under what circumstances the prns are to be triggered using an action plan format that is not reflected in the computer generated alphabetically organized AVS which I have not found useful in most complex patients, especially with respiratory illnesses  Please see AVS for specific instructions unique to this visit that I personally wrote and verbalized to the the pt in detail and then reviewed with  pt  by my nurse highlighting any  changes in therapy recommended at today's visit to their plan of care.

## 2018-03-14 NOTE — Assessment & Plan Note (Addendum)
-   PFTs 07/29/2012 FEV1 1.39 (61%)  Ratio 60 and no better p B2  DLCO 55%  > 114% - Spirometry 03/12/2018  FEV1 1.1 (56%)  Ratio 61 classic curvature, nothing prior    Pt is Group B in terms of symptom/risk and laba/lama therefore appropriate rx at this point and the problem is that she also has classic uacs so super senisitive to inhaled agents best options: lama/laba neb vs stiolto so rec sample of stiolto and regroup in 2 weeks with all meds in hand using a trust but verify approach to confirm accurate Medication  Reconciliation The principal here is that until we are certain that the  patients are doing what we've asked, it makes no sense to ask them to do more.    03/12/2018  After extensive coaching inhaler device,  effectiveness =    75% with SMI from a baseline of 25%

## 2018-03-22 ENCOUNTER — Encounter: Payer: Self-pay | Admitting: Emergency Medicine

## 2018-03-22 ENCOUNTER — Emergency Department
Admission: EM | Admit: 2018-03-22 | Discharge: 2018-03-22 | Disposition: A | Discharge status: Home / Self Care | Payer: Medicare Other | Attending: Emergency Medicine | Admitting: Emergency Medicine

## 2018-03-22 ENCOUNTER — Emergency Department: Payer: Medicare Other

## 2018-03-22 ENCOUNTER — Other Ambulatory Visit: Payer: Self-pay

## 2018-03-22 DIAGNOSIS — W010XXA Fall on same level from slipping, tripping and stumbling without subsequent striking against object, initial encounter: Secondary | ICD-10-CM | POA: Diagnosis not present

## 2018-03-22 DIAGNOSIS — M25561 Pain in right knee: Secondary | ICD-10-CM | POA: Insufficient documentation

## 2018-03-22 DIAGNOSIS — M25551 Pain in right hip: Secondary | ICD-10-CM | POA: Diagnosis not present

## 2018-03-22 DIAGNOSIS — W19XXXA Unspecified fall, initial encounter: Secondary | ICD-10-CM

## 2018-03-22 DIAGNOSIS — Y9259 Other trade areas as the place of occurrence of the external cause: Secondary | ICD-10-CM | POA: Diagnosis not present

## 2018-03-22 DIAGNOSIS — Y9389 Activity, other specified: Secondary | ICD-10-CM | POA: Diagnosis not present

## 2018-03-22 DIAGNOSIS — Z79899 Other long term (current) drug therapy: Secondary | ICD-10-CM | POA: Insufficient documentation

## 2018-03-22 DIAGNOSIS — I10 Essential (primary) hypertension: Secondary | ICD-10-CM | POA: Insufficient documentation

## 2018-03-22 DIAGNOSIS — Z87891 Personal history of nicotine dependence: Secondary | ICD-10-CM | POA: Insufficient documentation

## 2018-03-22 DIAGNOSIS — J449 Chronic obstructive pulmonary disease, unspecified: Secondary | ICD-10-CM | POA: Insufficient documentation

## 2018-03-22 MED ORDER — IBUPROFEN 600 MG PO TABS
600.0000 mg | ORAL_TABLET | Freq: Once | ORAL | Status: AC
  Administered 2018-03-22: 600 mg via ORAL
  Filled 2018-03-22: qty 1

## 2018-03-22 MED ORDER — IBUPROFEN 600 MG PO TABS: 600.0000 mg | ORAL_TABLET | Freq: Three times a day (TID) | ORAL | 0 refills | Status: DC | PRN

## 2018-03-22 MED ORDER — HYDROCODONE-ACETAMINOPHEN 5-325 MG PO TABS: 1.0000 | ORAL_TABLET | Freq: Four times a day (QID) | ORAL | 0 refills | Status: DC | PRN

## 2018-03-22 MED ORDER — HYDROCODONE-ACETAMINOPHEN 5-325 MG PO TABS
1.0000 | ORAL_TABLET | Freq: Once | ORAL | Status: AC
  Administered 2018-03-22: 1 via ORAL
  Filled 2018-03-22: qty 1

## 2018-03-22 NOTE — ED Triage Notes (Signed)
First Nurse: patient brought in by ems. Patient slipped and fell at [place]. Patient with complaint of right knee and hip pain.

## 2018-03-22 NOTE — ED Provider Notes (Signed)
[facility] Emergency Department Provider Note   ____________________________________________   First MD Initiated Contact with Patient 03/22/18 0430     (approximate)  I have reviewed the triage vital signs and the nursing notes.   HISTORY  Chief Complaint Fall    HPI Brittany Sparks is a 52 y.o. female brought to the ED from [facility] via EMS status post mechanical fall.  Patient reports she was shopping when she slipped on something wet on the floor and fell onto her right side.  Denies striking head or LOC.  Complains of right hip and right knee pain.  Ambulatory with steady gait.  Denies headache, vision changes, neck pain, chest pain, shortness of breath, abdominal pain, hematuria, nausea or vomiting.  Denies anticoagulant use.   Past Medical History:  Diagnosis Date  . Asthma   . Constipation   . COPD (chronic obstructive pulmonary disease) ([place])   . Drug addiction ([place])   . GERD (gastroesophageal reflux disease)   . Hepatitis C   . Herpes simplex type 2 infection   . Hyperlipidemia   . Hypertension   . Recurrent mouth ulceration   . SBO (small bowel obstruction) ([place])    2014    Patient Active Problem List   Diagnosis Date Noted  . Morbid obesity ([place]) 06/17/2015  . Shortness of breath 02/07/2013  . Chest pain 02/07/2013  . Upper airway cough syndrome 04/26/2012  . HTN (hypertension) 02/22/2012  . COPD GOLD II 07/08/2011    Past Surgical History:  Procedure Laterality Date  . ABDOMINAL HYSTERECTOMY    . CHOLECYSTECTOMY      Prior to Admission medications   Medication Sig Start Date End Date Taking? Authorizing Provider  amLODipine (NORVASC) 5 MG tablet Take 5 mg by mouth daily.   Yes [provider]  famotidine (PEPCID) 20 MG tablet One at bedtime 02/24/18  Yes Tanda Rockers, MD  gabapentin (NEURONTIN) 100 MG capsule Take 1 capsule (100 mg total) by mouth 3 (three) times daily. One three times daily 03/12/18  Yes Tanda Rockers, MD  olmesartan (BENICAR) 20 MG tablet Take 20 mg by mouth daily.   Yes [provider]  pantoprazole (PROTONIX) 40 MG tablet Take 1 tablet (40 mg total) by mouth daily. Take 30-60 min before first meal of the day 02/24/18  Yes Tanda Rockers, MD  simvastatin (ZOCOR) 10 MG tablet Take 10 mg by mouth at bedtime.   Yes [provider]  Tiotropium Bromide-Olodaterol (STIOLTO RESPIMAT) 2.5-2.5 MCG/ACT AERS Inhale 2 puffs into the lungs daily. 03/12/18  Yes Tanda Rockers, MD  traMADol (ULTRAM) 50 MG tablet Take 1 tablet (50 mg total) by mouth every 4 (four) hours as needed. 03/12/18  Yes Tanda Rockers, MD  valACYclovir (VALTREX) 1000 MG tablet Take 1,000 mg by mouth 2 (two) times daily.   Yes [provider]    Allergies Patient has no known allergies.  Family History  Problem Relation Age of Onset  . Emphysema Father   . Heart disease Father   . Clotting disorder Father     Social History Social History   Tobacco Use  . Smoking status: Former Smoker    Packs/day: 0.50    Years: 30.00    Pack years: 15.00    Types: Cigarettes    Last attempt to quit: 11/05/2006    Years since quitting: 11.3  . Smokeless tobacco: Never Used  Substance Use Topics  . Alcohol use: No  .  Drug use: No    Review of Systems  Constitutional: No fever/chills Eyes: No visual changes. ENT: No sore throat. Cardiovascular: Denies chest pain. Respiratory: Denies shortness of breath. Gastrointestinal: No abdominal pain.  No nausea, no vomiting.  No diarrhea.  No constipation. Genitourinary: Negative for dysuria. Musculoskeletal: Positive for right hip and knee pain.  Negative for back pain. Skin: Negative for rash. Neurological: Negative for headaches, focal weakness or numbness.   ____________________________________________   PHYSICAL EXAM:  VITAL SIGNS: ED Triage Vitals [03/22/18 0327]  Enc Vitals Group     BP (!) 146/98     Pulse Rate (!) 56     Resp 16      Temp 98.2 F (36.8 C)     Temp Source Oral     SpO2 97 %     Weight 140 lb (63.5 kg)     Height 5' (1.524 m)     Head Circumference      Peak Flow      Pain Score 4     Pain Loc      Pain Edu?      Excl. in [place]?     Constitutional: Alert and oriented. Well appearing and in no acute distress. Eyes: Conjunctivae are normal. PERRL. EOMI. Head: Atraumatic. Nose: No external evidence of injury. Mouth/Throat: Mucous membranes are moist.  No dental malocclusion. Neck: No stridor.  No cervical spine tenderness to palpation. Cardiovascular: Normal rate, regular rhythm. Grossly normal heart sounds.  Good peripheral circulation. Respiratory: Normal respiratory effort.  No retractions. Lungs CTAB. Gastrointestinal: Soft and nontender. No distention. No abdominal bruits. No CVA tenderness. Musculoskeletal: No spinal tenderness to palpation.  Pelvis stable.  Mild tenderness to palpation right hip and right lateral knee.  Limited range of motion secondary to pain.  2+ distal pulses.  Brisk, less than 5-second capillary refill.  No pedal edema.  No joint effusions. Neurologic:  Normal speech and language. No gross focal neurologic deficits are appreciated.  Skin:  Skin is warm, dry and intact. No rash noted. Psychiatric: Mood and affect are normal. Speech and behavior are normal.  ____________________________________________   LABS (all labs ordered are listed, but only abnormal results are displayed)  Labs Reviewed - No data to display ____________________________________________  EKG  None ____________________________________________  RADIOLOGY  ED MD interpretation: No acute fracture or dislocation  Official radiology report(s): Dg Knee Complete 4 Views Right  Result Date: 03/22/2018 CLINICAL DATA:  Fall with pain EXAM: RIGHT KNEE - COMPLETE 4+ VIEW COMPARISON:  None. FINDINGS: No evidence of fracture, dislocation, or joint effusion. No evidence of arthropathy or other focal bone  abnormality. Soft tissues are unremarkable. IMPRESSION: Negative. Electronically Signed   By: Donavan Foil M.D.   On: 03/22/2018 03:57   Dg Hip Unilat  With Pelvis 2-3 Views Right  Result Date: 03/22/2018 CLINICAL DATA:  Slipped on floor, pain in the right hip EXAM: DG HIP (WITH OR WITHOUT PELVIS) 2-3V RIGHT COMPARISON:  None. FINDINGS: SI joints are non widened. The pubic symphysis and rami appear intact. No acute displaced fracture or malalignment. Multiple calcified phleboliths in the pelvis. IMPRESSION: No acute osseous abnormality. Electronically Signed   By: Donavan Foil M.D.   On: 03/22/2018 03:56    ____________________________________________   PROCEDURES  Procedure(s) performed: None  Procedures  Critical Care performed: No  ____________________________________________   INITIAL IMPRESSION / ASSESSMENT AND PLAN / ED COURSE  As part of my medical decision making, I reviewed the following data within the  electronic MEDICAL RECORD NUMBER Nursing notes reviewed and incorporated, Radiograph reviewed and Notes from prior ED visits   52 year old female who presents status post mechanical fall with right hip and knee pain.  X-rays negative for acute traumatic injuries.  Will Ace wrap right knee, place on Motrin and Norco as needed for pain, patient will follow-up with orthopedics as needed.  Strict return precautions given.  Patient verbalizes understanding and agrees with plan of care.      ____________________________________________   FINAL CLINICAL IMPRESSION(S) / ED DIAGNOSES  Final diagnoses:  Fall, initial encounter  Acute pain of right knee  Right hip pain     ED Discharge Orders    None       Note:  This document was prepared using Dragon voice recognition software and may include unintentional dictation errors.    Paulette Blanch, MD 03/22/18 [phone]

## 2018-03-22 NOTE — ED Notes (Signed)
Patient transported to X-ray 

## 2018-03-22 NOTE — ED Triage Notes (Signed)
Pt says she was shopping at [facility] just prior to arrival when she slipped on "some shampoo or something" on the floor; fell onto her right side; c/o right hip and right knee pain; asked pt if she was able to walk and she responded "I think so"; pt denies any other injuries

## 2018-03-22 NOTE — Discharge Instructions (Addendum)
1.  You may take pain medicines as needed (Motrin/Norco #15). 2.  You may remove Ace wrap to bathe and sleep. 3.  Elevate affected area and apply ice several times daily. 4.  Use crutches as needed to walk. 5.  Return to the ER for worsening symptoms, persistent vomiting, difficulty breathing or other concerns.

## 2018-03-26 ENCOUNTER — Encounter: Payer: Self-pay | Admitting: Adult Health

## 2018-03-26 ENCOUNTER — Ambulatory Visit (INDEPENDENT_AMBULATORY_CARE_PROVIDER_SITE_OTHER): Payer: Medicare Other | Admitting: Adult Health

## 2018-03-26 DIAGNOSIS — R058 Other specified cough: Secondary | ICD-10-CM

## 2018-03-26 DIAGNOSIS — J449 Chronic obstructive pulmonary disease, unspecified: Secondary | ICD-10-CM | POA: Diagnosis not present

## 2018-03-26 DIAGNOSIS — R05 Cough: Secondary | ICD-10-CM

## 2018-03-26 NOTE — Patient Instructions (Signed)
Follow med calendar closely and bring to each visit.  Follow up with Dr. Melvyn Novas  In 6 weeks and As needed

## 2018-03-26 NOTE — Progress Notes (Signed)
@Patient  ID: Brittany Sparks, female    DOB: Jan 23, 1966, 52 y.o.   MRN: [id number]  Chief Complaint  Patient presents with  . Follow-up    Referring provider: Vicenta Aly, FNP  HPI: 52 year old female former smoker, quit 2008, followed for gold 2 COPD and chronic cough    03/26/2018 Follow up : Cough and Med Review  Patient presents for a 2-week follow-up.  Patient has been followed for chronic cough.  Last visit she was started on gabapentin 100 mg 3 times daily.  Along with cough suppression regimen and GERD treatment.  Patient says she is so much better.  Cough is 95% improved.  She says this is the best she has been in years.  She denies any fever chest pain orthopnea PND or increased leg swelling  We reviewed all her medications organize them into a medication encounter with patient education.  It appears she is taking her medications correctly Cough is 95% better.  Feels much better   No Known Allergies  Immunization History  Administered Date(s) Administered  . Influenza Whole 06/23/2011, 04/12/2012  . Influenza,trivalent, recombinat, inj, PF 05/14/2011, 04/01/2012  . Pneumococcal Polysaccharide-23 04/07/2011  . Pneumococcal-Unspecified 05/14/2011  . Tdap 05/14/2011    Past Medical History:  Diagnosis Date  . Asthma   . Constipation   . COPD (chronic obstructive pulmonary disease) ([place])   . Drug addiction ([place])   . GERD (gastroesophageal reflux disease)   . Hepatitis C   . Herpes simplex type 2 infection   . Hyperlipidemia   . Hypertension   . Recurrent mouth ulceration   . SBO (small bowel obstruction) ([place])    2014    Tobacco History: Social History   Tobacco Use  Smoking Status Former Smoker  . Packs/day: 0.50  . Years: 30.00  . Pack years: 15.00  . Types: Cigarettes  . Last attempt to quit: 11/05/2006  . Years since quitting: 11.3  Smokeless Tobacco Never Used   Counseling given: Not Answered   Outpatient Medications Prior to Visit    Medication Sig Dispense Refill  . amLODipine (NORVASC) 5 MG tablet Take 5 mg by mouth daily.    . famotidine (PEPCID) 20 MG tablet One at bedtime 30 tablet 11  . gabapentin (NEURONTIN) 100 MG capsule Take 1 capsule (100 mg total) by mouth 3 (three) times daily. One three times daily 90 capsule 2  . HYDROcodone-acetaminophen (NORCO) 5-325 MG tablet Take 1 tablet by mouth every 6 (six) hours as needed for moderate pain. 15 tablet 0  . ibuprofen (ADVIL,MOTRIN) 600 MG tablet Take 1 tablet (600 mg total) by mouth every 8 (eight) hours as needed. 15 tablet 0  . olmesartan (BENICAR) 20 MG tablet Take 20 mg by mouth daily.    . pantoprazole (PROTONIX) 40 MG tablet Take 1 tablet (40 mg total) by mouth daily. Take 30-60 min before first meal of the day 30 tablet 2  . simvastatin (ZOCOR) 10 MG tablet Take 10 mg by mouth at bedtime.    . Tiotropium Bromide-Olodaterol (STIOLTO RESPIMAT) 2.5-2.5 MCG/ACT AERS Inhale 2 puffs into the lungs daily. 1 Inhaler 11  . traMADol (ULTRAM) 50 MG tablet Take 1 tablet (50 mg total) by mouth every 4 (four) hours as needed. 40 tablet 0  . valACYclovir (VALTREX) 1000 MG tablet Take 1,000 mg by mouth 2 (two) times daily.     No facility-administered medications prior to visit.      Review of Systems  Constitutional:   No  weight loss, night sweats,  Fevers, chills, fatigue, or  lassitude.  HEENT:   No headaches,  Difficulty swallowing,  Tooth/dental problems, or  Sore throat,                No sneezing, itching, ear ache, nasal congestion, post nasal drip,   CV:  No chest pain,  Orthopnea, PND, swelling in lower extremities, anasarca, dizziness, palpitations, syncope.   GI  No heartburn, indigestion, abdominal pain, nausea, vomiting, diarrhea, change in bowel habits, loss of appetite, bloody stools.   Resp: .  No chest wall deformity  Skin: no rash or lesions.  GU: no dysuria, change in color of urine, no urgency or frequency.  No flank pain, no hematuria   MS:   No joint pain or swelling.  No decreased range of motion.  No back pain.    Physical Exam  BP 118/82 (BP Location: Left Arm, Cuff Size: Normal)   Pulse 65   Ht 5' (1.524 m)   SpO2 96%   BMI 27.34 kg/m   GEN: A/Ox3; pleasant , NAD, well nourished    HEENT:  [place]/AT,  EACs-clear, TMs-wnl, NOSE-clear, THROAT-clear, no lesions, no postnasal drip or exudate noted.   NECK:  Supple w/ fair ROM; no JVD; normal carotid impulses w/o bruits; no thyromegaly or nodules palpated; no lymphadenopathy.    RESP  Clear  P & A; w/o, wheezes/ rales/ or rhonchi. no accessory muscle use, no dullness to percussion  CARD:  RRR, no m/r/g, no peripheral edema, pulses intact, no cyanosis or clubbing.  GI:   Soft & nt; nml bowel sounds; no organomegaly or masses detected.   Musco: Warm bil, no deformities or joint swelling noted.   Neuro: alert, no focal deficits noted.    Skin: Warm, no lesions or rashes    Lab Results:  CBC  BNP No results found for: BNP  Imaging:    Assessment & Plan:   No problem-specific Assessment & Plan notes found for this encounter.     Rexene Edison, NP 03/26/2018

## 2018-03-29 ENCOUNTER — Encounter: Payer: Self-pay | Admitting: Adult Health

## 2018-03-29 NOTE — Assessment & Plan Note (Signed)
Stable on Stiolto .

## 2018-03-29 NOTE — Assessment & Plan Note (Signed)
Much improved on current regimen with Gabapentin  Patient's medications were reviewed today and patient education was given. Computerized medication calendar was adjusted/completed   Plan  Patient Instructions  Follow med calendar closely and bring to each visit.  Follow up with Dr. Melvyn Novas  In 6 weeks and As needed    n

## 2018-04-01 NOTE — Progress Notes (Signed)
Chart and office note reviewed in detail  > agree with a/p as outlined    

## 2018-05-07 ENCOUNTER — Ambulatory Visit: Payer: Medicare Other | Admitting: Internal Medicine

## 2018-05-10 ENCOUNTER — Ambulatory Visit (INDEPENDENT_AMBULATORY_CARE_PROVIDER_SITE_OTHER): Payer: Medicare Other | Admitting: Internal Medicine

## 2018-05-10 ENCOUNTER — Encounter: Payer: Self-pay | Admitting: Internal Medicine

## 2018-05-10 VITALS — BP 112/80 | HR 82 | Ht 60.0 in | Wt 136.0 lb

## 2018-05-10 DIAGNOSIS — R05 Cough: Secondary | ICD-10-CM

## 2018-05-10 DIAGNOSIS — J449 Chronic obstructive pulmonary disease, unspecified: Secondary | ICD-10-CM | POA: Diagnosis not present

## 2018-05-10 DIAGNOSIS — R058 Other specified cough: Secondary | ICD-10-CM

## 2018-05-10 NOTE — Patient Instructions (Signed)
Follow the med calendar for changes as written today in red   Please schedule a follow up visit in 3 months but call sooner if needed

## 2018-05-10 NOTE — Assessment & Plan Note (Addendum)
-   PFTs 07/29/2012 FEV1 1.39 (61%)  Ratio 60 and no better p B2  DLCO 55%  > 114% - Spiromet ry 03/12/2018  FEV1 1.1 (56%)  Ratio 61 classic curvature, nothing prior   - 03/12/2018  After extensive coaching inhaler device,  effectiveness =    75% smi > try spiriva 2 each am sample   - 05/10/2018 no longer on stiolto due to cost but no change in symptoms   If worse will need ov with formulary

## 2018-05-10 NOTE — Assessment & Plan Note (Addendum)
Dg Es 05/18/15  Pos GERD  Recurrent from 08/2017 to 03/12/2018  - FENO 03/12/2018  =   7  - trial of gabapentin 100 tid 03/12/2018 > improved 05/10/2018    pcp rec take all the gabapentin at hs and try off the tramadol and I agreed with both recs.    >>>  If cough worse p change rx can add back gabapentin 100 bf/lunch/supper and continue the hs 300 mg      Each maintenance medication was reviewed in detail including most importantly the difference between maintenance and prns and under what circumstances the prns are to be triggered using an action plan format that is not reflected in the computer generated alphabetically organized AVS but trather by a customized med calendar that reflects the AVS meds with confirmed 100% correlation.   In addition, Please see AVS for unique instructions that I personally wrote and verbalized to the the pt in detail and then reviewed with pt  by my nurse highlighting any  changes in therapy recommended at today's visit to their plan of care.

## 2018-05-10 NOTE — Progress Notes (Signed)
Subjective:    Patient ID: Brittany Sparks, female    DOB: 07/11/1965  MRN: [id number]     Brief patient profile:  63 yobf  quit smoking 2008 p dx of asthma by [facility] around 2000 referred 07/07/2011 to  Pulmonary clinic by Vicenta Aly with GOLD II COPD documented  07/29/12     History of Present Illness  07/07/2011 1st pulmonary cc cough x 12 years, daily no treatment for asthma has helped the cough including advair then abupt onset cough and sob much worse   x 3 weeks changed bp meds x 2 weeks ? Was this an ace inhibitor? - cough is dry more day than night but present 24 h per day, minimal sob, controls with saba but takes up to 10 puffs at a time before improves. rec Try aciphex  20mg   Take 30-60 min before first meal of the day and Pepcid 20 mg one bedtime  Symbicort 160 Take 2 puffs first thing in am and then another 2 puffs about 12 hours later.  Work on inhaler technique:  Only use your albuterol(ventolin, blue inhaler) as a rescue medication .  Best cough medication is delsym     08/29/2013 f/u ov/Zamir Staples re: recurrent cough while on dexilant/ pepcid/ and dulera 200 2 bid no better Chief Complaint  Patient presents with  . Follow-up    Pt c/o increased cough for the past 7-8 months. Cough is occ prod with minimal light yellow sputum.  She also c/o occ SOB and wheezing.   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 Mostly sob when coughing, no better with saba  rec Prednisone 10 mg take  4 each am x 2 days,   2 each am x 2 days,  1 each am x 2 days and stop   Augmentin 875 mg take one pill twice daily  X 10 days - take at breakfast and supper with large glass of water.  It would help reduce the usual side effects (diarrhea and yeast infections) if you ate cultured yogurt at lunch.  First take delsym two tsp every 12 hours and supplement if needed with  tramadol 50 mg up to 2 every 4  hours   GERD   If not better in 2 weeks >  See [name] NP  > did not return as req      02/24/2018 acute extended ov/Brittany Sparks re: re-establish/ copd II  cough x 6 months  Chief Complaint  Patient presents with  . Follow-up    COPD- last seen 2016.  C/o worsening sob, prod cough.    indolent onset progressively worse cough   Dry daytime > noct worse with exp to any irritants or extremes of heat  Better at hs  Worse on BREO / not much better on pred  Sleeps with head elevated  Doe = MMRC2 = can't walk a nl pace on a flat grade s sob but does fine slow and flat  rec Prednisone 10 mg take  4 each am x 2 days,   2 each am x 2 days,  1 each am x 2 days and stop   Stop symbicort and  BREO and Only use your albuterol(Ventolin) as a rescue medication to be used if you can't catch your breath   First take mucinex dm 1200  every 12 hours and supplement if needed with  tramadol 50  mg up to 2 every 4 hours to suppress the urge to cough at all or even clear your throat. Swallowing water or using ice chips/non mint and menthol containing candies (such as lifesavers or sugarless jolly ranchers) are also effective.  You should rest your voice and avoid activities that you know make you cough. Once you have eliminated the cough for 3 straight days try reducing the tramadol first,  then the mucinex dm  as tolerated.   Pantoprazole (protonix) 40 mg   Take  30-60 min before first meal of the day and Pepcid (famotidine)  20 mg one @  bedtime until return   No work x 2 weeks  Please schedule a follow up office visit in 2 weeks, sooner if needed - bring all medications / inhalers /neb solutions with you     03/12/2018  f/u ov/Brittany Sparks re: cough since winter 2019  Chief Complaint  Patient presents with  . Follow-up    Cough went away right after last visit and then started back about 2 wks ago. She is using her albuterol inhaler 6 x daily on average. Her cough is prod at times with clear sputum.   cough ? Only better  while on suppression, day>>> noct, min productive with mucoid sputum No change in saba need on symb vs off Doe non variable of changed by stopping symb = MMRC2 = can't walk a nl pace on a flat grade s sob but does fine slow and flat  rec  Only use your albuterol(Ventolin) as a rescue medication   First take mucinex dm 1200  every 12 hours and supplement if needed with  tramadol 50 mg up to 2 every 4 hours to suppress the urge to cough at all or even clear your throat. Pantoprazole (protonix) 40 mg   Take  30-60 min before first meal of the day and Pepcid (famotidine)  20 mg one @  bedtime until return to office - this is the best way to tell whether stomach acid is contributing to your problem.   GERD Start gabapentin 100 mg three times daily  Until return   For drainage / throat tickle try take CHLORPHENIRAMINE  4 mg - take one every 4 hours as needed    05/10/2018  f/u ov/Brittany Sparks re: copd no change off stiolto due to cost/ uacs>>>  all smiles   min throat clearing / using med calendar well Not limited by breathing from desired activities   Sleeping fine    No obvious day to day or daytime variability or assoc excess/ purulent sputum or mucus plugs or hemoptysis or cp or chest tightness, subjective wheeze or overt sinus or hb symptoms.   Sleeping  without nocturnal  or early am exacerbation  of respiratory  c/o's or need for noct saba. Also denies any obvious fluctuation of symptoms with weather or environmental changes or other aggravating or alleviating factors except as outlined above   No unusual exposure hx or h/o childhood pna/ asthma or knowledge of premature birth.  Current Allergies, Complete Past Medical History, Past Surgical History, Family History, and Social History were reviewed in [employer] record.  ROS  The following are not active complaints unless bolded Hoarseness, sore throat, dysphagia, dental problems, itching, sneezing,  nasal congestion or  discharge of excess mucus or purulent secretions, ear ache,   fever, chills, sweats, unintended wt loss or wt gain, classically pleuritic or exertional cp,  orthopnea pnd or arm/hand swelling  or leg  swelling, presyncope, palpitations, abdominal pain, anorexia, nausea, vomiting, diarrhea  or change in bowel habits or change in bladder habits, change in stools or change in urine, dysuria, hematuria,  rash, arthralgias, visual complaints, headache, numbness, weakness or ataxia or problems with walking or coordination,  change in mood or  memory.        Current Meds  Medication Sig  . amLODipine (NORVASC) 5 MG tablet Take 5 mg by mouth daily.  Marland Kitchen dextromethorphan-guaiFENesin (MUCINEX DM) 30-600 MG 12hr tablet Take 1 tablet by mouth 2 (two) times daily as needed for cough.  . famotidine (PEPCID) 20 MG tablet One at bedtime  . gabapentin (NEURONTIN) 100 MG capsule Take 1 capsule (100 mg total) by mouth 3 (three) times daily. One three times daily  . HYDROcodone-acetaminophen (NORCO) 5-325 MG tablet Take 1 tablet by mouth every 6 (six) hours as needed for moderate pain.  Marland Kitchen ibuprofen (ADVIL,MOTRIN) 600 MG tablet Take 1 tablet (600 mg total) by mouth every 8 (eight) hours as needed.  Marland Kitchen olmesartan (BENICAR) 20 MG tablet Take 20 mg by mouth daily.  . pantoprazole (PROTONIX) 40 MG tablet Take 1 tablet (40 mg total) by mouth daily. Take 30-60 min before first meal of the day  . simvastatin (ZOCOR) 10 MG tablet Take 10 mg by mouth at bedtime.  . traMADol (ULTRAM) 50 MG tablet Take 1 tablet (50 mg total) by mouth every 4 (four) hours as needed.  . valACYclovir (VALTREX) 1000 MG tablet Take 1,000 mg by mouth 2 (two) times daily.                       Objective:   Physical Exam     Wt 149 07/07/2011 > 08/11/2011  153>>151 08/25/2011 >    146  04/26/2012 > 155 07/29/2012 > 06/11/2015 155> 02/24/2018  143 > 03/12/2018  140 > 05/10/2018    136    Vital signs reviewed - Note on arrival 02 sats  96% on RA       HEENT: nl dentition, turbinates bilaterally, and oropharynx. Nl external ear canals without cough reflex   NECK :  without JVD/Nodes/TM/ nl carotid upstrokes bilaterally   LUNGS: no acc muscle use,  Nl contour chest which is clear to A and P bilaterally without cough on insp or exp maneuvers   CV:  RRR  no s3 or murmur or increase in P2, and no edema   ABD:  soft and nontender with nl inspiratory excursion in the supine position. No bruits or organomegaly appreciated, bowel sounds nl  MS:  Nl gait/ ext warm without deformities, calf tenderness, cyanosis or clubbing No obvious joint restrictions   SKIN: warm and dry without lesions    NEURO:  alert, approp, nl sensorium with  no motor or cerebellar deficits apparent.         Assessment & Plan:

## 2018-05-29 ENCOUNTER — Other Ambulatory Visit: Payer: Self-pay | Admitting: Internal Medicine

## 2018-05-29 DIAGNOSIS — R058 Other specified cough: Secondary | ICD-10-CM

## 2018-05-29 DIAGNOSIS — R05 Cough: Secondary | ICD-10-CM

## 2018-07-02 ENCOUNTER — Other Ambulatory Visit: Payer: Self-pay | Admitting: Internal Medicine

## 2018-07-02 NOTE — Telephone Encounter (Signed)
Received refill request for Gabapentin. This was last refilled on 9.6.19 with quantity of 90 and 0 refills. Please advise if this is ok to refill, thank you.

## 2018-07-26 ENCOUNTER — Other Ambulatory Visit: Payer: Self-pay

## 2018-07-26 ENCOUNTER — Emergency Department
Admission: EM | Admit: 2018-07-26 | Discharge: 2018-07-26 | Disposition: A | Discharge status: Home / Self Care | Payer: Medicare Other | Attending: Emergency Medicine | Admitting: Emergency Medicine

## 2018-07-26 ENCOUNTER — Emergency Department: Payer: Medicare Other

## 2018-07-26 DIAGNOSIS — I10 Essential (primary) hypertension: Secondary | ICD-10-CM | POA: Diagnosis not present

## 2018-07-26 DIAGNOSIS — J449 Chronic obstructive pulmonary disease, unspecified: Secondary | ICD-10-CM | POA: Diagnosis not present

## 2018-07-26 DIAGNOSIS — R51 Headache: Secondary | ICD-10-CM | POA: Insufficient documentation

## 2018-07-26 DIAGNOSIS — R42 Dizziness and giddiness: Secondary | ICD-10-CM | POA: Insufficient documentation

## 2018-07-26 DIAGNOSIS — Z87891 Personal history of nicotine dependence: Secondary | ICD-10-CM | POA: Insufficient documentation

## 2018-07-26 DIAGNOSIS — R519 Headache, unspecified: Secondary | ICD-10-CM

## 2018-07-26 LAB — URINALYSIS, COMPLETE (UACMP) WITH MICROSCOPIC
BILIRUBIN URINE: NEGATIVE
Glucose, UA: NEGATIVE mg/dL
Hgb urine dipstick: NEGATIVE
KETONES UR: NEGATIVE mg/dL
LEUKOCYTES UA: NEGATIVE
NITRITE: NEGATIVE
Protein, ur: NEGATIVE mg/dL
Specific Gravity, Urine: 1.025 (ref 1.005–1.030)
pH: 6 (ref 5.0–8.0)

## 2018-07-26 LAB — BASIC METABOLIC PANEL
Anion gap: 5 (ref 5–15)
BUN: 13 mg/dL (ref 6–20)
CALCIUM: 8.9 mg/dL (ref 8.9–10.3)
CO2: 29 mmol/L (ref 22–32)
CREATININE: 0.66 mg/dL (ref 0.44–1.00)
Chloride: 103 mmol/L (ref 98–111)
Glucose, Bld: 110 mg/dL — ABNORMAL HIGH (ref 70–99)
Potassium: 3.5 mmol/L (ref 3.5–5.1)
SODIUM: 137 mmol/L (ref 135–145)

## 2018-07-26 LAB — CBC
HCT: 36.2 % (ref 36.0–46.0)
Hemoglobin: 11.7 g/dL — ABNORMAL LOW (ref 12.0–15.0)
MCH: 30.6 pg (ref 26.0–34.0)
MCHC: 32.3 g/dL (ref 30.0–36.0)
MCV: 94.8 fL (ref 80.0–100.0)
NRBC: 0 % (ref 0.0–0.2)
PLATELETS: 288 10*3/uL (ref 150–400)
RBC: 3.82 MIL/uL — ABNORMAL LOW (ref 3.87–5.11)
RDW: 12.4 % (ref 11.5–15.5)
WBC: 5.1 10*3/uL (ref 4.0–10.5)

## 2018-07-26 LAB — GLUCOSE, CAPILLARY: Glucose-Capillary: 105 mg/dL — ABNORMAL HIGH (ref 70–99)

## 2018-07-26 MED ORDER — KETOROLAC TROMETHAMINE 30 MG/ML IJ SOLN
30.0000 mg | Freq: Once | INTRAMUSCULAR | Status: AC
  Administered 2018-07-26: 30 mg via INTRAVENOUS
  Filled 2018-07-26: qty 1

## 2018-07-26 MED ORDER — SODIUM CHLORIDE 0.9% FLUSH: 3.0000 mL | Freq: Once | INTRAVENOUS | Status: DC

## 2018-07-26 MED ORDER — METOCLOPRAMIDE HCL 5 MG/ML IJ SOLN
10.0000 mg | Freq: Once | INTRAMUSCULAR | Status: AC
  Administered 2018-07-26: 10 mg via INTRAVENOUS
  Filled 2018-07-26: qty 2

## 2018-07-26 MED ORDER — SODIUM CHLORIDE 0.9 % IV SOLN
Freq: Once | INTRAVENOUS | Status: AC
  Administered 2018-07-26: 15:00:00 via INTRAVENOUS

## 2018-07-26 NOTE — ED Provider Notes (Signed)
[facility] Provider Note       Time seen: ----------------------------------------- 2:15 PM on 07/26/2018 -----------------------------------------   I have reviewed the triage vital signs and the nursing notes.  HISTORY   Chief Complaint Headache and Dizziness    HPI Brittany Sparks is a 53 y.o. female with a history of asthma, constipation, COPD, GERD, hepatitis C, hyperlipidemia, hypertension, small bowel obstruction who presents to the ED for sudden onset of dizziness, photophobia and headache across the frontal area with nausea.  Patient denies any vomiting or history of migraines.  She states symptoms started this morning while she was at work.  She denies any recent illness or other complaints.  Past Medical History:  Diagnosis Date  . Asthma   . Constipation   . COPD (chronic obstructive pulmonary disease) ([place])   . Drug addiction ([place])   . GERD (gastroesophageal reflux disease)   . Hepatitis C   . Herpes simplex type 2 infection   . Hyperlipidemia   . Hypertension   . Recurrent mouth ulceration   . SBO (small bowel obstruction) ([place])    2014    Patient Active Problem List   Diagnosis Date Noted  . Morbid obesity ([place]) 06/17/2015  . Shortness of breath 02/07/2013  . Chest pain 02/07/2013  . Upper airway cough syndrome 04/26/2012  . HTN (hypertension) 02/22/2012  . COPD GOLD II 07/08/2011    Past Surgical History:  Procedure Laterality Date  . ABDOMINAL HYSTERECTOMY    . CESAREAN SECTION    . CHOLECYSTECTOMY    . HERNIA REPAIR      Allergies Patient has no known allergies.  Social History Social History   Tobacco Use  . Smoking status: Former Smoker    Packs/day: 0.50    Years: 30.00    Pack years: 15.00    Types: Cigarettes    Last attempt to quit: 11/05/2006    Years since quitting: 11.7  . Smokeless tobacco: Never Used  Substance Use Topics  . Alcohol use: No  . Drug use: No   Review of  Systems Constitutional: Negative for fever. Cardiovascular: Negative for chest pain. Respiratory: Negative for shortness of breath. Gastrointestinal: Negative for abdominal pain, vomiting and diarrhea. Musculoskeletal: Negative for back pain. Skin: Negative for rash. Neurological: Positive for headache, dizziness  All systems negative/normal/unremarkable except as stated in the HPI  ____________________________________________   PHYSICAL EXAM:  VITAL SIGNS: ED Triage Vitals  Enc Vitals Group     BP 07/26/18 1226 120/88     Pulse Rate 07/26/18 1226 64     Resp --      Temp 07/26/18 1226 98 F (36.7 C)     Temp Source 07/26/18 1226 Oral     SpO2 07/26/18 1226 96 %     Weight 07/26/18 1226 140 lb (63.5 kg)     Height 07/26/18 1226 5' (1.524 m)     Head Circumference --      Peak Flow --      Pain Score 07/26/18 1232 8     Pain Loc --      Pain Edu? --      Excl. in [place]? --    Constitutional: Alert and oriented. Well appearing and in no distress. Eyes: Conjunctivae are normal. Normal extraocular movements.  Mild photophobia is noted ENT      Head: Normocephalic and atraumatic.      Nose: No congestion/rhinnorhea.      Mouth/Throat: Mucous membranes are moist.  Neck: No stridor. Cardiovascular: Normal rate, regular rhythm. No murmurs, rubs, or gallops. Respiratory: Normal respiratory effort without tachypnea nor retractions. Breath sounds are clear and equal bilaterally. No wheezes/rales/rhonchi. Gastrointestinal: Soft and nontender. Normal bowel sounds Musculoskeletal: Nontender with normal range of motion in extremities. No lower extremity tenderness nor edema. Neurologic:  Normal speech and language. No gross focal neurologic deficits are appreciated.  Strength and sensation appears to be normal Skin:  Skin is warm, dry and intact. No rash noted. Psychiatric: Mood and affect are normal. Speech and behavior are normal.   ____________________________________________  EKG: Interpreted by me.  Sinus rhythm rate 71 bpm, normal PR interval, normal QRS, normal QT  ____________________________________________  ED COURSE:  As part of my medical decision making, I reviewed the following data within the [place] History obtained from family if available, nursing notes, old chart and ekg, as well as notes from prior ED visits. Patient presented for sudden onset of dizziness and headache, we will assess with labs and imaging as indicated at this time.   Procedures ____________________________________________   LABS (pertinent positives/negatives)  Labs Reviewed  BASIC METABOLIC PANEL - Abnormal; Notable for the following components:      Result Value   Glucose, Bld 110 (*)    All other components within normal limits  CBC - Abnormal; Notable for the following components:   RBC 3.82 (*)    Hemoglobin 11.7 (*)    All other components within normal limits  URINALYSIS, COMPLETE (UACMP) WITH MICROSCOPIC - Abnormal; Notable for the following components:   Color, Urine YELLOW (*)    APPearance CLEAR (*)    All other components within normal limits  GLUCOSE, CAPILLARY - Abnormal; Notable for the following components:   Glucose-Capillary 105 (*)    All other components within normal limits  CBG MONITORING, ED    RADIOLOGY Images were viewed by me  CT head IMPRESSION: Study within normal limits. ____________________________________________   DIFFERENTIAL DIAGNOSIS   Migraine, tension headache, dehydration, electrolyte abnormality, hypoglycemia, subarachnoid hemorrhage  FINAL ASSESSMENT AND PLAN  Headache, dizziness   Plan: The patient had presented for an onset headache and dizziness. Patient's labs were unremarkable. Patient's imaging also unremarkable.  She was given fluids as well as Reglan and Toradol.  I have low suspicion for worrisome etiology.   Laurence Aly,  MD    Note: This note was generated in part or whole with voice recognition software. Voice recognition is usually quite accurate but there are transcription errors that can and very often do occur. I apologize for any typographical errors that were not detected and corrected.     Earleen Newport, MD 07/26/18 1452

## 2018-07-26 NOTE — ED Triage Notes (Signed)
Pt states she was sitting at her station at work today and had sudden onset dizziness. Photophobia, HA across the frontal area with nausea. Denies vomiting. Denies hx of migraines. Pt is a/ox4 on arrival, respirations WNL, skin is warm and dry. Speech is clear. Pt drove herself here today.

## 2018-07-26 NOTE — ED Triage Notes (Signed)
First Nurse Note:  C/O nausea.  Onset this morning while at work.  Patient is AAOx3.  Skin warm and dry. NAD

## 2018-07-26 NOTE — ED Provider Notes (Signed)
-----------------------------------------   4:25 PM on 07/26/2018 -----------------------------------------  I took over care on this patient from Dr. [facility].  The patient was pending reassessment after medication for headache.  Patient states that the headache is almost completely resolved and she feels well to go home.  She is stable for discharge at this time.  Return precautions given, and she expresses understanding.   Arta Silence, MD 07/26/18 [phone]

## 2018-08-10 ENCOUNTER — Ambulatory Visit (INDEPENDENT_AMBULATORY_CARE_PROVIDER_SITE_OTHER): Payer: Medicare Other | Admitting: Internal Medicine

## 2018-08-10 ENCOUNTER — Encounter: Payer: Self-pay | Admitting: Internal Medicine

## 2018-08-10 DIAGNOSIS — R05 Cough: Secondary | ICD-10-CM | POA: Diagnosis not present

## 2018-08-10 DIAGNOSIS — R058 Other specified cough: Secondary | ICD-10-CM

## 2018-08-10 DIAGNOSIS — J449 Chronic obstructive pulmonary disease, unspecified: Secondary | ICD-10-CM | POA: Diagnosis not present

## 2018-08-10 MED ORDER — TRAMADOL HCL 50 MG PO TABS: 50.0000 mg | ORAL_TABLET | ORAL | 0 refills | Status: DC | PRN

## 2018-08-10 MED ORDER — TIOTROPIUM BROMIDE MONOHYDRATE 2.5 MCG/ACT IN AERS: 2.0000 | INHALATION_SPRAY | Freq: Every day | RESPIRATORY_TRACT | 0 refills | Status: DC

## 2018-08-10 NOTE — Progress Notes (Signed)
Subjective:    Patient ID: Brittany Sparks, female    DOB: 07/11/1965  MRN: [id number]     Brief patient profile:  63 yobf  quit smoking 2008 p dx of asthma by [facility] around 2000 referred 07/07/2011 to  Pulmonary clinic by Vicenta Aly with GOLD II COPD documented  07/29/12     History of Present Illness  07/07/2011 1st pulmonary cc cough x 12 years, daily no treatment for asthma has helped the cough including advair then abupt onset cough and sob much worse   x 3 weeks changed bp meds x 2 weeks ? Was this an ace inhibitor? - cough is dry more day than night but present 24 h per day, minimal sob, controls with saba but takes up to 10 puffs at a time before improves. rec Try aciphex  20mg   Take 30-60 min before first meal of the day and Pepcid 20 mg one bedtime  Symbicort 160 Take 2 puffs first thing in am and then another 2 puffs about 12 hours later.  Work on inhaler technique:  Only use your albuterol(ventolin, blue inhaler) as a rescue medication .  Best cough medication is delsym     08/29/2013 f/u ov/Wert re: recurrent cough while on dexilant/ pepcid/ and dulera 200 2 bid no better Chief Complaint  Patient presents with  . Follow-up    Pt c/o increased cough for the past 7-8 months. Cough is occ prod with minimal light yellow sputum.  She also c/o occ SOB and wheezing.   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 Mostly sob when coughing, no better with saba  rec Prednisone 10 mg take  4 each am x 2 days,   2 each am x 2 days,  1 each am x 2 days and stop   Augmentin 875 mg take one pill twice daily  X 10 days - take at breakfast and supper with large glass of water.  It would help reduce the usual side effects (diarrhea and yeast infections) if you ate cultured yogurt at lunch.  First take delsym two tsp every 12 hours and supplement if needed with  tramadol 50 mg up to 2 every 4  hours   GERD   If not better in 2 weeks >  See [name] NP  > did not return as req      02/24/2018 acute extended ov/Wert re: re-establish/ copd II  cough x 6 months  Chief Complaint  Patient presents with  . Follow-up    COPD- last seen 2016.  C/o worsening sob, prod cough.    indolent onset progressively worse cough   Dry daytime > noct worse with exp to any irritants or extremes of heat  Better at hs  Worse on BREO / not much better on pred  Sleeps with head elevated  Doe = MMRC2 = can't walk a nl pace on a flat grade s sob but does fine slow and flat  rec Prednisone 10 mg take  4 each am x 2 days,   2 each am x 2 days,  1 each am x 2 days and stop   Stop symbicort and  BREO and Only use your albuterol(Ventolin) as a rescue medication to be used if you can't catch your breath   First take mucinex dm 1200  every 12 hours and supplement if needed with  tramadol 50  mg up to 2 every 4 hours to suppress the urge to cough at all or even clear your throat. Swallowing water or using ice chips/non mint and menthol containing candies (such as lifesavers or sugarless jolly ranchers) are also effective.  You should rest your voice and avoid activities that you know make you cough. Once you have eliminated the cough for 3 straight days try reducing the tramadol first,  then the mucinex dm  as tolerated.   Pantoprazole (protonix) 40 mg   Take  30-60 min before first meal of the day and Pepcid (famotidine)  20 mg one @  bedtime until return   No work x 2 weeks  Please schedule a follow up office visit in 2 weeks, sooner if needed - bring all medications / inhalers /neb solutions with you     03/12/2018  f/u ov/Wert re: cough since winter 2019  Chief Complaint  Patient presents with  . Follow-up    Cough went away right after last visit and then started back about 2 wks ago. She is using her albuterol inhaler 6 x daily on average. Her cough is prod at times with clear sputum.   cough ? Only better  while on suppression, day>>> noct, min productive with mucoid sputum No change in saba need on symb vs off Doe non variable of changed by stopping symb = MMRC2 = can't walk a nl pace on a flat grade s sob but does fine slow and flat  rec  Only use your albuterol(Ventolin) as a rescue medication   First take mucinex dm 1200  every 12 hours and supplement if needed with  tramadol 50 mg up to 2 every 4 hours to suppress the urge to cough at all or even clear your throat. Pantoprazole (protonix) 40 mg   Take  30-60 min before first meal of the day and Pepcid (famotidine)  20 mg one @  bedtime until return to office - this is the best way to tell whether stomach acid is contributing to your problem.   GERD Start gabapentin 100 mg three times daily  Until return   For drainage / throat tickle try take CHLORPHENIRAMINE  4 mg - take one every 4 hours as needed    05/10/2018  f/u ov/Wert re: copd no change off stiolto due to cost/ uacs>>>  all smiles   min throat clearing / using med calendar well Not limited by breathing from desired activities   Sleeping fine  rec Follow the med calendar for changes as written today in red    08/10/2018  f/u ov/Wert re:  GOLD II/ chronic cough  Chief Complaint  Patient presents with  . Follow-up    COPD GOLD II    Dyspnea:  MMRC2 = can't walk a nl pace on a flat grade s sob but does fine slow and flat  Cough: worse x one month, esp after sits up in bed in am but does not wake her prematurely Sleeping: on back on flat bed / 2 pillows  SABA use: only thing that helps is spiriva  02: none    No obvious day to day or daytime variability or assoc excess/ purulent sputum or mucus plugs or hemoptysis or cp or chest tightness, subjective wheeze or overt sinus or hb symptoms.   Sleeping  without nocturnal  or early am exacerbation  of respiratory  c/o's or need for noct saba. Also denies any obvious fluctuation of symptoms with weather or environmental changes  or  other aggravating or alleviating factors except as outlined above   No unusual exposure hx or h/o childhood pna/ asthma or knowledge of premature birth.  Current Allergies, Complete Past Medical History, Past Surgical History, Family History, and Social History were reviewed in [employer] record.  ROS  The following are not active complaints unless bolded Hoarseness, sore throat, dysphagia, dental problems, itching, sneezing,  nasal congestion or discharge of excess mucus or purulent secretions, ear ache,   fever, chills, sweats, unintended wt loss or wt gain, classically pleuritic or exertional cp,  orthopnea pnd or arm/hand swelling  or leg swelling, presyncope, palpitations, abdominal pain, anorexia, nausea, vomiting, diarrhea  or change in bowel habits or change in bladder habits, change in stools or change in urine, dysuria, hematuria,  rash, arthralgias, visual complaints, headache, numbness, weakness or ataxia or problems with walking or coordination,  change in mood or  memory.        Current Meds  Medication Sig  . amLODipine (NORVASC) 5 MG tablet Take 5 mg by mouth daily.  Marland Kitchen dextromethorphan-guaiFENesin (MUCINEX DM) 30-600 MG 12hr tablet Take 1 tablet by mouth 2 (two) times daily as needed for cough.  . famotidine (PEPCID) 20 MG tablet One at bedtime  . gabapentin (NEURONTIN) 100 MG capsule TAKE ONE CAPSULE BY MOUTH THREE TIMES DAILY  . HYDROcodone-acetaminophen (NORCO) 5-325 MG tablet Take 1 tablet by mouth every 6 (six) hours as needed for moderate pain.  Marland Kitchen ibuprofen (ADVIL,MOTRIN) 600 MG tablet Take 1 tablet (600 mg total) by mouth every 8 (eight) hours as needed.  Marland Kitchen olmesartan (BENICAR) 20 MG tablet Take 20 mg by mouth daily.  . pantoprazole (PROTONIX) 40 MG tablet TAKE 1 TABLET(40 MG) BY MOUTH DAILY 30 TO 60 MINUTES BEFORE FIRST MEAL OF THE DAY  . simvastatin (ZOCOR) 10 MG tablet Take 10 mg by mouth at bedtime.  . traMADol (ULTRAM) 50 MG tablet Take 1  tablet (50 mg total) by mouth every 4 (four) hours as needed.  . valACYclovir (VALTREX) 1000 MG tablet Take 1,000 mg by mouth 2 (two) times daily.              Objective:   Physical Exam     Wt 149 07/07/2011 > 08/11/2011  153>>151 08/25/2011 >    146  04/26/2012 > 155 07/29/2012 > 06/11/2015 155> 02/24/2018  143 > 03/12/2018  140 > 05/10/2018    136 > 08/10/2018   150     Vital signs reviewed - Note on arrival 02 sats  95% on RA     HEENT: nl dentition / oropharynx. Nl external ear canals without cough reflex -  Mildbilateral non-specific turbinate edema     NECK :  without JVD/Nodes/TM/ nl carotid upstrokes bilaterally   LUNGS: no acc muscle use,  Mild barrel  contour chest wall with bilateral  Distant bs s audible wheeze and  without cough on insp or exp maneuver and mild  Hyperresonant  to  percussion bilaterally     CV:  RRR  no s3 or murmur or increase in P2, and no edema   ABD:  soft and nontender with pos late  insp Hoover's  in the supine position. No bruits or organomegaly appreciated, bowel sounds nl  MS:   Nl gait/  ext warm without deformities, calf tenderness, cyanosis or clubbing No obvious joint restrictions   SKIN: warm and dry without lesions    NEURO:  alert, approp, nl sensorium with  no  motor or cerebellar deficits apparent.                  Assessment & Plan:

## 2018-08-10 NOTE — Patient Instructions (Signed)
Ok to add tramadol 50 mg one every 4 hours if can't stop the coughing  Add spiriva 2.5  X one pff daily x one week and if not better then take 2   Please schedule a follow up office visit in 4 weeks, sooner if needed  with all medications /inhalers/ solutions in hand so we can verify exactly what you are taking. This includes all medications from all doctors and over the counters  - bring med calendar

## 2018-08-11 ENCOUNTER — Encounter: Payer: Self-pay | Admitting: Internal Medicine

## 2018-08-11 NOTE — Assessment & Plan Note (Addendum)
Stopped smoking 2008 - PFTs 07/29/2012 FEV1 1.39 (61%)  Ratio 60 and no better p B2  DLCO 55%  > 114% - Spirometry 03/12/2018  FEV1 1.1 (56%)  Ratio 61 classic curvature, nothing prior   - 03/12/2018  After extensive coaching inhaler device,  effectiveness =    75% smi > try spiriva 2 each am sample  - 05/10/2018 no longer on stiolto due to cost but no change in symptoms   - 08/10/2018  After extensive coaching inhaler device,  effectiveness =  90% from a baseline of < 50%  Given that baseline smi technique was so poor, it may be that 1 pff of spiriva used correctly will give her the same benefit as 2 pffs used poorly (which is what she reports works the best)

## 2018-08-11 NOTE — Assessment & Plan Note (Signed)
Cough onset around 2000  Dg Es 05/18/15  Pos GERD  Recurrent from 08/2017 to 03/12/2018  - FENO 03/12/2018  =   7  - trial of gabapentin 100 tid 03/12/2018 > improved 05/10/2018   Worse with taper so rec restart gabapentin and use tramadol short term to control cyclical cough then return in 4 weeks with all meds in hand using a trust but verify approach to confirm accurate Medication  Reconciliation The principal here is that until we are certain that the  patients are doing what we've asked, it makes no sense to ask them to do more.    I had an extended discussion with the patient reviewing all relevant studies completed to date and  lasting 15 to 20 minutes of a 25 minute visit    See device teaching which extended face to face time for this visit   Each maintenance medication was reviewed in detail including most importantly the difference between maintenance and prns and under what circumstances the prns are to be triggered using an action plan format that is not reflected in the computer generated alphabetically organized AVS but trather by a customized med calendar that reflects the AVS meds with confirmed 100% correlation.   In addition, Please see AVS for unique instructions that I personally wrote and verbalized to the the pt in detail and then reviewed with pt  by my nurse highlighting any  changes in therapy recommended at today's visit to their plan of care.

## 2018-09-07 ENCOUNTER — Ambulatory Visit: Payer: Medicare Other | Admitting: Internal Medicine

## 2018-10-01 ENCOUNTER — Telehealth: Payer: Self-pay | Admitting: Internal Medicine

## 2018-10-01 MED ORDER — PREDNISONE 10 MG PO TABS: ORAL_TABLET | ORAL | 0 refills | Status: DC

## 2018-10-01 NOTE — Telephone Encounter (Signed)
Spoke with pt. She is aware of MW's recommendations. Rx has been sent in. States that she will call back for a televisit if she does not improve. Nothing further was needed.

## 2018-10-01 NOTE — Telephone Encounter (Signed)
Prednisone 10 mg take  4 each am x 2 days,   2 each am x 2 days,  1 each am x 2 days and stop   Set up for televisit with me in one week  - call sooner if not doing better

## 2018-10-01 NOTE — Telephone Encounter (Signed)
Spoke with pt. States that she is not feeling well. Reports increased wheezing, cough and shortness of breath. Cough is producing mucus but she is not sure of the color. Denies chest tightness, fever, sick contacts or recent travel. States that she started working a new job in a [employer] and it's very dusty in there. Thinks her symptoms could be coming from that. Would like MW's recommendations.  MW - please advise. Thanks.

## 2018-10-04 ENCOUNTER — Other Ambulatory Visit: Payer: Self-pay | Admitting: Internal Medicine

## 2018-10-04 MED ORDER — TRAMADOL HCL 50 MG PO TABS: 50.0000 mg | ORAL_TABLET | ORAL | 0 refills | Status: DC | PRN

## 2018-10-06 ENCOUNTER — Other Ambulatory Visit: Payer: Self-pay | Admitting: Internal Medicine

## 2018-10-06 DIAGNOSIS — R05 Cough: Secondary | ICD-10-CM

## 2018-10-06 DIAGNOSIS — R058 Other specified cough: Secondary | ICD-10-CM

## 2018-10-19 ENCOUNTER — Telehealth: Payer: Self-pay | Admitting: Internal Medicine

## 2018-10-19 NOTE — Telephone Encounter (Signed)
Called and spoke w/ pt. Informed her of MW's recommendations to have an office visit. Pt verbalized understanding and agreed to coming in for an office visit on 10/22/2018 at 2:00 PM. NEG for COVID-19 screen. Informed pt to bring meds/inhalers/med calendar as requested by MW. Appt has been scheduled. Nothing further needed at this time.

## 2018-10-19 NOTE — Telephone Encounter (Signed)
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 She states there is a lot of dust and particles in the air. She states she usually calls in and gets prednisone but she states the prednisone will not help if she continues to be in this environment. She states she was cold and having chills yesterday but this has since resolved. She states she has only been there 6 weeks. She was advised not to take this particular job due to her Asthma and COPD but she needed the job.   She is requesting a note stating she is high is risk due to her Asthma and COPD and to take her out of work permanently due to dust and particles in the air exacerbating her SOB and coughing.   MW please advise. Thanks.

## 2018-10-19 NOTE — Telephone Encounter (Signed)
Agree probably not the best job for her but doubt she'll meet the criteria for full disability - in reviewing her records we did not document her responset to symbicort/ spiriva combo and would need to do that with ov before can say she can't work  Would see if she can come in for [place] this Friday 10/22/18 to sort out but it's critical that she bring all meds/inhalers/med calendars with her as I rec at last ov (she missed the f/u I intended if you look at the AVS instructions and any 3rd party reviewing her chart would see that clearly and work against her in any process for disability eval)

## 2018-10-19 NOTE — Telephone Encounter (Signed)
LMTCB

## 2018-10-19 NOTE — Telephone Encounter (Signed)
Pt calling back again.

## 2018-10-22 ENCOUNTER — Ambulatory Visit (INDEPENDENT_AMBULATORY_CARE_PROVIDER_SITE_OTHER): Payer: Medicare Other | Admitting: Internal Medicine

## 2018-10-22 ENCOUNTER — Encounter: Payer: Self-pay | Admitting: Internal Medicine

## 2018-10-22 ENCOUNTER — Other Ambulatory Visit: Payer: Self-pay

## 2018-10-22 ENCOUNTER — Encounter: Payer: Self-pay | Admitting: *Deleted

## 2018-10-22 VITALS — BP 110/70 | HR 89 | Temp 98.3°F | Ht 60.0 in | Wt 151.0 lb

## 2018-10-22 DIAGNOSIS — J449 Chronic obstructive pulmonary disease, unspecified: Secondary | ICD-10-CM

## 2018-10-22 DIAGNOSIS — R05 Cough: Secondary | ICD-10-CM

## 2018-10-22 DIAGNOSIS — R058 Other specified cough: Secondary | ICD-10-CM

## 2018-10-22 MED ORDER — BUDESONIDE-FORMOTEROL FUMARATE 80-4.5 MCG/ACT IN AERO: 2.0000 | INHALATION_SPRAY | Freq: Two times a day (BID) | RESPIRATORY_TRACT | 0 refills | Status: DC

## 2018-10-22 MED ORDER — BUDESONIDE-FORMOTEROL FUMARATE 80-4.5 MCG/ACT IN AERO: 2.0000 | INHALATION_SPRAY | Freq: Two times a day (BID) | RESPIRATORY_TRACT | 11 refills | Status: DC

## 2018-10-22 NOTE — Progress Notes (Signed)
Subjective:    Patient ID: Brittany Sparks, female    DOB: 07/11/1965  MRN: [id number]     Brief patient profile:  63 yobf  quit smoking 2008 p dx of asthma by [facility] around 2000 referred 07/07/2011 to  Pulmonary clinic by Vicenta Aly with GOLD II COPD documented  07/29/12     History of Present Illness  07/07/2011 1st pulmonary cc cough x 12 years, daily no treatment for asthma has helped the cough including advair then abupt onset cough and sob much worse   x 3 weeks changed bp meds x 2 weeks ? Was this an ace inhibitor? - cough is dry more day than night but present 24 h per day, minimal sob, controls with saba but takes up to 10 puffs at a time before improves. rec Try aciphex  20mg   Take 30-60 min before first meal of the day and Pepcid 20 mg one bedtime  Symbicort 160 Take 2 puffs first thing in am and then another 2 puffs about 12 hours later.  Work on inhaler technique:  Only use your albuterol(ventolin, blue inhaler) as a rescue medication .  Best cough medication is delsym     08/29/2013 f/u ov/Sheng Pritz re: recurrent cough while on dexilant/ pepcid/ and dulera 200 2 bid no better Chief Complaint  Patient presents with  . Follow-up    Pt c/o increased cough for the past 7-8 months. Cough is occ prod with minimal light yellow sputum.  She also c/o occ SOB and wheezing.   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 Mostly sob when coughing, no better with saba  rec Prednisone 10 mg take  4 each am x 2 days,   2 each am x 2 days,  1 each am x 2 days and stop   Augmentin 875 mg take one pill twice daily  X 10 days - take at breakfast and supper with large glass of water.  It would help reduce the usual side effects (diarrhea and yeast infections) if you ate cultured yogurt at lunch.  First take delsym two tsp every 12 hours and supplement if needed with  tramadol 50 mg up to 2 every 4  hours   GERD   If not better in 2 weeks >  See [name] NP  > did not return as req      02/24/2018 acute extended ov/Yamato Kopf re: re-establish/ copd II  cough x 6 months  Chief Complaint  Patient presents with  . Follow-up    COPD- last seen 2016.  C/o worsening sob, prod cough.    indolent onset progressively worse cough   Dry daytime > noct worse with exp to any irritants or extremes of heat  Better at hs  Worse on BREO / not much better on pred  Sleeps with head elevated  Doe = MMRC2 = can't walk a nl pace on a flat grade s sob but does fine slow and flat  rec Prednisone 10 mg take  4 each am x 2 days,   2 each am x 2 days,  1 each am x 2 days and stop   Stop symbicort and  BREO and Only use your albuterol(Ventolin) as a rescue medication to be used if you can't catch your breath   First take mucinex dm 1200  every 12 hours and supplement if needed with  tramadol 50  mg up to 2 every 4 hours to suppress the urge to cough at all or even clear your throat.  Pantoprazole (protonix) 40 mg   Take  30-60 min before first meal of the day and Pepcid (famotidine)  20 mg one @  bedtime until return   No work x 2 weeks  Please schedule a follow up office visit in 2 weeks, sooner if needed - bring all medications / inhalers /neb solutions with you     03/12/2018  f/u ov/Rohin Krejci re: cough since winter 2019  Chief Complaint  Patient presents with  . Follow-up    Cough went away right after last visit and then started back about 2 wks ago. She is using her albuterol inhaler 6 x daily on average. Her cough is prod at times with clear sputum.   cough ? Only better while on suppression, day>>> noct, min productive with mucoid sputum No change in saba need on symb vs off Doe non variable of changed by stopping symb = MMRC2 = can't walk a nl pace on a flat grade s sob but does fine slow and flat  rec  Only use your albuterol(Ventolin) as a rescue medication   First take mucinex dm 1200  every 12 hours and  supplement if needed with  tramadol 50 mg up to 2 every 4 hours to suppress the urge to cough at all or even clear your throat. Pantoprazole (protonix) 40 mg   Take  30-60 min before first meal of the day and Pepcid (famotidine)  20 mg one @  bedtime until return to office - this is the best way to tell whether stomach acid is contributing to your problem.   GERD Start gabapentin 100 mg three times daily  Until return   For drainage / throat tickle try take CHLORPHENIRAMINE  4 mg - take one every 4 hours as needed    05/10/2018  f/u ov/Rudene Poulsen re: copd no change off stiolto due to cost/ uacs>>>  all smiles   min throat clearing / using med calendar well Not limited by breathing from desired activities   Sleeping fine  rec Follow the med calendar for changes as written today in red    08/10/2018  f/u ov/Marlan Steward re:  GOLD II/ chronic cough  Chief Complaint  Patient presents with  . Follow-up    COPD GOLD II   Dyspnea:  MMRC2 = can't walk a nl pace on a flat grade s sob but does fine slow and flat  Cough: worse x one month, esp after sits up in bed in am but does not wake her prematurely Sleeping: on back on flat bed / 2 pillows  SABA use: only thing that helps is spiriva  rec Ok to add tramadol 50 mg one every 4 hours if can't stop the coughing Add spiriva 2.5  X one pff daily x one week and if not better then take 2  Please schedule a follow up office visit in 4 weeks, sooner if needed  with all medications /inhalers/ solutions in hand so we can verify exactly what you are taking. 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 Acute Visit    Pt c/o increased SOB, wheezing, chest tightness x 3  wks. She has been using her albuterol inhaler 3 x daily on average. Her cough has been  prod with light yellow sputum.   not using spiriva (only samples) and says using lots of albuterol but counter still on 200  Sleeping is not a problem on 2 pillows    No obvious day to day or daytime variability or assoc  mucus plugs or hemoptysis or cp or  overt sinus or hb symptoms.   Sleeping  without nocturnal  or early am exacerbation  of respiratory  c/o's or need for noct saba. Also denies any obvious fluctuation of symptoms with weather or environmental changes or other aggravating or alleviating factors except as outlined above   No unusual exposure hx or h/o childhood pna/ asthma or knowledge of premature birth.  Current Allergies, Complete Past Medical History, Past Surgical History, Family History, and Social History were reviewed in [employer] record.  ROS  The following are not active complaints unless bolded Hoarseness, sore throat, dysphagia, dental problems, itching, sneezing,  nasal congestion or discharge of excess mucus or purulent secretions, ear ache,   fever, chills, sweats, unintended wt loss or wt gain, classically pleuritic or exertional cp,  orthopnea pnd or arm/hand swelling  or leg swelling, presyncope, palpitations, abdominal pain, anorexia, nausea, vomiting, diarrhea  or change in bowel habits or change in bladder habits, change in stools or change in urine, dysuria, hematuria,  rash, arthralgias, visual complaints, headache, numbness, weakness or ataxia or problems with walking or coordination,  change in mood or  memory.        Current Meds  Medication Sig  . amLODipine (NORVASC) 5 MG tablet Take 5 mg by mouth daily.  . famotidine (PEPCID) 20 MG tablet One at bedtime  . gabapentin (NEURONTIN) 100 MG capsule TAKE ONE CAPSULE BY MOUTH THREE TIMES DAILY  . olmesartan (BENICAR) 20 MG tablet Take 20 mg by mouth daily.  . pantoprazole (PROTONIX) 40 MG tablet TAKE 1 TABLET(40 MG) BY MOUTH DAILY 30 TO 60 MINUTES BEFORE FIRST MEAL OF THE DAY  .  simvastatin (ZOCOR) 10 MG tablet Take 10 mg by mouth at bedtime.  . Tiotropium Bromide Monohydrate (SPIRIVA RESPIMAT) 2.5 MCG/ACT AERS Inhale 2 puffs into the lungs daily.  . traMADol (ULTRAM) 50 MG tablet Take 1 tablet (50 mg total) by mouth every 4 (four) hours as needed.  . valACYclovir (VALTREX) 1000 MG tablet Take 1,000 mg by mouth 2 (two) times daily.                Objective:   Physical Exam     Wt 149 07/07/2011 > 08/11/2011  153>>151 08/25/2011 >    146  04/26/2012 > 155 07/29/2012 > 06/11/2015 155> 02/24/2018  143 > 03/12/2018  140 > 05/10/2018    136 > 08/10/2018   150 > 10/22/2018  151      amb bf nad  Cough at end of exp    HEENT: nl dentition / oropharynx. Nl external ear canals without cough reflex -  Mild bilateral non-specific turbinate edema     NECK :  without JVD/Nodes/TM/ nl carotid upstrokes bilaterally   LUNGS: no acc muscle use,  Mild barrel  contour chest wall with bilateral  Distant bs c late exp  wheezes and  With cough on exp maneuver and mild  Hyperresonant  to  percussion bilaterally     CV:  RRR  no s3 or murmur or increase in P2, and no edema  ABD:  soft and nontender with pos end  insp Hoover's  in the supine position. No bruits or organomegaly appreciated, bowel sounds nl  MS:   Nl gait/  ext warm without deformities, calf tenderness, cyanosis or clubbing No obvious joint restrictions   SKIN: warm and dry without lesions    NEURO:  alert, approp, nl sensorium with  no motor or cerebellar deficits apparent.                   Assessment & Plan:

## 2018-10-22 NOTE — Patient Instructions (Addendum)
You will not be able to work in an environment that is dusty or  Hot or has any combustion exhaust exposure - needs to be clean and climate controlled in a sitting position.  Plan A = Automatic = Symbicort 80 Take 2 puffs first thing in am and then another 2 puffs about 12 hours later.    Plan B = Backup Only use your albuterol (ventolin) inhaler as a rescue medication to be used if you can't catch your breath by resting or doing a relaxed purse lip breathing pattern.  - The less you use it, the better it will work when you need it. - Ok to use the inhaler up to 2 puffs  every 4 hours if you must but call for appointment if use goes up over your usual need - Don't leave home without it !!  (think of it like the spare tire for your car)   As needed for cough > mucinex dm up to 1200 mg every 12 hours and supplement with tramadol if needed    Please schedule a follow up visit in 3 months but call sooner if needed  with all medications /inhalers/ solutions in hand so we can verify exactly what you are taking. This includes all medications from all doctors and over the counters  - pfts on return with no inhalers that day if possible

## 2018-10-24 ENCOUNTER — Encounter: Payer: Self-pay | Admitting: Internal Medicine

## 2018-10-24 NOTE — Assessment & Plan Note (Signed)
Stopped smoking 2008 - PFTs 07/29/2012 FEV1 1.39 (61%)  Ratio 60 and no better p B2  DLCO 55%  > 114% - Spirometry 03/12/2018  FEV1 1.1 (56%)  Ratio 61 classic curvature, nothing prior   - 03/12/2018  After extensive coaching inhaler device,  effectiveness =    75% smi > try spiriva 2 each am sample  - 05/10/2018 no longer on stiolto due to cost but no change in symptoms   . 10/22/2018  After extensive coaching inhaler device,  effectiveness =    75% and coughing on spiriva so changed to symbicort 80 2bid    Flare of day > noct cough and not consistent with spiriva (still has full sample from last ov and never filled rx) but symbicort in low doses probably better choice as has exp wheeze on exam and acting now more like an AB pt

## 2018-10-24 NOTE — Assessment & Plan Note (Signed)
Cough onset around 2000  Dg Es 05/18/15  Pos GERD  Recurrent from 08/2017 to 03/12/2018  - FENO 03/12/2018  =   7  - trial of gabapentin 100 tid 03/12/2018 > improved 05/10/2018   Says still taking gabapentin / gerd rx so try to control cough with symb 80 2bid and restrict work environment to avoid hot /dusty/combustion exhaust exp and not better then return with all meds in hand using a trust but verify approach to confirm accurate Medication  Reconciliation The principal here is that until we are certain that the  patients are doing what we've asked, it makes no sense to ask them to do more.    I had an extended discussion with the patient reviewing all relevant studies completed to date and  lasting 15 to 20 minutes of a 25 minute visit    See device teaching which extended face to face time for this visit.  Each maintenance medication was reviewed in detail including emphasizing most importantly the difference between maintenance and prns and under what circumstances the prns are to be triggered using an action plan format that is not reflected in the computer generated alphabetically organized AVS which I have not found useful in most complex patients, especially with respiratory illnesses  Please see AVS for specific instructions unique to this visit that I personally wrote and verbalized to the the pt in detail and then reviewed with pt  by my nurse highlighting any  changes in therapy recommended at today's visit to their plan of care.

## 2018-10-27 ENCOUNTER — Encounter: Payer: Self-pay | Admitting: *Deleted

## 2018-10-27 NOTE — Congregational Nurse Program (Signed)
  Dept: [facility] Nurse Program Note  Date of Encounter: 10/27/2018  Past Medical History: Past Medical History:  Diagnosis Date  . Asthma   . Constipation   . COPD (chronic obstructive pulmonary disease) ([place])   . Drug addiction ([place])   . GERD (gastroesophageal reflux disease)   . Hepatitis C   . Herpes simplex type 2 infection   . Hyperlipidemia   . Hypertension   . Recurrent mouth ulceration   . SBO (small bowel obstruction) ([place])    2014    Encounter Details:

## 2018-10-27 NOTE — Congregational Nurse Program (Signed)
Pt has a cough, has hx of asthma.no temp > or equal to 100.4 and no difficulty breathing.  Does not need assistance with medication. No other health issues reported.

## 2018-11-08 NOTE — Congregational Nurse Program (Signed)
Closing encounter per request. 

## 2018-11-15 ENCOUNTER — Other Ambulatory Visit: Payer: Self-pay | Admitting: Internal Medicine

## 2018-11-15 MED ORDER — GABAPENTIN 100 MG PO CAPS: 100.0000 mg | ORAL_CAPSULE | Freq: Three times a day (TID) | ORAL | 2 refills | Status: DC

## 2018-12-15 ENCOUNTER — Other Ambulatory Visit: Payer: Self-pay | Admitting: Internal Medicine

## 2019-01-11 ENCOUNTER — Telehealth: Payer: Self-pay | Admitting: Internal Medicine

## 2019-01-11 NOTE — Telephone Encounter (Signed)
Ok to refill x one on the tramadol but ok to cancel pfts until released by gi

## 2019-01-11 NOTE — Telephone Encounter (Signed)
Dr. Melvyn Novas, please advise if you feel that pt should still have PFT performed based on info stated by pt in regards to GI stating that her cough is just coming from her acid reflux. Also, please advise if you are okay refilling pt's Tramadol. Thanks!  Instructions  You will not be able to work in an environment that is dusty or  Hot or has any combustion exhaust exposure - needs to be clean and climate controlled in a sitting position.  Plan A = Automatic = Symbicort 80 Take 2 puffs first thing in am and then another 2 puffs about 12 hours later.    Plan B = Backup Only use your albuterol (ventolin) inhaler as a rescue medication to be used if you can't catch your breath by resting or doing a relaxed purse lip breathing pattern.  - The less you use it, the better it will work when you need it. - Ok to use the inhaler up to 2 puffs  every 4 hours if you must but call for appointment if use goes up over your usual need - Don't leave home without it !!  (think of it like the spare tire for your car)   As needed for cough > mucinex dm up to 1200 mg every 12 hours and supplement with tramadol if needed    Please schedule a follow up visit in 3 months but call sooner if needed  with all medications /inhalers/ solutions in hand so we can verify exactly what you are taking. This includes all medications from all doctors and over the counters  - pfts on return with no inhalers that day if possible

## 2019-01-12 NOTE — Telephone Encounter (Signed)
LMTCB x1 for pt.  

## 2019-01-13 ENCOUNTER — Telehealth: Payer: Self-pay | Admitting: Internal Medicine

## 2019-01-13 MED ORDER — TRAMADOL HCL 50 MG PO TABS: 50.0000 mg | ORAL_TABLET | ORAL | 0 refills | Status: DC | PRN

## 2019-01-13 NOTE — Telephone Encounter (Signed)
Pt is at pharm waiting for Rx for Tramadol. Pharm does not have Rx. Cb is [phone]

## 2019-01-13 NOTE — Telephone Encounter (Signed)
Called and spoke to pt. Informed her of the recs per MW. Rx called into preferred pharmacy. Spoke with Sharl Ma and her PFT will need to be moved but Sharl Ma will call her with the new date. Pt is aware of this. Nothing further needed at this time. Will sign off.

## 2019-01-13 NOTE — Telephone Encounter (Signed)
Canceled PFT per Dr. Melvyn Novas -pr

## 2019-01-25 ENCOUNTER — Ambulatory Visit: Payer: Medicare Other | Admitting: Internal Medicine

## 2019-03-15 ENCOUNTER — Other Ambulatory Visit: Payer: Self-pay

## 2019-03-15 DIAGNOSIS — J449 Chronic obstructive pulmonary disease, unspecified: Secondary | ICD-10-CM

## 2019-03-15 MED ORDER — SPIRIVA RESPIMAT 2.5 MCG/ACT IN AERS: 2.0000 | INHALATION_SPRAY | Freq: Every day | RESPIRATORY_TRACT | 5 refills | Status: DC

## 2019-03-15 NOTE — Telephone Encounter (Signed)
Spoke with the pt  I have refilled her spiriva respimat  She is overdue appt with MW with PFT  Order placed and will forward to [facility] to schedule her for the next available rov with pft with MW  Thanks

## 2019-03-18 NOTE — Telephone Encounter (Signed)
L/m on pt vm to call back to schedule pft -pr  °

## 2019-03-23 ENCOUNTER — Other Ambulatory Visit: Payer: Self-pay | Admitting: Internal Medicine

## 2019-03-23 NOTE — Telephone Encounter (Signed)
Called spoke with patient has been scheduled for pft and follow up with Dr. Melvyn Novas, and covid screen scheduled. Patient easier to quarantine over weekend so accommodated patient. Best I could. Nothing further needed

## 2019-04-13 ENCOUNTER — Telehealth: Payer: Self-pay | Admitting: Internal Medicine

## 2019-04-13 NOTE — Telephone Encounter (Signed)
LMTCB, will route message to nurse to hold to f/u. Will  Hold in triage until we update patient.   PA submitted via cover my meds.   Key: [id number]  Your information has been submitted to [place] [employer] Part D. [employer] will review the request and will issue a decision, typically within 1-3 days from your submission. You can check the updated outcome later by reopening this request.  If [employer] has not responded in 1-3 days or if you have any questions about your ePA request, please contact [place] Medicare Part D at [phone]. If you think there may be a problem with your PA request, use our live chat feature at the bottom right.

## 2019-04-14 NOTE — Telephone Encounter (Signed)
Want to know if Melvyn Novas has sent the form back into the pharmacy  She is out of the [place]  Do we have any samples if so let her know so she can come and pick them up

## 2019-04-14 NOTE — Telephone Encounter (Signed)
Called [facility] and confirmed they received fax from patient insurance approving Spiriva. They are getting the prescription ready for pick up.  I called the patient to make her aware. Nothing further needed at this time.

## 2019-04-14 NOTE — Telephone Encounter (Signed)
   Dorann Spease (Key: [id number])  This request has received a Favorable outcome.  Please note any additional information provided by [employer] at the bottom of this request.

## 2019-04-15 ENCOUNTER — Other Ambulatory Visit (HOSPITAL_COMMUNITY)
Admission: RE | Admit: 2019-04-15 | Discharge: 2019-04-15 | Disposition: A | Discharge status: Home / Self Care | Payer: Medicare HMO | Source: Ambulatory Visit | Attending: Internal Medicine | Admitting: Internal Medicine

## 2019-04-15 DIAGNOSIS — Z20828 Contact with and (suspected) exposure to other viral communicable diseases: Secondary | ICD-10-CM | POA: Insufficient documentation

## 2019-04-15 DIAGNOSIS — Z01812 Encounter for preprocedural laboratory examination: Secondary | ICD-10-CM | POA: Diagnosis present

## 2019-04-18 LAB — NOVEL CORONAVIRUS, NAA (HOSP ORDER, SEND-OUT TO REF LAB; TAT 18-24 HRS): SARS-CoV-2, NAA: NOT DETECTED

## 2019-04-18 NOTE — Progress Notes (Signed)
mychart note sent

## 2019-04-19 ENCOUNTER — Ambulatory Visit: Payer: Medicare Other | Admitting: Internal Medicine

## 2019-05-03 ENCOUNTER — Telehealth: Payer: Self-pay | Admitting: Internal Medicine

## 2019-05-03 ENCOUNTER — Other Ambulatory Visit: Payer: Self-pay | Admitting: Internal Medicine

## 2019-05-03 NOTE — Telephone Encounter (Signed)
Routing to front office pool - it looks like patient was called to [facility] 11/27 PFT

## 2019-05-05 ENCOUNTER — Telehealth: Payer: Self-pay | Admitting: Internal Medicine

## 2019-05-05 DIAGNOSIS — R05 Cough: Secondary | ICD-10-CM

## 2019-05-05 DIAGNOSIS — J449 Chronic obstructive pulmonary disease, unspecified: Secondary | ICD-10-CM

## 2019-05-05 DIAGNOSIS — R058 Other specified cough: Secondary | ICD-10-CM

## 2019-05-05 MED ORDER — TRAMADOL HCL 50 MG PO TABS: 50.0000 mg | ORAL_TABLET | ORAL | 0 refills | Status: DC | PRN

## 2019-05-05 MED FILL — traMADol HCL 50 MG TABS: 50 | 5 days supply | Qty: 30 | Fill #0

## 2019-05-05 NOTE — Telephone Encounter (Signed)
Nothing further needed 

## 2019-05-05 NOTE — Telephone Encounter (Signed)
I know I already sent it in

## 2019-05-05 NOTE — Telephone Encounter (Signed)
Dr. Melvyn Novas, this patient was last seen by you 10/22/18 dx COPD Gold II and UACS. The tramadol 50 mg was last issued 01/13/19 with a 40 dispense.   The patient has been scheduled for her PFT on 06/03/19 and office visit with you on 06/06/19.  Below are notes from the visit:  Cough onset around 2000  Dg Es 05/18/15  Pos GERD  Recurrent from 08/2017 to 03/12/2018  - FENO 03/12/2018  =   7  - trial of gabapentin 100 tid 03/12/2018 > improved 05/10/2018   Says still taking gabapentin / gerd rx so try to control cough with symb 80 2bid and restrict work environment to avoid hot /dusty/combustion exhaust exp and not better then return with all meds in hand using a trust but verify approach to confirm accurate Medication  Reconciliation The principal here is that until we are certain that the  patients are doing what we've asked, it makes no sense to ask them to do more.   I had an extended discussion with the patient reviewing all relevant studies completed to date and  lasting 15 to 20 minutes of a 25 minute visit    See device teaching which extended face to face time for this visit.  Each maintenance medication was reviewed in detail including emphasizing most importantly the difference between maintenance and prns and under what circumstances the prns are to be triggered using an action plan format that is not reflected in the computer generated alphabetically organized AVS which I have not found useful in most complex patients, especially with respiratory illnesses  Please see AVS for specific instructions unique to this visit that I personally wrote and verbalized to the the pt in detail and then reviewed with pt  by my nurse highlighting any  changes in therapy recommended at today's visit to their plan of care.    You will not be able to work in an environment that is dusty or  Hot or has any combustion exhaust exposure - needs to be clean and climate controlled in a sitting position.  Plan A =  Automatic = Symbicort 80 Take 2 puffs first thing in am and then another 2 puffs about 12 hours later.    Plan B = Backup Only use your albuterol (ventolin) inhaler as a rescue medication to be used if you can't catch your breath by resting or doing a relaxed purse lip breathing pattern.  - The less you use it, the better it will work when you need it. - Ok to use the inhaler up to 2 puffs  every 4 hours if you must but call for appointment if use goes up over your usual need - Don't leave home without it !!  (think of it like the spare tire for your car)   As needed for cough > mucinex dm up to 1200 mg every 12 hours and supplement with tramadol if needed   Please schedule a follow up visit in 3 months but call sooner if needed  with all medications /inhalers/ solutions in hand so we can verify exactly what you are taking. This includes all medications from all doctors and over the counters  - pfts on return with no inhalers that day if possible   Please advise if the patient can have refill of Tramadol sent to pharmacy. Thank you.

## 2019-05-05 NOTE — Telephone Encounter (Signed)
Will do x one refill but needs to be sure keeps f/u appt with all meds in hand to regroup

## 2019-05-05 NOTE — Telephone Encounter (Signed)
Dr. Melvyn Novas, this is a controlled medication. The system will not allow me to send it to the pharmacy. It has been added to this as a "unsigned order" that needs your approval.  Called the patient and advised prescription would be sent to pharmacy today.

## 2019-05-06 ENCOUNTER — Other Ambulatory Visit: Payer: Self-pay

## 2019-05-06 MED ORDER — GABAPENTIN 100 MG PO CAPS: 100.0000 mg | ORAL_CAPSULE | Freq: Three times a day (TID) | ORAL | 2 refills | Status: DC

## 2019-05-06 NOTE — Telephone Encounter (Signed)
Pt called stated that she still hasn't received her Gabapentin and Tramadol refill.

## 2019-05-06 NOTE — Telephone Encounter (Signed)
This medication was already sent in to the pharmacy on 05/05/19 by Dr. Melvyn Novas. Nothing further needed at this time.

## 2019-05-06 NOTE — Telephone Encounter (Signed)
Spoke with pt, she states her Gabapentin wasn't sent in. I sent [facility] [place]. Nothing further is needed.

## 2019-05-12 NOTE — Telephone Encounter (Signed)
Patient rescheduled for ov with Dr. Melvyn Novas to 06/06/2019 @ 2:30pm -pr

## 2019-05-18 ENCOUNTER — Telehealth: Payer: Self-pay | Admitting: Internal Medicine

## 2019-05-18 MED FILL — traMADol HCL 50 MG TABS: 50 | 5 days supply | Qty: 30 | Fill #0

## 2019-05-18 NOTE — Telephone Encounter (Signed)
Patient called back advised that rx was sent to [place] - pt will go there to pick up -nothing further needed -pr

## 2019-05-18 NOTE — Telephone Encounter (Signed)
LMTCB x1 for pt. Rx was last refilled on 05/05/2019 and was sent to [facility].

## 2019-06-03 ENCOUNTER — Ambulatory Visit: Payer: Medicare HMO | Admitting: Internal Medicine

## 2019-06-06 ENCOUNTER — Ambulatory Visit: Payer: Medicare HMO | Admitting: Internal Medicine

## 2019-08-12 ENCOUNTER — Telehealth: Payer: Self-pay | Admitting: Internal Medicine

## 2019-08-12 NOTE — Telephone Encounter (Signed)
We do have samples of mucinex that I have put up front for pt. Called and spoke with pt letting her know this info and stated to her that it was 600mg  that had two in a pack that would equal 1200mg . Pt verbalized understanding and stated she would come by office to pick samples up. Nothing further needed.

## 2019-08-12 NOTE — Telephone Encounter (Signed)
Pt calling to see if she could get some samples of the mucinex dm 1200mg  that Dr. Melvyn Novas prescribed. Pt stated that her pharmacy would complete the refill because the medicine is otc medicine. Pt can be reached at [phone].

## 2019-10-03 ENCOUNTER — Telehealth: Payer: Self-pay | Admitting: Internal Medicine

## 2019-10-03 NOTE — Telephone Encounter (Signed)
lmtcb for pt. Pt needs OV.  °

## 2019-10-04 MED ORDER — SPIRIVA RESPIMAT 2.5 MCG/ACT IN AERS: 2.0000 | INHALATION_SPRAY | Freq: Every day | RESPIRATORY_TRACT | 3 refills | Status: DC

## 2019-10-04 NOTE — Telephone Encounter (Signed)
Pt called back-- pt has OV scheduled 4/22-- pt is out of inhaler completely and would like the refill before 4/22

## 2019-10-04 NOTE — Telephone Encounter (Signed)
Called and spoke to pt. Pt requesting Spiriva refill. Rx sent to preferred pharmacy. Pt aware to keep upcoming appt. Pt verbalized understanding and denied any further questions or concerns at this time.

## 2019-10-14 ENCOUNTER — Telehealth: Payer: Self-pay | Admitting: Internal Medicine

## 2019-10-14 NOTE — Telephone Encounter (Signed)
Called and spoke with Patient.  Patient is requested refills for Tramadol and gabapentin to be sent to [facility]. Patient scheduled 10/27/19 for OV.  LOV was 10/22/18 with Dr Melvyn Novas      Instructions  You will not be able to work in an environment that is dusty or  Hot or has any combustion exhaust exposure - needs to be clean and climate controlled in a sitting position.  Plan A = Automatic = Symbicort 80 Take 2 puffs first thing in am and then another 2 puffs about 12 hours later.    Plan B = Backup Only use your albuterol (ventolin) inhaler as a rescue medication to be used if you can't catch your breath by resting or doing a relaxed purse lip breathing pattern.  - The less you use it, the better it will work when you need it. - Ok to use the inhaler up to 2 puffs  every 4 hours if you must but call for appointment if use goes up over your usual need - Don't leave home without it !!  (think of it like the spare tire for your car)   As needed for cough > mucinex dm up to 1200 mg every 12 hours and supplement with tramadol if needed    Please schedule a follow up visit in 3 months but call sooner if needed  with all medications /inhalers/ solutions in hand so we can verify exactly what you are taking. This includes all medications from all doctors and over the counters  - pfts on return with no inhalers that day if possible

## 2019-10-15 NOTE — Telephone Encounter (Signed)
Ok for gabapentin but not tramadol w/o ov with all meds in hand - in meantime strongest otc is delsym 2 tsp bid prn

## 2019-10-17 MED ORDER — GABAPENTIN 100 MG PO CAPS: 100.0000 mg | ORAL_CAPSULE | Freq: Three times a day (TID) | ORAL | 0 refills | Status: DC

## 2019-10-17 NOTE — Telephone Encounter (Signed)
Called pt and advised message from the provider. Pt understood and verbalized understanding. Nothing further is needed.   Rx for Gabapentin sent in and appt is on 10/27/2019 with MW.

## 2019-10-17 NOTE — Telephone Encounter (Signed)
Patient checking status of medication refills. Patient phone number is [phone].

## 2019-10-24 ENCOUNTER — Other Ambulatory Visit (HOSPITAL_COMMUNITY): Admission: RE | Admit: 2019-10-24 | Payer: Medicare HMO | Source: Ambulatory Visit

## 2019-10-24 NOTE — Progress Notes (Signed)
Pt arrived to appointment for COVID-19 testing and then refused to be swabbed after being told it was a nasopharyngeal test. Pt stated "I've had other covid tests that didn't go back far and I am not hurting myself just to have this procedure." Swabber advised the pt to call the office to inform them that she is refusing testing Pt is aware that she may not have her procedure without covid testing. Appointment has now been cancelled.   Jacqlyn Larsen, RN

## 2019-10-27 ENCOUNTER — Ambulatory Visit: Payer: Medicare HMO | Admitting: Internal Medicine

## 2019-11-18 ENCOUNTER — Other Ambulatory Visit: Payer: Self-pay | Admitting: Internal Medicine

## 2019-12-01 DIAGNOSIS — R7303 Prediabetes: Secondary | ICD-10-CM | POA: Insufficient documentation

## 2019-12-06 ENCOUNTER — Telehealth: Payer: Self-pay | Admitting: Internal Medicine

## 2019-12-06 MED ORDER — GABAPENTIN 100 MG PO CAPS: 100.0000 mg | ORAL_CAPSULE | Freq: Three times a day (TID) | ORAL | 0 refills | Status: DC

## 2019-12-06 NOTE — Telephone Encounter (Signed)
Spoke with the pt  She did not want to schedule the PFT b/c of [facility]'s requred covid test  She does want to see APP  I scheduled her with TP for 12/16/19 at 4:30 and gabapentin refilled x 1 only

## 2019-12-06 NOTE — Telephone Encounter (Signed)
Ok to do pft thru our standard protocol and f/u with NP if I have nothing in next 6 weeks but ok to refill gagapentin until her appt at least

## 2019-12-06 NOTE — Telephone Encounter (Signed)
Spoke with pt, she is wanting to see if she could have her pulmonary function test since she was just tested at her primary care. I advised her that we didn't have opening available that soon and she would have to make sure it is a PCR test.   She is requesting a refill on Gabapentin but she hasn't been seen since 10/2018. I advised her that she would have to make an office visit for follow up. Pt understood but MW doesn't have any appts available. Please advise if we can refill and I have to put her with a NP for follow up.

## 2019-12-16 ENCOUNTER — Ambulatory Visit (INDEPENDENT_AMBULATORY_CARE_PROVIDER_SITE_OTHER): Payer: Medicare HMO | Admitting: Adult Health

## 2019-12-16 ENCOUNTER — Encounter: Payer: Self-pay | Admitting: Adult Health

## 2019-12-16 ENCOUNTER — Other Ambulatory Visit: Payer: Self-pay

## 2019-12-16 VITALS — BP 114/72 | HR 101 | Temp 97.6°F | Ht 60.0 in | Wt 157.6 lb

## 2019-12-16 DIAGNOSIS — J449 Chronic obstructive pulmonary disease, unspecified: Secondary | ICD-10-CM | POA: Diagnosis not present

## 2019-12-16 DIAGNOSIS — R05 Cough: Secondary | ICD-10-CM | POA: Diagnosis not present

## 2019-12-16 DIAGNOSIS — R058 Other specified cough: Secondary | ICD-10-CM

## 2019-12-16 MED ORDER — TRAMADOL HCL 50 MG PO TABS: 50.0000 mg | ORAL_TABLET | Freq: Four times a day (QID) | ORAL | 0 refills | Status: DC | PRN

## 2019-12-16 NOTE — Progress Notes (Signed)
_0  ID: Brittany Sparks, female    DOB: 10/31/1965, 54 y.o.   MRN: [id number]  Chief Complaint  Patient presents with   Follow-up    COPD     Referring provider: Vicenta Aly, FNP  HPI: 54 year old female former smoker followed for moderate COPD  TEST/EVENTS :  PFTs 07/29/2012 FEV1 1.39 (61%)  Ratio 60 and no better p B2  DLCO 55%  > 114%  12/16/2019 Follow-up COPD and Chronic cough  Patient present for a follow up for COPD . Last seen 10/2018 . Says she had been doing okay until last last few weeks when dry hacking cough has started to increase.  Previously on Gabapentin which helped some , so recently Restarted gabapentin using it twice a day.  Cannot tolerate 3 times a day causes too much sedation. In past Tramadol has helped requests refill . Discussion regarding narcotic usage with patient education.  Denies fever, chest pain, orthopnea or edema, no discolored mucus ,loss of taste or smell. Remains on Symbicort Twice daily  And Spiriva daily . No increased albuterol use.    No Known Allergies  Immunization History  Administered Date(s) Administered   Influenza Split 05/14/2011, 04/01/2012   Influenza Whole 06/23/2011, 04/12/2012   Influenza, Quadrivalent, Recombinant, Inj, Pf 07/28/2017   Influenza,trivalent, recombinat, inj, PF 05/14/2011, 04/01/2012   Pneumococcal Polysaccharide-23 04/07/2011   Pneumococcal-Unspecified 05/14/2011   Tdap 05/14/2011    Past Medical History:  Diagnosis Date   Asthma    Constipation    COPD (chronic obstructive pulmonary disease) ([facility])    Drug addiction ([facility])    GERD (gastroesophageal reflux disease)    Hepatitis C    Herpes simplex type 2 infection    Hyperlipidemia    Hypertension    Recurrent mouth ulceration    SBO (small bowel obstruction) ([place])    2014    Tobacco History: Social History   Tobacco Use  Smoking Status Former Smoker   Packs/day: 0.50   Years: 30.00   Pack years: 15.00    Types: Cigarettes   Quit date: 11/05/2006   Years since quitting: 13.1  Smokeless Tobacco Never Used   Counseling given: Not Answered   Outpatient Medications Prior to Visit  Medication Sig Dispense Refill   amLODipine (NORVASC) 5 MG tablet Take 5 mg by mouth daily.     budesonide-formoterol (SYMBICORT) 80-4.5 MCG/ACT inhaler Inhale 2 puffs into the lungs 2 (two) times daily. 1 Inhaler 11   famotidine (PEPCID) 20 MG tablet One at bedtime 30 tablet 11   gabapentin (NEURONTIN) 100 MG capsule Take 1 capsule (100 mg total) by mouth 3 (three) times daily. 90 capsule 0   Guaifenesin 1200 MG TB12 Take by mouth.     ID NOW COVID-19 KIT See admin instructions.     methylPREDNISolone (MEDROL DOSEPAK) 4 MG TBPK tablet See admin instructions.     olmesartan (BENICAR) 20 MG tablet Take 20 mg by mouth daily.     pantoprazole (PROTONIX) 40 MG tablet TAKE 1 TABLET(40 MG) BY MOUTH DAILY 30 TO 60 MINUTES BEFORE FIRST MEAL OF THE DAY 30 tablet 2   phentermine (ADIPEX-P) 37.5 MG tablet Take 18.75 mg by mouth every morning.     simvastatin (ZOCOR) 10 MG tablet Take 10 mg by mouth at bedtime.     Tiotropium Bromide Monohydrate (SPIRIVA RESPIMAT) 2.5 MCG/ACT AERS Inhale 2 puffs into the lungs daily. 12 g 3   valACYclovir (VALTREX) 1000 MG tablet Take 1,000 mg by mouth 2 (two) times  daily.     budesonide-formoterol (SYMBICORT) 160-4.5 MCG/ACT inhaler INHALE 2 PUFFS INTO THE LUNGS TWICE DAILY 10.2 g 2   traMADol (ULTRAM) 50 MG tablet Take 1 tablet (50 mg total) by mouth every 4 (four) hours as needed. 30 tablet 0   No facility-administered medications prior to visit.     Review of Systems:   Constitutional:   No  weight loss, night sweats,  Fevers, chills, + fatigue, or  lassitude.  HEENT:   No headaches,  Difficulty swallowing,  Tooth/dental problems, or  Sore throat,                No sneezing, itching, ear ache, nasal congestion, post nasal drip,   CV:  No chest pain,  Orthopnea, PND,  swelling in lower extremities, anasarca, dizziness, palpitations, syncope.   GI  No heartburn, indigestion, abdominal pain, nausea, vomiting, diarrhea, change in bowel habits, loss of appetite, bloody stools.   Resp:    No chest wall deformity  Skin: no rash or lesions.  GU: no dysuria, change in color of urine, no urgency or frequency.  No flank pain, no hematuria   MS:  No joint pain or swelling.  No decreased range of motion.  No back pain.    Physical Exam  BP 114/72 (BP Location: Left Arm, Cuff Size: Normal)    Pulse (!) 101    Temp 97.6 F (36.4 C) (Oral)    Ht 5' (1.524 m)    Wt 157 lb 9.6 oz (71.5 kg)    SpO2 96%    BMI 30.78 kg/m   GEN: A/Ox3; pleasant , NAD, well nourished    HEENT:  /AT,    NOSE-clear, THROAT-clear, no lesions, no postnasal drip or exudate noted.   NECK:  Supple w/ fair ROM; no JVD; normal carotid impulses w/o bruits; no thyromegaly or nodules palpated; no lymphadenopathy.    RESP  Clear  P & A; w/o, wheezes/ rales/ or rhonchi. no accessory muscle use, no dullness to percussion  CARD:  RRR, no m/r/g, no peripheral edema, pulses intact, no cyanosis or clubbing.  GI:   Soft & nt; nml bowel sounds; no organomegaly or masses detected.   Musco: Warm bil, no deformities or joint swelling noted.   Neuro: alert, no focal deficits noted.    Skin: Warm, no lesions or rashes    Lab Results:  CBC   BNP No results found for: BNP  ProBNP  Imaging: No results found.    No flowsheet data found.  Lab Results  Component Value Date   NITRICOXIDE 7 03/12/2018        Assessment & Plan:   No problem-specific Assessment & Plan notes found for this encounter.     Rexene Edison, NP 12/16/2019

## 2019-12-16 NOTE — Patient Instructions (Addendum)
Continue on Symbicort 2 puffs Twice daily , rinse after use.  Continue on Spiriva 2 puffs daily  Continue on Gabapentin Twice daily   May use Delsym 2 tsp Twice daily  For cough As needed   May use Tramadol 50mg  As needed for severe cough , use with caution as sedating and addicting.  Chest xray today  Follow up with Dr. Melvyn Novas  In 2-3 months and As needed

## 2019-12-19 NOTE — Assessment & Plan Note (Signed)
Continue on cough control with gabapentin for now  Check chest xray .  Delsym and tramadol (PMP reviewed and 1 x only tramadol refill)   Plan  Patient Instructions  Continue on Symbicort 2 puffs Twice daily , rinse after use.  Continue on Spiriva 2 puffs daily  Continue on Gabapentin Twice daily   May use Delsym 2 tsp Twice daily  For cough As needed   May use Tramadol 50mg  As needed for severe cough , use with caution as sedating and addicting.  Chest xray today  Follow up with Dr. Melvyn Novas  In 2-3 months and As needed

## 2019-12-19 NOTE — Assessment & Plan Note (Signed)
Mild flare with increased chronic cough  Check chest xray .  Continue on current regimen , hold on steroids at this time  Add cough control regimen to help with chronic cough . PMP reviewed , 1 time refil of tramadol with narcotic patient education   Plan  Patient Instructions  Continue on Symbicort 2 puffs Twice daily , rinse after use.  Continue on Spiriva 2 puffs daily  Continue on Gabapentin Twice daily   May use Delsym 2 tsp Twice daily  For cough As needed   May use Tramadol 50mg  As needed for severe cough , use with caution as sedating and addicting.  Chest xray today  Follow up with Dr. Melvyn Novas  In 2-3 months and As needed

## 2019-12-23 ENCOUNTER — Ambulatory Visit (INDEPENDENT_AMBULATORY_CARE_PROVIDER_SITE_OTHER)
Admission: RE | Admit: 2019-12-23 | Discharge: 2019-12-23 | Disposition: A | Discharge status: Home / Self Care | Payer: Medicare HMO | Source: Ambulatory Visit | Attending: Adult Health | Admitting: Adult Health

## 2019-12-23 ENCOUNTER — Other Ambulatory Visit: Payer: Self-pay

## 2019-12-23 DIAGNOSIS — J449 Chronic obstructive pulmonary disease, unspecified: Secondary | ICD-10-CM | POA: Diagnosis not present

## 2020-01-05 ENCOUNTER — Other Ambulatory Visit: Payer: Self-pay

## 2020-01-05 MED ORDER — GABAPENTIN 100 MG PO CAPS: 100.0000 mg | ORAL_CAPSULE | Freq: Two times a day (BID) | ORAL | 2 refills | Status: DC

## 2020-03-05 ENCOUNTER — Ambulatory Visit: Payer: Medicare HMO | Admitting: Internal Medicine

## 2020-04-11 ENCOUNTER — Emergency Department (HOSPITAL_COMMUNITY): Payer: Medicare HMO

## 2020-04-11 ENCOUNTER — Emergency Department (HOSPITAL_COMMUNITY)
Admission: EM | Admit: 2020-04-11 | Discharge: 2020-04-11 | Disposition: A | Discharge status: Home / Self Care | Payer: Medicare HMO | Attending: Emergency Medicine | Admitting: Emergency Medicine

## 2020-04-11 ENCOUNTER — Other Ambulatory Visit: Payer: Self-pay | Admitting: Internal Medicine

## 2020-04-11 ENCOUNTER — Other Ambulatory Visit: Payer: Self-pay

## 2020-04-11 DIAGNOSIS — R058 Other specified cough: Secondary | ICD-10-CM

## 2020-04-11 DIAGNOSIS — I10 Essential (primary) hypertension: Secondary | ICD-10-CM | POA: Insufficient documentation

## 2020-04-11 DIAGNOSIS — R1084 Generalized abdominal pain: Secondary | ICD-10-CM | POA: Insufficient documentation

## 2020-04-11 DIAGNOSIS — Z7951 Long term (current) use of inhaled steroids: Secondary | ICD-10-CM | POA: Insufficient documentation

## 2020-04-11 DIAGNOSIS — R0789 Other chest pain: Secondary | ICD-10-CM

## 2020-04-11 DIAGNOSIS — Z79899 Other long term (current) drug therapy: Secondary | ICD-10-CM | POA: Diagnosis not present

## 2020-04-11 DIAGNOSIS — M549 Dorsalgia, unspecified: Secondary | ICD-10-CM | POA: Diagnosis not present

## 2020-04-11 DIAGNOSIS — Z87891 Personal history of nicotine dependence: Secondary | ICD-10-CM | POA: Insufficient documentation

## 2020-04-11 DIAGNOSIS — J45909 Unspecified asthma, uncomplicated: Secondary | ICD-10-CM | POA: Insufficient documentation

## 2020-04-11 DIAGNOSIS — K209 Esophagitis, unspecified without bleeding: Secondary | ICD-10-CM | POA: Insufficient documentation

## 2020-04-11 LAB — CBC WITH DIFFERENTIAL/PLATELET
Abs Immature Granulocytes: 0.01 10*3/uL (ref 0.00–0.07)
Basophils Absolute: 0 10*3/uL (ref 0.0–0.1)
Basophils Relative: 0 %
Eosinophils Absolute: 0.1 10*3/uL (ref 0.0–0.5)
Eosinophils Relative: 1 %
HCT: 37.1 % (ref 36.0–46.0)
Hemoglobin: 11.9 g/dL — ABNORMAL LOW (ref 12.0–15.0)
Immature Granulocytes: 0 %
Lymphocytes Relative: 34 %
Lymphs Abs: 2.3 10*3/uL (ref 0.7–4.0)
MCH: 31.2 pg (ref 26.0–34.0)
MCHC: 32.1 g/dL (ref 30.0–36.0)
MCV: 97.4 fL (ref 80.0–100.0)
Monocytes Absolute: 0.6 10*3/uL (ref 0.1–1.0)
Monocytes Relative: 9 %
Neutro Abs: 3.8 10*3/uL (ref 1.7–7.7)
Neutrophils Relative %: 56 %
Platelets: 341 10*3/uL (ref 150–400)
RBC: 3.81 MIL/uL — ABNORMAL LOW (ref 3.87–5.11)
RDW: 13.1 % (ref 11.5–15.5)
WBC: 6.9 10*3/uL (ref 4.0–10.5)
nRBC: 0 % (ref 0.0–0.2)

## 2020-04-11 LAB — COMPREHENSIVE METABOLIC PANEL
ALT: 13 U/L (ref 0–44)
AST: 18 U/L (ref 15–41)
Albumin: 4.6 g/dL (ref 3.5–5.0)
Alkaline Phosphatase: 65 U/L (ref 38–126)
Anion gap: 11 (ref 5–15)
BUN: 14 mg/dL (ref 6–20)
CO2: 26 mmol/L (ref 22–32)
Calcium: 9.9 mg/dL (ref 8.9–10.3)
Chloride: 104 mmol/L (ref 98–111)
Creatinine, Ser: 0.81 mg/dL (ref 0.44–1.00)
GFR calc non Af Amer: >60 mL/min (ref >60)
Glucose, Bld: 100 mg/dL — ABNORMAL HIGH (ref 70–99)
Potassium: 3.7 mmol/L (ref 3.5–5.1)
Sodium: 141 mmol/L (ref 135–145)
Total Bilirubin: 0.5 mg/dL (ref 0.3–1.2)
Total Protein: 8.3 g/dL — ABNORMAL HIGH (ref 6.5–8.1)

## 2020-04-11 LAB — URINALYSIS, ROUTINE W REFLEX MICROSCOPIC
Bilirubin Urine: NEGATIVE
Glucose, UA: NEGATIVE mg/dL
Hgb urine dipstick: NEGATIVE
Ketones, ur: NEGATIVE mg/dL
Leukocytes,Ua: NEGATIVE
Nitrite: NEGATIVE
Protein, ur: NEGATIVE mg/dL
Specific Gravity, Urine: 1.009 (ref 1.005–1.030)
pH: 6 (ref 5.0–8.0)

## 2020-04-11 LAB — RAPID URINE DRUG SCREEN, HOSP PERFORMED
Amphetamines: NOT DETECTED
Barbiturates: NOT DETECTED
Benzodiazepines: NOT DETECTED
Cocaine: NOT DETECTED
Opiates: NOT DETECTED
Tetrahydrocannabinol: NOT DETECTED

## 2020-04-11 LAB — LIPASE, BLOOD: Lipase: 34 U/L (ref 11–51)

## 2020-04-11 LAB — TROPONIN I (HIGH SENSITIVITY): Troponin I (High Sensitivity): 2 ng/L (ref <18)

## 2020-04-11 MED ORDER — SODIUM CHLORIDE 0.9 % IV BOLUS
1000.0000 mL | Freq: Once | INTRAVENOUS | Status: AC
  Administered 2020-04-11: 1000 mL via INTRAVENOUS

## 2020-04-11 MED ORDER — IOHEXOL 300 MG/ML  SOLN
100.0000 mL | Freq: Once | INTRAMUSCULAR | Status: AC | PRN
  Administered 2020-04-11: 100 mL via INTRAVENOUS

## 2020-04-11 MED ORDER — CYCLOBENZAPRINE HCL 10 MG PO TABS
5.0000 mg | ORAL_TABLET | Freq: Once | ORAL | Status: AC
  Administered 2020-04-11: 5 mg via ORAL
  Filled 2020-04-11: qty 1

## 2020-04-11 MED ORDER — CYCLOBENZAPRINE HCL 5 MG PO TABS: 5.0000 mg | ORAL_TABLET | Freq: Three times a day (TID) | ORAL | 0 refills | Status: AC | PRN

## 2020-04-11 MED ORDER — PANTOPRAZOLE SODIUM 40 MG PO TBEC
40.0000 mg | DELAYED_RELEASE_TABLET | Freq: Once | ORAL | Status: AC
  Administered 2020-04-11: 40 mg via ORAL
  Filled 2020-04-11: qty 1

## 2020-04-11 NOTE — ED Notes (Signed)
An After Visit Summary was printed and given to the patient. Discharge instructions given and no further questions at this time.  

## 2020-04-11 NOTE — ED Provider Notes (Signed)
[facility] DEPT Provider Note   CSN: [id number] Arrival date & time: 04/11/20  1823     History Chief Complaint  Patient presents with  . Chest Pain    Brittany Sparks is a 54 y.o. female with past medical history of drug abuse, hypertension, asthma, small bowel obstruction, GERD, who presents to the ED for chief complaint of chest pain.  Patient states that her chest pain started this morning.  The pain did not wake her up from sleep.  Pain is located at sternum.  Described pain as burning then aching and dull.  Pain is constant.  Rates 5 out of 10.  Nothing makes it better or worse.  Later this afternoon she developed right lower ribs pain and also back pain.  Patient states that she has hiatal hernia and GERD so she always feel nauseous.  Patient thinks that her heartburn is not related to her chest pain.  Patient reports history of hospitalized for chest pain and was discharged home with nitroglycerin.  Patient say that no issue with her heart was found at that time.  Patient has not seen a cardiologist.  She denies vomiting, diarrhea, headache, recent travel, sick contact.  Patient has quit smoking and drug use 13 years ago.  Patient is not drinking alcohol.  HPI     Past Medical History:  Diagnosis Date  . Asthma   . Constipation   . COPD (chronic obstructive pulmonary disease) ([place])   . Drug addiction ([place])   . GERD (gastroesophageal reflux disease)   . Hepatitis C   . Herpes simplex type 2 infection   . Hyperlipidemia   . Hypertension   . Recurrent mouth ulceration   . SBO (small bowel obstruction) ([place])    2014    Patient Active Problem List   Diagnosis Date Noted  . Esophagitis 04/11/2020  . Generalized abdominal pain 04/11/2020  . Morbid obesity ([place]) 06/17/2015  . Shortness of breath 02/07/2013  . Atypical chest pain 02/07/2013  . Upper airway cough syndrome 04/26/2012  . HTN (hypertension) 02/22/2012  . COPD GOLD II 07/08/2011     Past Surgical History:  Procedure Laterality Date  . ABDOMINAL HYSTERECTOMY    . CESAREAN SECTION    . CHOLECYSTECTOMY    . HERNIA REPAIR       OB History   No obstetric history on file.     Family History  Problem Relation Age of Onset  . Emphysema Father   . Heart disease Father   . Clotting disorder Father     Social History   Tobacco Use  . Smoking status: Former Smoker    Packs/day: 0.50    Years: 30.00    Pack years: 15.00    Types: Cigarettes    Quit date: 11/05/2006    Years since quitting: 13.4  . Smokeless tobacco: Never Used  Substance Use Topics  . Alcohol use: No  . Drug use: No    Home Medications Prior to Admission medications   Medication Sig Start Date End Date Taking? Authorizing Provider  amLODipine (NORVASC) 5 MG tablet Take 5 mg by mouth daily.    [provider]  budesonide-formoterol (SYMBICORT) 80-4.5 MCG/ACT inhaler Inhale 2 puffs into the lungs 2 (two) times daily. 10/22/18   Tanda Rockers, MD  cyclobenzaprine (FLEXERIL) 5 MG tablet Take 1 tablet (5 mg total) by mouth 3 (three) times daily as needed for up to 5 days for muscle spasms. 04/11/20 04/16/20  Alfonse Spruce,  Alease Frame, DO  famotidine (PEPCID) 20 MG tablet One at bedtime 02/24/18   Tanda Rockers, MD  gabapentin (NEURONTIN) 100 MG capsule TAKE 1 CAPSULE(100 MG) BY MOUTH TWICE DAILY 04/11/20   Tanda Rockers, MD  Guaifenesin 1200 MG TB12 Take by mouth. 08/10/19   [provider]  ID NOW COVID-19 KIT See admin instructions. 12/01/19   [provider]  methylPREDNISolone (MEDROL DOSEPAK) 4 MG TBPK tablet See admin instructions. 12/02/19   [provider]  olmesartan (BENICAR) 20 MG tablet Take 20 mg by mouth daily.    [provider]  pantoprazole (PROTONIX) 40 MG tablet TAKE 1 TABLET(40 MG) BY MOUTH DAILY 30 TO 60 MINUTES BEFORE FIRST MEAL OF THE DAY 10/06/18   Tanda Rockers, MD  phentermine (ADIPEX-P) 37.5 MG tablet Take 18.75 mg by mouth every  morning. 09/30/19   [provider]  simvastatin (ZOCOR) 10 MG tablet Take 10 mg by mouth at bedtime.    [provider]  Tiotropium Bromide Monohydrate (SPIRIVA RESPIMAT) 2.5 MCG/ACT AERS Inhale 2 puffs into the lungs daily. 10/04/19   Tanda Rockers, MD  traMADol (ULTRAM) 50 MG tablet Take 1 tablet (50 mg total) by mouth every 6 (six) hours as needed. 12/16/19   Parrett, Fonnie Mu, NP  valACYclovir (VALTREX) 1000 MG tablet Take 1,000 mg by mouth 2 (two) times daily.    [provider]    Allergies    Patient has no known allergies.  Review of Systems   Review of Systems  Constitutional: Negative for diaphoresis and fever.  Respiratory: Positive for cough and chest tightness. Negative for shortness of breath.   Cardiovascular: Positive for chest pain. Negative for leg swelling.  Gastrointestinal: Positive for abdominal pain, constipation and nausea. Negative for vomiting.  Genitourinary: Negative for dysuria and flank pain.  Musculoskeletal: Positive for back pain.    Physical Exam Updated Vital Signs BP 131/74   Pulse 80   Temp 98.8 F (37.1 C) (Oral)   Resp 18   SpO2 97%   Physical Exam Constitutional:      General: She is not in acute distress. HENT:     Head: Normocephalic.  Eyes:     General:        Right eye: No discharge.        Left eye: No discharge.     Conjunctiva/sclera: Conjunctivae normal.  Cardiovascular:     Rate and Rhythm: Normal rate and regular rhythm.     Heart sounds: No murmur heard.   Pulmonary:     Effort: No respiratory distress.     Breath sounds: Normal breath sounds. No wheezing.  Abdominal:     General: Bowel sounds are normal. There is no distension.     Tenderness: There is abdominal tenderness. There is guarding.     Comments: Tenderness to palpation at right upper quadrant and left lower quadrant.  Also tenderness palpation epigastric region.  Musculoskeletal:        General: No tenderness.     Right lower  leg: No edema.     Left lower leg: No edema.  Skin:    General: Skin is warm.     Coloration: Skin is not jaundiced.  Neurological:     Mental Status: She is alert.  Psychiatric:        Mood and Affect: Mood normal.     ED Results / Procedures / Treatments   Labs (all labs ordered are listed, but only abnormal results  are displayed) Labs Reviewed  CBC WITH DIFFERENTIAL/PLATELET - Abnormal; Notable for the following components:      Result Value   RBC 3.81 (*)    Hemoglobin 11.9 (*)    All other components within normal limits  COMPREHENSIVE METABOLIC PANEL - Abnormal; Notable for the following components:   Glucose, Bld 100 (*)    Total Protein 8.3 (*)    All other components within normal limits  URINALYSIS, ROUTINE W REFLEX MICROSCOPIC - Abnormal; Notable for the following components:   Color, Urine COLORLESS (*)    All other components within normal limits  LIPASE, BLOOD  RAPID URINE DRUG SCREEN, HOSP PERFORMED  TROPONIN I (HIGH SENSITIVITY)  TROPONIN I (HIGH SENSITIVITY)    EKG EKG Interpretation  Date/Time:  Wednesday April 11 2020 19:22:50 EDT Ventricular Rate:  82 PR Interval:    QRS Duration: 91 QT Interval:  365 QTC Calculation: 427 R Axis:   46 Text Interpretation: Sinus rhythm Anteroseptal infarct, age indeterminate No significant change since last tracing Confirmed by Wandra Arthurs [phone]) on 04/11/2020 7:52:22 PM   Radiology DG Chest 2 View  Result Date: 04/11/2020 CLINICAL DATA:  Chest pain EXAM: CHEST - 2 VIEW COMPARISON:  12/23/2019 FINDINGS: The heart size and mediastinal contours are within normal limits. Both lungs are clear. The visualized skeletal structures are unremarkable. IMPRESSION: No active cardiopulmonary disease. Electronically Signed   By: Donavan Foil M.D.   On: 04/11/2020 20:10   CT ABDOMEN PELVIS W CONTRAST  Result Date: 04/11/2020 CLINICAL DATA:  Abdominal distension and pain EXAM: CT ABDOMEN AND PELVIS WITH CONTRAST TECHNIQUE:  Multidetector CT imaging of the abdomen and pelvis was performed using the standard protocol following bolus administration of intravenous contrast. CONTRAST:  131m OMNIPAQUE IOHEXOL 300 MG/ML  SOLN COMPARISON:  None. FINDINGS: Lower chest: The visualized heart size within normal limits. No pericardial fluid/thickening. There is a small hiatal hernia with question of mild wall thickening seen at the distal GE junction. Surgical clips are seen within the left upper quadrant. The visualized portions of the lungs are clear. Hepatobiliary: The liver is normal in density without focal abnormality.The main portal vein is patent. No evidence of calcified gallstones, gallbladder wall thickening or biliary dilatation. Pancreas: Unremarkable. No pancreatic ductal dilatation or surrounding inflammatory changes. Spleen: Normal in size without focal abnormality. Adrenals/Urinary Tract: Both adrenal glands appear normal. There is a 3 cm low-density lesion seen in the lower pole the left kidney. No renal or collecting system calculi. No hydronephrosis. Bladder is unremarkable. Stomach/Bowel: The stomach, small bowel, and colon are normal in appearance. There is a moderate amount of right colonic stool present. No inflammatory changes, wall thickening, or obstructive findings.The appendix is normal. Vascular/Lymphatic: There are no enlarged mesenteric, retroperitoneal, or pelvic lymph nodes. No significant vascular findings are present. Reproductive: The patient is status post hysterectomy. No adnexal masses or collections seen. Other: No evidence of abdominal wall mass or hernia. Musculoskeletal: No acute or significant osseous findings. IMPRESSION: Small hiatal hernia with mild wall thickening at the GE junction which could be due to mild esophagitis. Moderate amount of colonic stool without evidence of obstruction. Electronically Signed   By: BPrudencio PairM.D.   On: 04/11/2020 21:41    Procedures Procedures (including  critical care time)  Medications Ordered in ED Medications  pantoprazole (PROTONIX) EC tablet 40 mg (40 mg Oral Given 04/11/20 2027)  cyclobenzaprine (FLEXERIL) tablet 5 mg (5 mg Oral Given 04/11/20 2028)  sodium chloride 0.9 % bolus  1,000 mL (1,000 mLs Intravenous New Bag/Given 04/11/20 2035)  iohexol (OMNIPAQUE) 300 MG/ML solution 100 mL (100 mLs Intravenous Contrast Given 04/11/20 2126)    ED Course  I have reviewed the triage vital signs and the nursing notes.  Pertinent labs & imaging results that were available during my care of the patient were reviewed by me and considered in my medical decision making (see chart for details).  Patient seen and examined.  Patient reports chest pain that started this morning and has been constant.  EKG shows sinus rhythm with mild T wave inversion on V1 V2.  Obtain troponin, UDS, CBC, CMP and chest x-ray.  On physical exam patient is tender to palpation at the right upper quadrant and left lower quadrant.  Obtain lipase and CT abdomen pelvis.  Patient reports history of cholecystectomy.  Point-of-care ultrasound at bedside did not show dilated CBD.  1 dose of Flexeril and Protonix given at this time.  BP (!) 124/95 (BP Location: Left Arm)   Pulse 82   Temp 98.8 F (37.1 C) (Oral)   Resp 18   SpO2 95%   Lab work came back reassuring. Troponins negative. Lipase unremarkable. CT abdomen shows hiatal hernia and esophagitis. Her chest pain is likely secondary to esophagitis and heartburn. Her coughing can also exacerbate the pain. Patient is stable for discharge  Plan: Continue Protonix 40 mg daily. Prescribed Flexeril 5 mg 3 times daily for 5 days. Patient is instructed to follow-up with her PCP. Avoid NSAIDs. Come back to the ED for worsening chest pain or shortness of breath.   MDM Rules/Calculators/A&P                          Patient presents to the ED for atypical chest pain and diffuse abdominal pain. Lab work came back reassuring with negative  troponin and lipase. CT abdomen only reveals hiatal hernia and esophagitis. Patient does states that she has been taking Advil frequently. No obvious causes of her abdominal pain. Her chest pain is likely related to the esophagitis and GERD. Her coughing also contributes to the pain. Patient states that pain has resolved with the Flexeril. Patient will be discharged with 5 days of Flexeril. Also instructed to continue Protonix 40 mg daily. Patient is advised to avoid NSAIDs and take Tylenol for pain. Follow-up with PCP and possible referral to GI. Come back to the ED for worsening chest pain or shortness of breath.  Final Clinical Impression(s) / ED Diagnoses Final diagnoses:  Atypical chest pain  Esophagitis  Generalized abdominal pain    Rx / DC Orders ED Discharge Orders         Ordered    cyclobenzaprine (FLEXERIL) 5 MG tablet  3 times daily PRN        04/11/20 2158           Gaylan Gerold, DO 04/11/20 2209    Drenda Freeze, MD 04/11/20 2237

## 2020-04-11 NOTE — ED Triage Notes (Signed)
Pt c/o cont. Dull midsternal chest pain, 6/10 radiating to back. States she has a hx of such. Denies any other sx.

## 2020-04-11 NOTE — Discharge Instructions (Addendum)
Ms. Castellanos, it is a pleasure getting to know you today. You came into the ED for chest pain. Your blood work came back reassuring. The troponin is negative which means you did not have a heart attack. The imaging of your abdomen shows hiatal hernia and esophagitis, which means inflammation of your food pipe. Your chest pain is likely related to heartburn. Your coughing also can exacerbate the pain. Please continue taking Protonix 40 mg daily. Please avoid all NSAIDs such as Advil. You can take Tylenol for pain. I also prescribed Flexeril for muscle relaxant, you can take 1 pill up to 3 times a day. Please follow-up with your primary care doctor and gastroenterology. Please come back to the ED for worsening chest pain or shortness of breath.  Take care  Dr. Alfonse Spruce

## 2020-04-21 ENCOUNTER — Other Ambulatory Visit: Payer: Self-pay | Admitting: Internal Medicine

## 2020-04-25 ENCOUNTER — Telehealth: Payer: Self-pay | Admitting: Internal Medicine

## 2020-04-25 NOTE — Telephone Encounter (Signed)
ATC pt, there was no answer and no option to leave a message. Will try back. 

## 2020-04-26 MED ORDER — GABAPENTIN 100 MG PO CAPS: ORAL_CAPSULE | ORAL | 0 refills | Status: DC

## 2020-04-26 NOTE — Telephone Encounter (Signed)
Pt is unavailable until after 4:00pm.  MW please advise- pt requesting refill on gabapentin 100mg . Last refill 04/11/20 #60 tabs, take 1 tab BID with 0 refills.     Called pharmacy to verify this rx, pharmacy states that they never received rx.  Last filled rx was in July for a 90 day supply.  MW ok to refill gabapentin?  Thanks!

## 2020-04-26 NOTE — Telephone Encounter (Signed)
Prescription sent to pharmacy for 90 day supply.  Nothing further needed.

## 2020-04-26 NOTE — Telephone Encounter (Signed)
yes

## 2020-04-26 NOTE — Telephone Encounter (Signed)
ATC x1 to make her aware that the gabapentin had been sent to her pharmacy for a 90 day supply, line rang and then went to a busy signal.

## 2020-04-26 NOTE — Telephone Encounter (Signed)
Pt is returning call from yesterday regarding the pt is requesting refill of gabapentin (NEURONTIN) 100 MG - pharmacy [facility] -  Please call [phone] - after 4:00

## 2020-06-26 ENCOUNTER — Other Ambulatory Visit: Payer: Self-pay | Admitting: Internal Medicine

## 2020-06-26 MED ORDER — SPIRIVA RESPIMAT 2.5 MCG/ACT IN AERS: 2.0000 | INHALATION_SPRAY | Freq: Every day | RESPIRATORY_TRACT | 0 refills | Status: DC

## 2020-07-10 ENCOUNTER — Other Ambulatory Visit: Payer: Self-pay | Admitting: Internal Medicine

## 2020-07-10 MED ORDER — GABAPENTIN 100 MG PO CAPS: ORAL_CAPSULE | ORAL | 1 refills | Status: DC

## 2020-09-02 ENCOUNTER — Other Ambulatory Visit: Payer: Self-pay | Admitting: Internal Medicine

## 2020-09-08 ENCOUNTER — Other Ambulatory Visit: Payer: Self-pay | Admitting: Internal Medicine

## 2020-09-10 ENCOUNTER — Other Ambulatory Visit: Payer: Self-pay | Admitting: Internal Medicine

## 2020-09-21 ENCOUNTER — Telehealth: Payer: Self-pay

## 2020-09-21 NOTE — Telephone Encounter (Signed)
Pharmacy name: [facility] Drug requested: Spiriva 2.5 CMM?: yes Key: [id number] Covered alternatives: Atrovent HFA, Incruse Tried and failed: on file Decision: sent to plan, determination expected in 1-3 business days. [facility].

## 2020-09-25 NOTE — Telephone Encounter (Signed)
Checked Covermymeds and found:  Message from Plan Your request has been approved  [facility] and spoke with Lysbeth Galas, advised that the PA was approved for the Spiriva, advised that there was a zero copay.  Called and spoke with patient, advised that the PA for the Spiriva was approved and the pharmacy stated it would be a zero copay.   Patient wants to come in for an OV to see Dr. Melvyn Novas, no openings available.  Dr. Melvyn Novas, please advise if ok to use a hold slot to put patient.  Thank you.

## 2020-09-25 NOTE — Telephone Encounter (Signed)
Patient states insurance denied Spiriva. Needs to be sent to them medically necessary. Insurance is [employer]. Also would like samples of Spiriva. Patient phone number is [phone].

## 2020-09-25 NOTE — Telephone Encounter (Signed)
Ok but must bring all active meds to regroup

## 2020-09-25 NOTE — Telephone Encounter (Signed)
ATC no voice mail to leave message. Will attempt to reach at later time.

## 2020-09-26 NOTE — Telephone Encounter (Signed)
ATC LVMTCB x1. If pt calls back front staff please schedule pt for first available with Dr. Melvyn Novas. Dr. Melvyn Novas ok'd to use held appointment slot. Remind to pt to all active meds to appointment.

## 2020-09-28 NOTE — Telephone Encounter (Signed)
Received fax from [employer] that the spiriva 2.5 is approved from 07-07-20 - 07-06-21  Approval faxed

## 2020-10-21 ENCOUNTER — Encounter (HOSPITAL_COMMUNITY): Payer: Self-pay

## 2020-10-21 ENCOUNTER — Other Ambulatory Visit: Payer: Self-pay

## 2020-10-21 ENCOUNTER — Emergency Department (HOSPITAL_COMMUNITY)
Admission: EM | Admit: 2020-10-21 | Discharge: 2020-10-21 | Disposition: A | Discharge status: Home / Self Care | Payer: Medicare HMO | Attending: Emergency Medicine | Admitting: Emergency Medicine

## 2020-10-21 DIAGNOSIS — Z87891 Personal history of nicotine dependence: Secondary | ICD-10-CM | POA: Insufficient documentation

## 2020-10-21 DIAGNOSIS — R202 Paresthesia of skin: Secondary | ICD-10-CM | POA: Diagnosis present

## 2020-10-21 DIAGNOSIS — J449 Chronic obstructive pulmonary disease, unspecified: Secondary | ICD-10-CM | POA: Insufficient documentation

## 2020-10-21 DIAGNOSIS — G5603 Carpal tunnel syndrome, bilateral upper limbs: Secondary | ICD-10-CM | POA: Diagnosis not present

## 2020-10-21 DIAGNOSIS — Z79899 Other long term (current) drug therapy: Secondary | ICD-10-CM | POA: Diagnosis not present

## 2020-10-21 DIAGNOSIS — I10 Essential (primary) hypertension: Secondary | ICD-10-CM | POA: Insufficient documentation

## 2020-10-21 DIAGNOSIS — Z7951 Long term (current) use of inhaled steroids: Secondary | ICD-10-CM | POA: Insufficient documentation

## 2020-10-21 DIAGNOSIS — J45909 Unspecified asthma, uncomplicated: Secondary | ICD-10-CM | POA: Insufficient documentation

## 2020-10-21 NOTE — ED Provider Notes (Signed)
[facility] DEPT Provider Note   CSN: [id number] Arrival date & time: 10/21/20  2952     History Chief Complaint  Patient presents with  . Numbness    Left hand    Brittany Sparks is a 55 y.o. female.  Patient presents to the emergency department with a chief complaint of left hand numbness.  She also reports that she has had numbness in her right hand as well.  She states that the symptoms are exacerbated when she is driving down the road gripping is doing well very tight.  She works as a Social research officer, government.  She denies any treatments prior to arrival.  She states that she has some pain that radiates proximally from the wrists.  She denies any other associated symptoms.  The history is provided by the patient. No language interpreter was used.       Past Medical History:  Diagnosis Date  . Asthma   . Constipation   . COPD (chronic obstructive pulmonary disease) ([place])   . Drug addiction ([place])   . GERD (gastroesophageal reflux disease)   . Hepatitis C   . Herpes simplex type 2 infection   . Hyperlipidemia   . Hypertension   . Recurrent mouth ulceration   . SBO (small bowel obstruction) ([place])    2014    Patient Active Problem List   Diagnosis Date Noted  . Esophagitis 04/11/2020  . Generalized abdominal pain 04/11/2020  . Morbid obesity ([place]) 06/17/2015  . Shortness of breath 02/07/2013  . Atypical chest pain 02/07/2013  . Upper airway cough syndrome 04/26/2012  . HTN (hypertension) 02/22/2012  . COPD GOLD II 07/08/2011    Past Surgical History:  Procedure Laterality Date  . ABDOMINAL HYSTERECTOMY    . CESAREAN SECTION    . CHOLECYSTECTOMY    . HERNIA REPAIR       OB History   No obstetric history on file.     Family History  Problem Relation Age of Onset  . Emphysema Father   . Heart disease Father   . Clotting disorder Father     Social History   Tobacco Use  . Smoking status: Former Smoker    Packs/day: 0.50     Years: 30.00    Pack years: 15.00    Types: Cigarettes    Quit date: 11/05/2006    Years since quitting: 13.9  . Smokeless tobacco: Never Used  Substance Use Topics  . Alcohol use: No  . Drug use: No    Home Medications Prior to Admission medications   Medication Sig Start Date End Date Taking? Authorizing Provider  amLODipine (NORVASC) 5 MG tablet Take 5 mg by mouth daily.    [provider]  budesonide-formoterol (SYMBICORT) 80-4.5 MCG/ACT inhaler Inhale 2 puffs into the lungs 2 (two) times daily. 10/22/18   Tanda Rockers, MD  famotidine (PEPCID) 20 MG tablet One at bedtime 02/24/18   Tanda Rockers, MD  gabapentin (NEURONTIN) 100 MG capsule TAKE 1 CAPSULE(100 MG) BY MOUTH TWICE DAILY 07/10/20   Tanda Rockers, MD  Guaifenesin 1200 MG TB12 Take by mouth. 08/10/19   [provider]  ID NOW COVID-19 KIT See admin instructions. 12/01/19   [provider]  methylPREDNISolone (MEDROL DOSEPAK) 4 MG TBPK tablet See admin instructions. 12/02/19   [provider]  olmesartan (BENICAR) 20 MG tablet Take 20 mg by mouth daily.    [provider]  pantoprazole (PROTONIX) 40 MG tablet TAKE 1  TABLET(40 MG) BY MOUTH DAILY 30 TO 60 MINUTES BEFORE FIRST MEAL OF THE DAY 10/06/18   Tanda Rockers, MD  phentermine (ADIPEX-P) 37.5 MG tablet Take 18.75 mg by mouth every morning. 09/30/19   [provider]  simvastatin (ZOCOR) 10 MG tablet Take 10 mg by mouth at bedtime.    [provider]  SPIRIVA RESPIMAT 2.5 MCG/ACT AERS INHALE 2 PUFFS INTO THE LUNGS DAILY 09/10/20   Tanda Rockers, MD  traMADol (ULTRAM) 50 MG tablet Take 1 tablet (50 mg total) by mouth every 6 (six) hours as needed. 12/16/19   Parrett, Fonnie Mu, NP  valACYclovir (VALTREX) 1000 MG tablet Take 1,000 mg by mouth 2 (two) times daily.    [provider]    Allergies    Patient has no known allergies.  Review of Systems   Review of Systems  All other systems reviewed and are  negative.   Physical Exam Updated Vital Signs BP (!) 121/92 (BP Location: Right Arm)   Pulse 70   Temp 98.5 F (36.9 C) (Oral)   Resp 18   Ht 5' (1.524 m)   Wt 62 kg   SpO2 99%   BMI 26.68 kg/m   Physical Exam Vitals and nursing note reviewed.  Constitutional:      General: She is not in acute distress.    Appearance: She is well-developed.  HENT:     Head: Normocephalic and atraumatic.  Eyes:     Conjunctiva/sclera: Conjunctivae normal.  Cardiovascular:     Rate and Rhythm: Normal rate.     Pulses: Normal pulses.     Heart sounds: No murmur heard.     Comments: Intact radial pulses bilaterally Brisk cap refill Pulmonary:     Effort: Pulmonary effort is normal. No respiratory distress.  Abdominal:     General: There is no distension.  Musculoskeletal:     Cervical back: Neck supple.     Comments: Positive tinel ROM and strength of bilateral hands and wrists 5/5  Skin:    General: Skin is warm and dry.  Neurological:     Mental Status: She is alert and oriented to person, place, and time.  Psychiatric:        Mood and Affect: Mood normal.        Behavior: Behavior normal.     ED Results / Procedures / Treatments   Labs (all labs ordered are listed, but only abnormal results are displayed) Labs Reviewed - No data to display  EKG None  Radiology No results found.  Procedures Procedures   Medications Ordered in ED Medications - No data to display  ED Course  I have reviewed the triage vital signs and the nursing notes.  Pertinent labs & imaging results that were available during my care of the patient were reviewed by me and considered in my medical decision making (see chart for details).    MDM Rules/Calculators/A&P                          Patient with bilateral hand numbness.  Had right hand numbness yesterday, has left hand numbness today.  She works as a Social research officer, government.  She also reports worsening symptoms when she grips her  steering wheel tightly and is vibrating down the road.  Symptoms seem most consistent with carpal tunnel syndrome.  Recommend PCP follow-up.  Patient given Velcro wrist splints and instructed to wear them at night. Final Clinical  Impression(s) / ED Diagnoses Final diagnoses:  Carpal tunnel syndrome, bilateral    Rx / DC Orders ED Discharge Orders    None       Montine Circle, PA-C 10/21/20 0553    Shanon Rosser, MD 10/21/20 [phone]

## 2020-10-21 NOTE — ED Triage Notes (Signed)
Patient arrived stating she woke up this morning and her left hand fells numb, ROM in tact. Declines any pain at this time. No other neuro symptoms at this time.

## 2021-02-11 ENCOUNTER — Other Ambulatory Visit: Payer: Self-pay | Admitting: Internal Medicine

## 2021-08-06 ENCOUNTER — Emergency Department (HOSPITAL_COMMUNITY): Payer: Medicaid Other

## 2021-08-06 ENCOUNTER — Encounter (HOSPITAL_COMMUNITY): Payer: Self-pay

## 2021-08-06 ENCOUNTER — Emergency Department (HOSPITAL_COMMUNITY)
Admission: EM | Admit: 2021-08-06 | Discharge: 2021-08-06 | Disposition: A | Discharge status: Home / Self Care | Payer: Medicaid Other | Attending: Emergency Medicine | Admitting: Emergency Medicine

## 2021-08-06 DIAGNOSIS — R059 Cough, unspecified: Secondary | ICD-10-CM | POA: Diagnosis not present

## 2021-08-06 DIAGNOSIS — R5383 Other fatigue: Secondary | ICD-10-CM | POA: Diagnosis not present

## 2021-08-06 DIAGNOSIS — Z7951 Long term (current) use of inhaled steroids: Secondary | ICD-10-CM | POA: Diagnosis not present

## 2021-08-06 DIAGNOSIS — J449 Chronic obstructive pulmonary disease, unspecified: Secondary | ICD-10-CM | POA: Insufficient documentation

## 2021-08-06 DIAGNOSIS — R42 Dizziness and giddiness: Secondary | ICD-10-CM

## 2021-08-06 DIAGNOSIS — Z20822 Contact with and (suspected) exposure to covid-19: Secondary | ICD-10-CM | POA: Diagnosis not present

## 2021-08-06 DIAGNOSIS — I10 Essential (primary) hypertension: Secondary | ICD-10-CM | POA: Diagnosis not present

## 2021-08-06 DIAGNOSIS — Z79899 Other long term (current) drug therapy: Secondary | ICD-10-CM | POA: Diagnosis not present

## 2021-08-06 LAB — CBC WITH DIFFERENTIAL/PLATELET
Abs Immature Granulocytes: 0.01 10*3/uL (ref 0.00–0.07)
Basophils Absolute: 0 10*3/uL (ref 0.0–0.1)
Basophils Relative: 1 %
Eosinophils Absolute: 0 10*3/uL (ref 0.0–0.5)
Eosinophils Relative: 1 %
HCT: 37.4 % (ref 36.0–46.0)
Hemoglobin: 12 g/dL (ref 12.0–15.0)
Immature Granulocytes: 0 %
Lymphocytes Relative: 42 %
Lymphs Abs: 2 10*3/uL (ref 0.7–4.0)
MCH: 30.9 pg (ref 26.0–34.0)
MCHC: 32.1 g/dL (ref 30.0–36.0)
MCV: 96.4 fL (ref 80.0–100.0)
Monocytes Absolute: 0.5 10*3/uL (ref 0.1–1.0)
Monocytes Relative: 10 %
Neutro Abs: 2.2 10*3/uL (ref 1.7–7.7)
Neutrophils Relative %: 46 %
Platelets: 344 10*3/uL (ref 150–400)
RBC: 3.88 MIL/uL (ref 3.87–5.11)
RDW: 11.9 % (ref 11.5–15.5)
WBC: 4.7 10*3/uL (ref 4.0–10.5)
nRBC: 0 % (ref 0.0–0.2)

## 2021-08-06 LAB — COMPREHENSIVE METABOLIC PANEL
ALT: 18 U/L (ref 0–44)
AST: 21 U/L (ref 15–41)
Albumin: 4.2 g/dL (ref 3.5–5.0)
Alkaline Phosphatase: 58 U/L (ref 38–126)
Anion gap: 10 (ref 5–15)
BUN: 8 mg/dL (ref 6–20)
CO2: 28 mmol/L (ref 22–32)
Calcium: 9.5 mg/dL (ref 8.9–10.3)
Chloride: 102 mmol/L (ref 98–111)
Creatinine, Ser: 0.68 mg/dL (ref 0.44–1.00)
GFR, Estimated: >60 mL/min (ref >60)
Glucose, Bld: 97 mg/dL (ref 70–99)
Potassium: 3.6 mmol/L (ref 3.5–5.1)
Sodium: 140 mmol/L (ref 135–145)
Total Bilirubin: 0.4 mg/dL (ref 0.3–1.2)
Total Protein: 8.4 g/dL — ABNORMAL HIGH (ref 6.5–8.1)

## 2021-08-06 LAB — URINALYSIS, ROUTINE W REFLEX MICROSCOPIC
Bilirubin Urine: NEGATIVE
Glucose, UA: NEGATIVE mg/dL
Hgb urine dipstick: NEGATIVE
Ketones, ur: NEGATIVE mg/dL
Leukocytes,Ua: NEGATIVE
Nitrite: NEGATIVE
Protein, ur: NEGATIVE mg/dL
Specific Gravity, Urine: 1.008 (ref 1.005–1.030)
pH: 5 (ref 5.0–8.0)

## 2021-08-06 LAB — CBG MONITORING, ED
Glucose-Capillary: 90 mg/dL (ref 70–99)
Glucose-Capillary: 86 mg/dL (ref 70–99)

## 2021-08-06 LAB — LIPASE, BLOOD: Lipase: 33 U/L (ref 11–51)

## 2021-08-06 LAB — RESP PANEL BY RT-PCR (FLU A&B, COVID) ARPGX2
Influenza A by PCR: NEGATIVE
Influenza B by PCR: NEGATIVE
SARS Coronavirus 2 by RT PCR: NEGATIVE

## 2021-08-06 NOTE — ED Provider Triage Note (Signed)
Emergency Medicine Provider Triage Evaluation Note  Brittany Sparks , a 56 y.o. female  was evaluated in triage.  Pt complains of constant lightheadedness for the past 3 days. Denies any room spinning sensation. Recent completion of H. Pylori medications over a week ago. Reports her stool is a dark green, possible black in color. Reports fever of 102.44F on Sunday. She reports she had a "bronchial infection" over the weekend that she treated with left over Amoxicillin. Also reports yeast infection from H pylori medication.  H/o COPD  Review of Systems  Positive: Abdominal pain, diarrhea, cough, rhinorrhea,  Negative: Nausea, vomiting, constipation, chest pain, SOB, dysuria, hematuria.  Physical Exam  BP 116/88 (BP Location: Left Arm)    Pulse 87    Temp 98.9 F (37.2 C) (Oral)    Resp 16    Ht 5' (1.524 m)    Wt 68.9 kg    SpO2 95%    BMI 29.69 kg/m  Gen:   Awake, no distress   Resp:  Normal effort, diminished throughout.   MSK:   Moves extremities without difficulty  Other:  Abdomen soft, generalized tenderness  Medical Decision Making  Medically screening exam initiated at 12:49 PM.  Appropriate orders placed.  Brittany Sparks was informed that the remainder of the evaluation will be completed by another provider, this initial triage assessment does not replace that evaluation, and the importance of remaining in the ED until their evaluation is complete.  Basic labs and CXR ordered   Sherrell Puller, Hershal Coria 08/06/21 1256

## 2021-08-06 NOTE — Discharge Instructions (Signed)
You were seen here today for evaluation of your lightheadedness. Your labs and imaging were normal. You were negative for COVID and the flu. I think this is likely fatigue from your recent antibiotics and illness. For your yeast infection, you can pick up an over the counter Monistat 7, or another off brand for use. Please make sure to stay well hydrated with plenty of fluids and obtain plenty of rest. I have given you a note off from work for the next few days. Please follow up with your PCP within a week for re-evaluation. If you have any fainting, chest pain, SOB, vision changes, or falling, return to the ED for re-evaluation.

## 2021-08-06 NOTE — ED Triage Notes (Signed)
Pt presents with c/o dizziness for 3 days. Pt reports she is a pre-diabetic. Pt denies any hx of vertigo.

## 2021-08-06 NOTE — ED Provider Notes (Addendum)
[facility] DEPT Provider Note   CSN: [id number] Arrival date & time: 08/06/21  1116     History Chief Complaint  Patient presents with   Dizziness   Brittany Sparks is a 56 y.o. female with h/o HTN, COPD, HLD, Hep C, presents to the ED for evaluation of lightheadedness for the past 3 days. Denies any falls or room spinning sensation.  She reports she feels more lightheaded with head movement.  Denies any visual changes or weakness, but mentions that she has been fatigued.  Denies any near-syncope or syncopal episodes.  The patient reports that she has a chronic cough, but has noticed some worsening of her cough that she treated with some left over Amoxicillin she had in addition to some Theraflu Express Max. She reports that this has improved over the weekend. She mentions she had one recorded fever of 102.23F but has not had another fever. She denies any SOB, chest pain, nasal congestion, rhinorrhea, or sore throat. Overall, the reports that she thinks that she is tired because she doesn't get enough rest from working too much. Additionally, she recently completed antibiotic treatment for H. Pylori. She mentions that she has some vaginal itching after taking the antibiotics as well. Denies any vaginal discharge.    Dizziness Associated symptoms: no chest pain, no nausea, no shortness of breath, no vomiting and no weakness       Home Medications Prior to Admission medications   Medication Sig Start Date End Date Taking? Authorizing Provider  amLODipine (NORVASC) 5 MG tablet Take 5 mg by mouth daily.    [provider]  budesonide-formoterol (SYMBICORT) 80-4.5 MCG/ACT inhaler Inhale 2 puffs into the lungs 2 (two) times daily. 10/22/18   Tanda Rockers, MD  famotidine (PEPCID) 20 MG tablet One at bedtime 02/24/18   Tanda Rockers, MD  gabapentin (NEURONTIN) 100 MG capsule TAKE 1 CAPSULE(100 MG) BY MOUTH TWICE DAILY 02/12/21   Tanda Rockers, MD   Guaifenesin 1200 MG TB12 Take by mouth. 08/10/19   [provider]  ID NOW COVID-19 KIT See admin instructions. 12/01/19   [provider]  methylPREDNISolone (MEDROL DOSEPAK) 4 MG TBPK tablet See admin instructions. 12/02/19   [provider]  olmesartan (BENICAR) 20 MG tablet Take 20 mg by mouth daily.    [provider]  pantoprazole (PROTONIX) 40 MG tablet TAKE 1 TABLET(40 MG) BY MOUTH DAILY 30 TO 60 MINUTES BEFORE FIRST MEAL OF THE DAY 10/06/18   Tanda Rockers, MD  phentermine (ADIPEX-P) 37.5 MG tablet Take 18.75 mg by mouth every morning. 09/30/19   [provider]  simvastatin (ZOCOR) 10 MG tablet Take 10 mg by mouth at bedtime.    [provider]  SPIRIVA RESPIMAT 2.5 MCG/ACT AERS INHALE 2 PUFFS INTO THE LUNGS DAILY 09/10/20   Tanda Rockers, MD  traMADol (ULTRAM) 50 MG tablet Take 1 tablet (50 mg total) by mouth every 6 (six) hours as needed. 12/16/19   Parrett, Fonnie Mu, NP  valACYclovir (VALTREX) 1000 MG tablet Take 1,000 mg by mouth 2 (two) times daily.    [provider]      Allergies    Patient has no known allergies.    Review of Systems   Review of Systems  Constitutional:  Positive for fatigue and fever.  HENT:  Negative for congestion, rhinorrhea and sore throat.   Respiratory:  Positive for cough. Negative for shortness of breath.   Cardiovascular:  Negative for chest  pain.  Gastrointestinal:  Positive for abdominal pain. Negative for nausea and vomiting.  Genitourinary:  Negative for dysuria, frequency, hematuria, urgency and vaginal discharge.  Musculoskeletal:  Negative for gait problem and myalgias.  Neurological:  Positive for light-headedness. Negative for dizziness and weakness.  All other systems reviewed and are negative.  Physical Exam Updated Vital Signs BP (!) 122/93 (BP Location: Left Arm)    Pulse 77    Temp 98.8 F (37.1 C) (Oral)    Resp 14    Ht 5' (1.524 m)    Wt 68.9 kg    SpO2 95%    BMI  29.69 kg/m  Physical Exam Vitals and nursing note reviewed.  Constitutional:      General: She is not in acute distress.    Appearance: Normal appearance. She is not toxic-appearing.  HENT:     Head: Normocephalic and atraumatic.     Right Ear: Tympanic membrane, ear canal and external ear normal.     Left Ear: Tympanic membrane, ear canal and external ear normal.     Nose: Nose normal.     Mouth/Throat:     Mouth: Mucous membranes are moist.     Pharynx: No oropharyngeal exudate or posterior oropharyngeal erythema.  Eyes:     General: No scleral icterus.    Extraocular Movements: Extraocular movements intact.     Pupils: Pupils are equal, round, and reactive to light.  Cardiovascular:     Rate and Rhythm: Normal rate and regular rhythm.  Pulmonary:     Effort: Pulmonary effort is normal. No respiratory distress.     Comments: Mildly diminished lung sounds. She is speaking in full sentences with ease. No respiratory distress, accessory muscle use, nasal flaring, tripoding, or cyanosis present.  Patient satting 100% on room air.  No increased work of breathing. Abdominal:     General: Abdomen is flat. Bowel sounds are normal.     Palpations: Abdomen is soft.     Tenderness: There is abdominal tenderness. There is no guarding or rebound.     Comments: Mild generalized abdominal tenderness. No guarding or rebound. NBS. No overlying skin changes noted.   Musculoskeletal:        General: No deformity.     Cervical back: Normal range of motion.     Right lower leg: No edema.     Left lower leg: No edema.  Skin:    General: Skin is warm and dry.  Neurological:     General: No focal deficit present.     Mental Status: She is alert and oriented to person, place, and time. Mental status is at baseline.     GCS: GCS eye subscore is 4. GCS verbal subscore is 5. GCS motor subscore is 6.     Cranial Nerves: Cranial nerves 2-12 are intact. No cranial nerve deficit.     Sensory: No sensory  deficit.     Motor: No weakness.     Coordination: Romberg sign negative. Coordination normal. Finger-Nose-Finger Test and Heel to [facility] Test normal.     Gait: Gait is intact. Gait normal.     Comments: Answering questions appropriately with appropriate speech. GCS 15. Cranial nerves intact. No pronator drift. Sensation intact. Coordination intact. Gait normal.     ED Results / Procedures / Treatments   Labs (all labs ordered are listed, but only abnormal results are displayed) Labs Reviewed  COMPREHENSIVE METABOLIC PANEL - Abnormal; Notable for the following components:      Result  Value   Total Protein 8.4 (*)    All other components within normal limits  URINALYSIS, ROUTINE W REFLEX MICROSCOPIC - Abnormal; Notable for the following components:   Color, Urine STRAW (*)    All other components within normal limits  RESP PANEL BY RT-PCR (FLU A&B, COVID) ARPGX2  LIPASE, BLOOD  CBC WITH DIFFERENTIAL/PLATELET  CBG MONITORING, ED    EKG EKG Interpretation  Date/Time:  Tuesday August 06 2021 13:02:14 EST Ventricular Rate:  75 PR Interval:  143 QRS Duration: 85 QT Interval:  396 QTC Calculation: 443 R Axis:   56 Text Interpretation: Sinus rhythm Baseline wander in lead(s) V1 No significant change since last tracing Confirmed by Blanchie Dessert [id number]) on 08/06/2021 4:38:59 PM  Radiology DG Chest 2 View  Result Date: 08/06/2021 CLINICAL DATA:  Cough and congestion.  COPD. EXAM: CHEST - 2 VIEW COMPARISON:  04/11/2020 FINDINGS: The cardiomediastinal silhouette is unchanged with normal heart size. The lungs are well inflated. No airspace consolidation, edema, pleural effusion, or pneumothorax is identified. No acute osseous abnormality is seen. IMPRESSION: No active cardiopulmonary disease. Electronically Signed   By: Logan Bores M.D.   On: 08/06/2021 13:17    Procedures Procedures   Medications Ordered in ED Medications - No data to display  ED Course/ Medical Decision Making/  A&P                           Medical Decision Making Amount and/or Complexity of Data Reviewed Labs: ordered. Radiology: ordered.   56 year old female presents the emergency department for evaluation of lightheadedness.  Differential diagnosis includes but is not limited to dehydration, anemia, stroke, TIA, viral illness, general deconditioning, vertigo.  Vital signs show normotension, slightly elevated diastolic in the 58N, but not concerning.  Afebrile, normal pulse rate, satting 100% on room air.  Physical exam shows some mildly tenderness to palpation of her general abdomen, although the patient reports is been chronic for the past few months but has been getting better.  On chart evaluation, previous records show that she has general abdominal pain for the past few months now given the H. pylori.  This is not a new problem.  Mildly diminished lung sounds throughout, patient known history of COPD.  No respiratory distress or accessory muscle use.  Patient speaking full signs with ease.  Satting 100% on room air.  Unremarkable neurological exam.  Cranial nerves II through XII intact, no pronator drift, strength 5 out of 5 in upper and lower bilateral extremities.  Sensation intact throughout.  Finger-nose intact.  No abnormal coordination.  Normal gait.  The patient reports that she becomes more lightheaded with head movement, however the patient was moving her head observing the emergency department with my interview and did not appear any more lightheaded with motion of her head from side to side.  Given the unremarkable neurological exam, low suspicion for stroke or TIA at this time.  Will order labs, EKG, and chest x-ray.  I independently interpreted and reviewed the patient's labs and imaging.  CBC shows no signs of leukocytosis or anemia present.  Urinalysis shows relatively dilute urine without any evidence of UTI.  Negative lipase.  POC CBG 86.  CMP shows no electrolyte abnormalities.   Mildly increased protein 8.4.  Negative for COVID and flu.  Chest x-ray shows no acute cardiopulmonary process.  EKG shows sinus rhythm with no change since previous tracing. Normal orthostatics.  Given the reassuring labs and  imaging, I suspect that the patient has some general deconditioning and fatigue given her recent H. pylori infection, antibiotic use, and working.  She reports to me that she thinks that she will feel better with a few days off of work.  I think this is reasonable.  I discussed with her to help with her mildly diminished breath sounds to increase her use of her albuterol inhaler for relief of symptoms.  Recommended plenty of fluids and rest.  Strict return precautions discussed with her.  Patient agrees with plan.  Patient is stable being discharged home in good condition.  I discussed this case with my attending physician who cosigned this note including patient's presenting symptoms, physical exam, and planned diagnostics and interventions. Attending physician stated agreement with plan or made changes to plan which were implemented.   Final Clinical Impression(s) / ED Diagnoses Final diagnoses:  Lightheadedness    Rx / DC Orders ED Discharge Orders     None         Sherrell Puller, PA-C 08/07/21 1632    Sherrell Puller, PA-C 08/07/21 1633    Pattricia Boss, MD 08/08/21 1017

## 2022-09-16 ENCOUNTER — Other Ambulatory Visit: Payer: Self-pay

## 2022-09-16 ENCOUNTER — Emergency Department (HOSPITAL_COMMUNITY)
Admission: EM | Admit: 2022-09-16 | Discharge: 2022-09-16 | Disposition: A | Discharge status: Home / Self Care | Payer: Medicaid Other | Attending: Emergency Medicine | Admitting: Emergency Medicine

## 2022-09-16 DIAGNOSIS — N3001 Acute cystitis with hematuria: Secondary | ICD-10-CM

## 2022-09-16 DIAGNOSIS — R1030 Lower abdominal pain, unspecified: Secondary | ICD-10-CM | POA: Diagnosis present

## 2022-09-16 LAB — URINALYSIS, ROUTINE W REFLEX MICROSCOPIC
Bilirubin Urine: NEGATIVE
Glucose, UA: NEGATIVE mg/dL
Ketones, ur: NEGATIVE mg/dL
Nitrite: POSITIVE — AB
Protein, ur: 100 mg/dL — AB
RBC / HPF: >50 RBC/hpf (ref 0–5)
Specific Gravity, Urine: 1.014 (ref 1.005–1.030)
WBC, UA: >50 WBC/hpf (ref 0–5)
pH: 5 (ref 5.0–8.0)

## 2022-09-16 LAB — CBC WITH DIFFERENTIAL/PLATELET
Abs Immature Granulocytes: 0.02 10*3/uL (ref 0.00–0.07)
Basophils Absolute: 0 10*3/uL (ref 0.0–0.1)
Basophils Relative: 0 %
Eosinophils Absolute: 0 10*3/uL (ref 0.0–0.5)
Eosinophils Relative: 0 %
HCT: 40.4 % (ref 36.0–46.0)
Hemoglobin: 12.6 g/dL (ref 12.0–15.0)
Immature Granulocytes: 0 %
Lymphocytes Relative: 15 %
Lymphs Abs: 1.6 10*3/uL (ref 0.7–4.0)
MCH: 30.4 pg (ref 26.0–34.0)
MCHC: 31.2 g/dL (ref 30.0–36.0)
MCV: 97.6 fL (ref 80.0–100.0)
Monocytes Absolute: 0.8 10*3/uL (ref 0.1–1.0)
Monocytes Relative: 7 %
Neutro Abs: 8 10*3/uL — ABNORMAL HIGH (ref 1.7–7.7)
Neutrophils Relative %: 78 %
Platelets: 291 10*3/uL (ref 150–400)
RBC: 4.14 MIL/uL (ref 3.87–5.11)
RDW: 12.4 % (ref 11.5–15.5)
WBC: 10.5 10*3/uL (ref 4.0–10.5)
nRBC: 0 % (ref 0.0–0.2)

## 2022-09-16 LAB — COMPREHENSIVE METABOLIC PANEL
ALT: 19 U/L (ref 0–44)
AST: 19 U/L (ref 15–41)
Albumin: 4.6 g/dL (ref 3.5–5.0)
Alkaline Phosphatase: 61 U/L (ref 38–126)
Anion gap: 11 (ref 5–15)
BUN: 12 mg/dL (ref 6–20)
CO2: 27 mmol/L (ref 22–32)
Calcium: 9.2 mg/dL (ref 8.9–10.3)
Chloride: 102 mmol/L (ref 98–111)
Creatinine, Ser: 0.99 mg/dL (ref 0.44–1.00)
GFR, Estimated: >60 mL/min (ref >60)
Glucose, Bld: 109 mg/dL — ABNORMAL HIGH (ref 70–99)
Potassium: 4.1 mmol/L (ref 3.5–5.1)
Sodium: 140 mmol/L (ref 135–145)
Total Bilirubin: 0.8 mg/dL (ref 0.3–1.2)
Total Protein: 8 g/dL (ref 6.5–8.1)

## 2022-09-16 MED ORDER — CIPROFLOXACIN HCL 500 MG PO TABS: 500.0000 mg | ORAL_TABLET | Freq: Two times a day (BID) | ORAL | 0 refills | Status: AC

## 2022-09-16 NOTE — ED Provider Notes (Signed)
Brittany Sparks Provider Note   CSN: [id number] Arrival date & time: 09/16/22  1029     History  Chief Complaint  Patient presents with   Flank Pain    Brittany Sparks is a 57 y.o. female presenting to the ED complaining of lower abdominal pain, bilateral flank pain, urinary odor, subjective fever, and chills for the past day.  She reports that she started self catheterizing 4 days ago due to history of urinary retention. She does still void urine on her own but does not completely empty bladder.  She is scheduled for procedure at her urologist office on May 1 to attempt to determine cause of her urinary retention.  She has a remote history of frequent urinary tract infections but has not had one in several years.  She has not been on antibiotics recently.  She denies nausea, vomiting, diarrhea, chest pain, shortness of breath, or other complaints today.  She states that she did not receive good education regarding catheterizing and she is afraid that she infected her bladder due to a lack of skill during this procedure.      Home Medications Prior to Admission medications   Medication Sig Start Date End Date Taking? Authorizing Provider  ciprofloxacin (CIPRO) 500 MG tablet Take 1 tablet (500 mg total) by mouth 2 (two) times daily for 10 days. 09/16/22 09/26/22 Yes Lianni Kanaan L, PA-C  amLODipine (NORVASC) 5 MG tablet Take 5 mg by mouth daily.    [provider]  budesonide-formoterol (SYMBICORT) 80-4.5 MCG/ACT inhaler Inhale 2 puffs into the lungs 2 (two) times daily. 10/22/18   Tanda Rockers, MD  famotidine (PEPCID) 20 MG tablet One at bedtime 02/24/18   Tanda Rockers, MD  gabapentin (NEURONTIN) 100 MG capsule TAKE 1 CAPSULE(100 MG) BY MOUTH TWICE DAILY 02/12/21   Tanda Rockers, MD  Guaifenesin 1200 MG TB12 Take by mouth. 08/10/19   [provider]  ID NOW COVID-19 KIT See admin instructions. 12/01/19   [provider]   methylPREDNISolone (MEDROL DOSEPAK) 4 MG TBPK tablet See admin instructions. 12/02/19   [provider]  olmesartan (BENICAR) 20 MG tablet Take 20 mg by mouth daily.    [provider]  pantoprazole (PROTONIX) 40 MG tablet TAKE 1 TABLET(40 MG) BY MOUTH DAILY 30 TO 60 MINUTES BEFORE FIRST MEAL OF THE DAY 10/06/18   Tanda Rockers, MD  phentermine (ADIPEX-P) 37.5 MG tablet Take 18.75 mg by mouth every morning. 09/30/19   [provider]  simvastatin (ZOCOR) 10 MG tablet Take 10 mg by mouth at bedtime.    [provider]  SPIRIVA RESPIMAT 2.5 MCG/ACT AERS INHALE 2 PUFFS INTO THE LUNGS DAILY 09/10/20   Tanda Rockers, MD  traMADol (ULTRAM) 50 MG tablet Take 1 tablet (50 mg total) by mouth every 6 (six) hours as needed. 12/16/19   Parrett, Fonnie Mu, NP  valACYclovir (VALTREX) 1000 MG tablet Take 1,000 mg by mouth 2 (two) times daily.    [provider]      Allergies    Patient has no known allergies.    Review of Systems   Review of Systems  All other systems reviewed and are negative.   Physical Exam Updated Vital Signs BP 102/85 (BP Location: Right Arm)   Pulse (!) 102   Temp 98.6 F (37 C) (Oral)   Resp 16   Wt 68 kg   SpO2 96%   BMI 29.28 kg/m  Physical Exam Vitals and nursing note reviewed.  Constitutional:      General: She is not in acute distress.    Appearance: Normal appearance. She is not ill-appearing or toxic-appearing.  HENT:     Head: Normocephalic and atraumatic.     Mouth/Throat:     Mouth: Mucous membranes are moist.  Eyes:     Conjunctiva/sclera: Conjunctivae normal.  Cardiovascular:     Rate and Rhythm: Normal rate and regular rhythm.     Heart sounds: No murmur heard. Pulmonary:     Effort: Pulmonary effort is normal.     Breath sounds: Normal breath sounds.  Abdominal:     General: Abdomen is flat. There is no distension.     Palpations: Abdomen is soft.     Tenderness: There is abdominal tenderness (moderate  suprapubic). There is no right CVA tenderness, left CVA tenderness, guarding or rebound.  Musculoskeletal:        General: Normal range of motion.     Cervical back: Normal range of motion and neck supple.     Right lower leg: No edema.     Left lower leg: No edema.  Skin:    General: Skin is warm and dry.     Capillary Refill: Capillary refill takes less than 2 seconds.  Neurological:     Mental Status: She is alert. Mental status is at baseline.  Psychiatric:        Behavior: Behavior normal.     ED Results / Procedures / Treatments   Labs (all labs ordered are listed, but only abnormal results are displayed) Labs Reviewed  URINALYSIS, ROUTINE W REFLEX MICROSCOPIC - Abnormal; Notable for the following components:      Result Value   APPearance CLOUDY (*)    Hgb urine dipstick LARGE (*)    Protein, ur 100 (*)    Nitrite POSITIVE (*)    Leukocytes,Ua LARGE (*)    Bacteria, UA MANY (*)    All other components within normal limits  CBC WITH DIFFERENTIAL/PLATELET - Abnormal; Notable for the following components:   Neutro Abs 8.0 (*)    All other components within normal limits  COMPREHENSIVE METABOLIC PANEL - Abnormal; Notable for the following components:   Glucose, Bld 109 (*)    All other components within normal limits  URINE CULTURE    EKG None  Radiology No results found.  Procedures Procedures  Postvoid bladder scan 10 to 15 mL  Medications Ordered in ED Medications - No data to display  ED Course/ Medical Decision Making/ A&P                             Medical Decision Making Amount and/or Complexity of Data Reviewed Labs: ordered. Decision-making details documented in ED Course.  Risk Prescription drug management.   Medical Decision Making:   Brittany Sparks is a 57 y.o. female who presented to the ED today with abdominal pain detailed above.    Patient's presentation is complicated by their history of urinary retention, currently self  catheterizing urine.  Complete initial physical exam performed, notably the patient was in no acute distress but with moderate suprapubic abdominal tenderness.  Abdomen was soft.  Remainder of abdomen is nontender.  No CVA tenderness bilaterally.  Normal heart and lung exam.  No abdominal distention, rebound, guarding, or peritoneal signs.  Vital signs stable and patient afebrile with normal blood pressure. Reviewed and confirmed nursing documentation for past  medical history, family history, social history.    Initial Assessment:   With the patient's presentation of abdominal pain, most likely diagnosis is acute cystitis. The differential diagnosis of abdominal pain includes but is not limited to appendicitis, diverticulitis, DKA, gastritis, gastroenteritis, nephrolithiasis, pancreatitis, peritonitis, constipation, UTI, SBO/LBO, pyelonephritis.   This is most consistent with an acute complicated illness  Initial Plan:  Screening labs including CBC and Metabolic panel to evaluate for infectious or metabolic etiology of disease.  Urinalysis with reflex culture ordered to evaluate for UTI or relevant urologic/nephrologic pathology.  Catheterization education by nursing staff Objective evaluation as reviewed   Initial Study Results:   Laboratory  All laboratory results reviewed without evidence of clinically relevant pathology.   Exceptions include: UA positive for hematuria, large leukocytes, nitrite positive  Final Assessment and Plan:   This is a 57 year old female presenting to the ED for suprapubic pain, bilateral flank pain, subjective fever, and chills.  She has a history of urinary retention and began self catheterizing this past week.  She reports that she does not feel that she was well-educated and is worried that she is not doing the procedure and the correct sterile manner and this has introduce bacteria into her bladder.  She denies recent urinary tract infection or recent antibiotic  use.  On exam, she has moderate suprapubic tenderness but abdomen is soft and otherwise nontender with no CVA tenderness.  She is afebrile here with stable vital signs.  Workup obtained as above for further evaluation.  Patient does have UA which is positive for leukocytes, nitrate positive, and with hematuria.  With self-catheterization at home, will treat for complicated urinary tract infection.  Patient appears stable and does not seem to be systemically ill does not require hospitalization at this point but extensive discussion with patient on need for close outpatient follow-up with urology.  No leukocytosis and overall reassuring blood work. Catheter education provided by nursing staff in ED today. If she is unable to follow-up with her urologist, I provided the information for the on-call urologist that she may also set up a follow-up appointment with if she continues to have symptoms.  Strict ED return precautions given, all questions answered, and stable for discharge.   Clinical Impression:  1. Acute cystitis with hematuria      Data Unavailable           Final Clinical Impression(s) / ED Diagnoses Final diagnoses:  Acute cystitis with hematuria    Rx / DC Orders ED Discharge Orders          Ordered    ciprofloxacin (CIPRO) 500 MG tablet  2 times daily        09/16/22 1255              Suzzette Righter, PA-C 09/16/22 1328    Isla Pence, MD 09/16/22 1523

## 2022-09-16 NOTE — ED Triage Notes (Signed)
C/o chills, odor to urine with amber in color, bilateral flank pain x1 day.  Pt reports starting to self cath on Friday.

## 2022-09-16 NOTE — Discharge Instructions (Signed)
Thank you for letting us take care of you today.  You do have a urinary tract infection on today's workup but your blood work is reassuring. As you use a catheter, I am sending you home with an antibiotic to cover for a complicated urinary tract infection. Please take these as prescribed. I want you to follow up with urology to make sure the infection clears and/or if you have worsening symptoms. If unable to follow up with your urologist, I have provided additional urology information above for Dr. Louis Meckel who you may also follow up with as needed. Your PCP could also recheck your urine in the office to make sure infection has cleared. For any new or worsening, emergent symptoms, please return to nearest emergency department for re-evaluation.

## 2022-09-16 NOTE — ED Notes (Signed)
Bladder scan revealed 15 mls of fluid

## 2022-09-19 LAB — URINE CULTURE: Culture: >=100000 — AB

## 2022-09-20 ENCOUNTER — Telehealth (HOSPITAL_BASED_OUTPATIENT_CLINIC_OR_DEPARTMENT_OTHER): Payer: Self-pay | Admitting: *Deleted

## 2022-09-20 NOTE — Telephone Encounter (Signed)
Post ED Visit - Positive Culture Follow-up  Culture report reviewed by antimicrobial stewardship pharmacist: [place] []  Elenor Quinones, Pharm.D. []  Heide Guile, Pharm.D., BCPS AQ-ID []  Parks Neptune, Pharm.D., BCPS []  Alycia Rossetti, Pharm.D., BCPS []  Kelly, Pharm.D., BCPS, AAHIVP []  Legrand Como, Pharm.D., BCPS, AAHIVP []  Salome Arnt, PharmD, BCPS []  Johnnette Gourd, PharmD, BCPS []  Hughes Better, PharmD, BCPS []  Leeroy Cha, PharmD []  Laqueta Linden, PharmD, BCPS []  Albertina Parr, PharmD  [place] Team []  Leodis Sias, PharmD []  Lindell Spar, PharmD []  Royetta Asal, PharmD []  Graylin Shiver, Rph []  Rema Fendt) Glennon Mac, PharmD []  Arlyn Dunning, PharmD []  Netta Cedars, PharmD []  Dia Sitter, PharmD []  Leone Haven, PharmD []  Gretta Arab, PharmD []  Theodis Shove, PharmD []  Peggyann Juba, PharmD [x]  R. Absher, PharmD   Positive urine culture Treated with Ciprofloxacin, organism sensitive to the same and no further patient follow-up is required at this time.  Rosie Fate 09/20/2022, 7:27 AM

## 2022-09-21 ENCOUNTER — Emergency Department (HOSPITAL_COMMUNITY)
Admission: EM | Admit: 2022-09-21 | Discharge: 2022-09-21 | Disposition: A | Discharge status: Home / Self Care | Payer: Medicaid Other | Attending: Emergency Medicine | Admitting: Emergency Medicine

## 2022-09-21 ENCOUNTER — Other Ambulatory Visit: Payer: Self-pay

## 2022-09-21 ENCOUNTER — Encounter (HOSPITAL_COMMUNITY): Payer: Self-pay

## 2022-09-21 DIAGNOSIS — I10 Essential (primary) hypertension: Secondary | ICD-10-CM | POA: Insufficient documentation

## 2022-09-21 DIAGNOSIS — Z7951 Long term (current) use of inhaled steroids: Secondary | ICD-10-CM | POA: Diagnosis not present

## 2022-09-21 DIAGNOSIS — J449 Chronic obstructive pulmonary disease, unspecified: Secondary | ICD-10-CM | POA: Diagnosis not present

## 2022-09-21 DIAGNOSIS — Z79899 Other long term (current) drug therapy: Secondary | ICD-10-CM | POA: Insufficient documentation

## 2022-09-21 DIAGNOSIS — R339 Retention of urine, unspecified: Secondary | ICD-10-CM | POA: Diagnosis present

## 2022-09-21 MED ORDER — SILASTIC FOLEY CATHETER MISC: 0 refills | Status: AC

## 2022-09-21 NOTE — Discharge Instructions (Signed)
I have written for some new foley catheters. Continue to cath as instructed by urology. Make sure to finish all your antibiotics. Follow-up with urology. Return here for new concerns.

## 2022-09-21 NOTE — ED Provider Notes (Signed)
[place] EMERGENCY DEPARTMENT AT [facility] Provider Note   CSN: [id number] Arrival date & time: 09/21/22  0403     History  Chief Complaint  Patient presents with   Urinary Retention    Brittany Sparks is a 57 y.o. female.  The history is provided by the patient and medical records.   57 y.o. F with hx of HTN, COPD, obesity, presenting to the ED for needing catheter supplies.  Patient follows closely with urology, due to urodynamic testing soon to see why she continues to have urinary retention.  She states she was given catheters but has since run out.  She was seen here 5 days ago and diagnosed with UTI, states she is feeling much better from that standpoint.  States today she simply needs catheter supplies as she was not able to get them from medical supply without prescription. No fever, abdominal pain, vomiting, flank pain.  Home Medications Prior to Admission medications   Medication Sig Start Date End Date Taking? Authorizing Provider  amLODipine (NORVASC) 5 MG tablet Take 5 mg by mouth daily.   Yes [provider]  Catheters (SILASTIC FOLEY CATHETER) [place] Catheters for home self cath up to 4 times daily. 09/21/22  Yes Larene Pickett, PA-C  ciprofloxacin (CIPRO) 500 MG tablet Take 1 tablet (500 mg total) by mouth 2 (two) times daily for 10 days. 09/16/22 09/26/22 Yes Gowens, Mariah L, PA-C  famotidine (PEPCID) 20 MG tablet One at bedtime Patient taking differently: Take 20 mg by mouth at bedtime. One at bedtime 02/24/18  Yes Tanda Rockers, MD  gabapentin (NEURONTIN) 100 MG capsule TAKE 1 CAPSULE(100 MG) BY MOUTH TWICE DAILY 02/12/21  Yes Tanda Rockers, MD  olmesartan (BENICAR) 40 MG tablet Take 40 mg by mouth daily.   Yes [provider]  pantoprazole (PROTONIX) 40 MG tablet TAKE 1 TABLET(40 MG) BY MOUTH DAILY 30 TO 60 MINUTES BEFORE FIRST MEAL OF THE DAY 10/06/18  Yes Tanda Rockers, MD  simvastatin (ZOCOR) 10 MG tablet Take 10 mg by mouth at  bedtime.   Yes [provider]  SPIRIVA RESPIMAT 2.5 MCG/ACT AERS INHALE 2 PUFFS INTO THE LUNGS DAILY 09/10/20  Yes Tanda Rockers, MD  valACYclovir (VALTREX) 1000 MG tablet Take 1,000 mg by mouth 2 (two) times daily.   Yes [provider]  budesonide-formoterol (SYMBICORT) 80-4.5 MCG/ACT inhaler Inhale 2 puffs into the lungs 2 (two) times daily. Patient not taking: Reported on 09/21/2022 10/22/18   Tanda Rockers, MD  ID NOW COVID-19 KIT See admin instructions. 12/01/19   [provider]  traMADol (ULTRAM) 50 MG tablet Take 1 tablet (50 mg total) by mouth every 6 (six) hours as needed. Patient not taking: Reported on 09/21/2022 12/16/19   Parrett, Fonnie Mu, NP      Allergies    Patient has no known allergies.    Review of Systems   Review of Systems  Genitourinary:  Positive for difficulty urinating.  All other systems reviewed and are negative.   Physical Exam Updated Vital Signs BP 110/79 (BP Location: Right Arm)   Pulse 73   Temp 98.2 F (36.8 C) (Oral)   Resp 18   Ht 5' (1.524 m)   Wt 71.2 kg   SpO2 95%   BMI 30.66 kg/m   Physical Exam Vitals and nursing note reviewed.  Constitutional:      Appearance: She is well-developed.     Comments: Comfortable appearing, NAD  HENT:  Head: Normocephalic and atraumatic.  Eyes:     Conjunctiva/sclera: Conjunctivae normal.     Pupils: Pupils are equal, round, and reactive to light.  Cardiovascular:     Rate and Rhythm: Normal rate and regular rhythm.     Heart sounds: Normal heart sounds.  Pulmonary:     Effort: Pulmonary effort is normal.     Breath sounds: Normal breath sounds.  Abdominal:     General: Bowel sounds are normal.     Palpations: Abdomen is soft.  Musculoskeletal:        General: Normal range of motion.     Cervical back: Normal range of motion.  Skin:    General: Skin is warm and dry.  Neurological:     Mental Status: She is alert and oriented to person, place, and time.      ED Results / Procedures / Treatments   Labs (all labs ordered are listed, but only abnormal results are displayed) Labs Reviewed - No data to display  EKG None  Radiology No results found.  Procedures Procedures    Medications Ordered in ED Medications - No data to display  ED Course/ Medical Decision Making/ A&P                             Medical Decision Making Risk OTC drugs.  57 y.o. F here with urinary retention.  Self caths and has ran out of home supplies.  Recently seen for UTI, started on Ciprofloxacin and reports those symptoms have pretty much resolved.   She is afebrile, non-toxic.  She appears comfortable, NAD.  VSS.  Abdomen soft, non-tender, no CVA tenderness.  Cath performed here in the ED.  Given 2 foley catheters from ED and written prescription for new supplies.  She will need to continue her antibiotics until finished.  Close follow-up with urology encouraged-- states she has appt this week.  Can return here for new concerns.  Final Clinical Impression(s) / ED Diagnoses Final diagnoses:  Urinary retention    Rx / DC Orders ED Discharge Orders          Ordered    Catheters (SILASTIC FOLEY CATHETER) MISC        09/21/22 [place]              Larene Pickett, PA-C 09/21/22 0550    Palumbo, April, MD 09/21/22 [phone]

## 2022-09-21 NOTE — ED Triage Notes (Signed)
Patient reports she self cath's and ran out of supplies. States she was here for UTI and received abx a few days ago. Last cath was yesterday.

## 2022-10-22 ENCOUNTER — Telehealth: Payer: Self-pay | Admitting: Internal Medicine

## 2022-10-22 NOTE — Telephone Encounter (Signed)
Pt scheduled for overdue f/u with [name] on 5/17. LOV 12/16/19. On call, pt requested to switch providers if possible. Please advise if switching from [facility] to another provider is okay.

## 2022-10-22 NOTE — Telephone Encounter (Signed)
Fine with me

## 2022-10-22 NOTE — Telephone Encounter (Signed)
Patient is wanting to switch providers if possible. Pt was seeing Dr Sherene Sires for COPD. .  Dr Sherene Sires are you ok with the change to Dr Judeth Horn for COPD  Dr Judeth Horn are you are with the change  Thank you

## 2022-10-24 NOTE — Telephone Encounter (Signed)
Called pt. Back and LVM to callback put note in apt with [name] that she need new pt. Apt with Hunsucker

## 2022-10-24 NOTE — Telephone Encounter (Signed)
Attempted to call pt to schedule a new pt appt with Dr.Hunsucker but unable to reach. Left her a detailed message to call the office to schedule a new pt appt with Dr. Judeth Horn.   Also routing to front desk pool for help with getting this appt scheduled for pt.

## 2022-11-21 ENCOUNTER — Encounter: Payer: Self-pay | Admitting: Adult Health

## 2022-11-21 ENCOUNTER — Ambulatory Visit: Payer: Medicaid Other | Admitting: Adult Health

## 2022-11-21 ENCOUNTER — Ambulatory Visit (INDEPENDENT_AMBULATORY_CARE_PROVIDER_SITE_OTHER): Payer: Medicaid Other

## 2022-11-21 VITALS — BP 114/60 | HR 100 | Temp 98.3°F | Ht 60.0 in | Wt 157.4 lb

## 2022-11-21 DIAGNOSIS — J449 Chronic obstructive pulmonary disease, unspecified: Secondary | ICD-10-CM | POA: Diagnosis not present

## 2022-11-21 DIAGNOSIS — R058 Other specified cough: Secondary | ICD-10-CM | POA: Diagnosis not present

## 2022-11-21 DIAGNOSIS — R053 Chronic cough: Secondary | ICD-10-CM

## 2022-11-21 MED ORDER — BREZTRI AEROSPHERE 160-9-4.8 MCG/ACT IN AERO: 2.0000 | INHALATION_SPRAY | Freq: Two times a day (BID) | RESPIRATORY_TRACT | 0 refills | Status: DC

## 2022-11-21 MED ORDER — HYDROCODONE BIT-HOMATROP MBR 5-1.5 MG/5ML PO SOLN: 5.0000 mL | Freq: Four times a day (QID) | ORAL | 0 refills | Status: AC | PRN

## 2022-11-21 MED ORDER — PREDNISONE 10 MG PO TABS: ORAL_TABLET | ORAL | 0 refills | Status: DC

## 2022-11-21 MED ORDER — BREZTRI AEROSPHERE 160-9-4.8 MCG/ACT IN AERO: 2.0000 | INHALATION_SPRAY | Freq: Two times a day (BID) | RESPIRATORY_TRACT | 5 refills | Status: DC

## 2022-11-21 NOTE — Assessment & Plan Note (Addendum)
Possible COPD flare.-Will increase maintenance regimen from Spiriva to [facility] to see if this helps.  Chest x-ray is pending. Check PFTs on return  Plan  Patient Instructions  Chest xray today .  Set up for HRCT chest .  Stop Spiriva , Try Breztri 2 puffs Twice daily  , rinse after use.  Albuterol inhaler As needed   Begin Delsym 2 tsp Twice daily  for cough , As needed   Begin Chlorpheniramine 4mg   At bedtime  (Chlor tabs) As needed  drainage Begin Allegra 180mg  daily in am .   Hydromet 1 tsp every 6hr for severe cough, may make you sleepy. Use with caution.  Prednisone taper over next week.  Continue on Neurontin Twice daily   Continue on Protonix and Pepcid  Sips of water to soothe throat , avoid throat clearing . No mints products.  Follow up with in 4-6 weeks with PFT with Dr. Judeth Horn and As needed  (new patient visit) .  Please contact office for sooner follow up if symptoms do not improve or worsen or seek emergency care

## 2022-11-21 NOTE — Assessment & Plan Note (Signed)
Chronic cough-questionable etiology may be a component of postnasal drainage and reflux.  Recent flare.  Will treat with a short course of steroids.  Cough trial regimen.  Continue with reflux regimen.  Check high-resolution CT chest to rule out interstitial process. Check PFTs on return  Plan  Patient Instructions  Chest xray today .  Set up for HRCT chest .  Stop Spiriva , Try Breztri 2 puffs Twice daily  , rinse after use.  Albuterol inhaler As needed   Begin Delsym 2 tsp Twice daily  for cough , As needed   Begin Chlorpheniramine 4mg   At bedtime  (Chlor tabs) As needed  drainage Begin Allegra 180mg  daily in am .   Hydromet 1 tsp every 6hr for severe cough, may make you sleepy. Use with caution.  Prednisone taper over next week.  Continue on Neurontin Twice daily   Continue on Protonix and Pepcid  Sips of water to soothe throat , avoid throat clearing . No mints products.  Follow up with in 4-6 weeks with PFT with Dr. Judeth Horn and As needed  (new patient visit) .  Please contact office for sooner follow up if symptoms do not improve or worsen or seek emergency care

## 2022-11-21 NOTE — Patient Instructions (Addendum)
Chest xray today .  Set up for HRCT chest .  Stop Spiriva , Try Breztri 2 puffs Twice daily  , rinse after use.  Albuterol inhaler As needed   Begin Delsym 2 tsp Twice daily  for cough , As needed   Begin Chlorpheniramine 4mg   At bedtime  (Chlor tabs) As needed  drainage Begin Allegra 180mg  daily in am .   Hydromet 1 tsp every 6hr for severe cough, may make you sleepy. Use with caution.  Prednisone taper over next week.  Continue on Neurontin Twice daily   Continue on Protonix and Pepcid  Sips of water to soothe throat , avoid throat clearing . No mints products.  Follow up with in 4-6 weeks with PFT with Dr. Judeth Horn and As needed  (new patient visit) .  Please contact office for sooner follow up if symptoms do not improve or worsen or seek emergency care

## 2022-11-21 NOTE — Progress Notes (Signed)
@Patient  ID: Brittany Sparks, female    DOB: Aug 07, 1965, 57 y.o.   MRN: [id number]  Chief Complaint  Patient presents with   Follow-up    Referring provider: Shelbie Proctor  HPI: 57 year old female former smoker followed for moderate COPD  TEST/EVENTS :  PFTs 07/29/2012 FEV1 1.39 (61%) Ratio 60 and no better p B2 DLCO 55% > 114%  Spirometry 03/12/2018  FEV1 1.1 (56%)  Ratio 61 classic curvature, nothing prior    11/21/2022 Acute OV : Cough , COPD  Patient presents for an acute office visit.  Last seen December 16, 2019 she complains of increased cough over the last 3 months.  Patient has a history of chronic cough.  This has been going on for years.  Has been on gabapentin 100 mg twice daily which has helped with her cough.  Previously has used tramadol in the past for severe coughing episodes. Lately cough has been getting progressively worse-minimally productive.  Denies any fever or discolored mucus.  She has moderate COPD.  Previously on Symbicort and Spiriva but stopped taking Symbicort about 1 year ago.  She has no hemoptysis or unintentional weight loss.  No edema.  Does have some postnasal drainage.  Remains on Protonix and Pepcid for possible GERD component.  Denies any significant reflux.  Has not been using any over-the-counter cough medicines.  Says she just work in a dusty environment.  She works with a [employer].  Has no pets at home.  No exposure to birds or chickens.  Does not have a hot tub or basement.  Denies any rash or joint swelling. Chest x-ray August 06, 2021 showed clear lungs.  No Known Allergies  Immunization History  Administered Date(s) Administered   Influenza Split 05/14/2011, 04/01/2012   Influenza Whole 06/23/2011, 04/12/2012   Influenza, Quadrivalent, Recombinant, Inj, Pf 07/28/2017   Influenza,trivalent, recombinat, inj, PF 05/14/2011, 04/01/2012   Pneumococcal Polysaccharide-23 04/07/2011, 05/14/2011   Pneumococcal-Unspecified 05/14/2011   Tdap  05/14/2011   Zoster Recombinat (Shingrix) 04/08/2021    Past Medical History:  Diagnosis Date   Asthma    Constipation    COPD (chronic obstructive pulmonary disease) (HCC)    Drug addiction (HCC)    GERD (gastroesophageal reflux disease)    Hepatitis C    Herpes simplex type 2 infection    Hyperlipidemia    Hypertension    Recurrent mouth ulceration    SBO (small bowel obstruction) (HCC)    2014    Tobacco History: Social History   Tobacco Use  Smoking Status Former   Packs/day: 0.50   Years: 30.00   Additional pack years: 0.00   Total pack years: 15.00   Types: Cigarettes   Quit date: 11/05/2006   Years since quitting: 16.0  Smokeless Tobacco Never   Counseling given: Not Answered   Outpatient Medications Prior to Visit  Medication Sig Dispense Refill   albuterol (VENTOLIN HFA) 108 (90 Base) MCG/ACT inhaler Inhale 1-2 puffs into the lungs every 6 (six) hours as needed.     amLODipine (NORVASC) 5 MG tablet Take 5 mg by mouth daily.     Catheters (SILASTIC FOLEY CATHETER) MISC Catheters for home self cath up to 4 times daily. 20 each 0   famotidine (PEPCID) 20 MG tablet One at bedtime (Patient taking differently: Take 20 mg by mouth at bedtime. One at bedtime) 30 tablet 11   gabapentin (NEURONTIN) 100 MG capsule TAKE 1 CAPSULE(100 MG) BY MOUTH TWICE DAILY 180 capsule 1   olmesartan (  BENICAR) 40 MG tablet Take 40 mg by mouth daily.     pantoprazole (PROTONIX) 40 MG tablet TAKE 1 TABLET(40 MG) BY MOUTH DAILY 30 TO 60 MINUTES BEFORE FIRST MEAL OF THE DAY 30 tablet 2   simvastatin (ZOCOR) 10 MG tablet Take 10 mg by mouth at bedtime.     valACYclovir (VALTREX) 1000 MG tablet Take 1,000 mg by mouth 2 (two) times daily.     SPIRIVA RESPIMAT 2.5 MCG/ACT AERS INHALE 2 PUFFS INTO THE LUNGS DAILY 12 g 3   ID NOW COVID-19 KIT See admin instructions. (Patient not taking: Reported on 11/21/2022)     budesonide-formoterol (SYMBICORT) 80-4.5 MCG/ACT inhaler Inhale 2 puffs into the  lungs 2 (two) times daily. (Patient not taking: Reported on 11/21/2022) 1 Inhaler 11   traMADol (ULTRAM) 50 MG tablet Take 1 tablet (50 mg total) by mouth every 6 (six) hours as needed. (Patient not taking: Reported on 09/21/2022) 20 tablet 0   No facility-administered medications prior to visit.     Review of Systems:   Constitutional:   No  weight loss, night sweats,  Fevers, chills, fatigue, or  lassitude.  HEENT:   No headaches,  Difficulty swallowing,  Tooth/dental problems, or  Sore throat,                No sneezing, itching, ear ache,  +nasal congestion, post nasal drip,   CV:  No chest pain,  Orthopnea, PND, swelling in lower extremities, anasarca, dizziness, palpitations, syncope.   GI  No heartburn, indigestion, abdominal pain, nausea, vomiting, diarrhea, change in bowel habits, loss of appetite, bloody stools.   Resp: .  No chest wall deformity  Skin: no rash or lesions.  GU: no dysuria, change in color of urine, no urgency or frequency.  No flank pain, no hematuria   MS:  No joint pain or swelling.  No decreased range of motion.  No back pain.    Physical Exam  BP 114/60 (BP Location: Left Arm, Patient Position: Sitting, Cuff Size: Normal)   Pulse 100   Temp 98.3 F (36.8 C) (Oral)   Ht 5' (1.524 m)   Wt 157 lb 6.4 oz (71.4 kg)   SpO2 93%   BMI 30.74 kg/m   GEN: A/Ox3; pleasant , NAD, well nourished    HEENT:  [place]/AT,  NOSE-clear, THROAT-clear, no lesions, no postnasal drip or exudate noted.   NECK:  Supple w/ fair ROM; no JVD; normal carotid impulses w/o bruits; no thyromegaly or nodules palpated; no lymphadenopathy.    RESP  Clear  P & A; w/o, wheezes/ rales/ or rhonchi. no accessory muscle use, no dullness to percussion  CARD:  RRR, no m/r/g, no peripheral edema, pulses intact, no cyanosis or clubbing.  GI:   Soft & nt; nml bowel sounds; no organomegaly or masses detected.   Musco: Warm bil, no deformities or joint swelling noted.   Neuro: alert, no  focal deficits noted.    Skin: Warm, no lesions or rashes    Lab Results:  CBC    Component Value Date/Time   WBC 10.5 09/16/2022 1146   RBC 4.14 09/16/2022 1146   HGB 12.6 09/16/2022 1146   HCT 40.4 09/16/2022 1146   PLT 291 09/16/2022 1146   MCV 97.6 09/16/2022 1146   MCH 30.4 09/16/2022 1146   MCHC 31.2 09/16/2022 1146   RDW 12.4 09/16/2022 1146   LYMPHSABS 1.6 09/16/2022 1146   MONOABS 0.8 09/16/2022 1146   EOSABS 0.0 09/16/2022 1146  BASOSABS 0.0 09/16/2022 1146    BMET    Component Value Date/Time   NA 140 09/16/2022 1146   K 4.1 09/16/2022 1146   CL 102 09/16/2022 1146   CO2 27 09/16/2022 1146   GLUCOSE 109 (H) 09/16/2022 1146   BUN 12 09/16/2022 1146   CREATININE 0.99 09/16/2022 1146   CALCIUM 9.2 09/16/2022 1146   GFRNONAA >60 09/16/2022 1146   GFRAA >60 07/26/2018 1239    BNP No results found for: "BNP"  ProBNP    Component Value Date/Time   PROBNP 10.2 02/07/2013 1513    Imaging: DG Chest 2 View  Result Date: 11/21/2022 CLINICAL DATA:  copd flare EXAM: CHEST - 2 VIEW COMPARISON:  08/06/2021 FINDINGS: Lungs are clear. Heart size and mediastinal contours are within normal limits. No effusion. Visualized bones unremarkable.  Left upper quadrant surgical clips. IMPRESSION: No acute cardiopulmonary disease. Electronically Signed   By: Corlis Leak M.D.   On: 11/21/2022 12:19          No data to display          Lab Results  Component Value Date   NITRICOXIDE 7 03/12/2018        Assessment & Plan:   COPD GOLD II Possible COPD flare.-Will increase maintenance regimen from Spiriva to [facility] to see if this helps.  Chest x-ray is pending. Check PFTs on return  Plan  Patient Instructions  Chest xray today .  Set up for HRCT chest .  Stop Spiriva , Try Breztri 2 puffs Twice daily  , rinse after use.  Albuterol inhaler As needed   Begin Delsym 2 tsp Twice daily  for cough , As needed   Begin Chlorpheniramine 4mg   At bedtime  (Chlor  tabs) As needed  drainage Begin Allegra 180mg  daily in am .   Hydromet 1 tsp every 6hr for severe cough, may make you sleepy. Use with caution.  Prednisone taper over next week.  Continue on Neurontin Twice daily   Continue on Protonix and Pepcid  Sips of water to soothe throat , avoid throat clearing . No mints products.  Follow up with in 4-6 weeks with PFT with Dr. Judeth Horn and As needed  (new patient visit) .  Please contact office for sooner follow up if symptoms do not improve or worsen or seek emergency care      Upper airway cough syndrome Chronic cough-questionable etiology may be a component of postnasal drainage and reflux.  Recent flare.  Will treat with a short course of steroids.  Cough trial regimen.  Continue with reflux regimen.  Check high-resolution CT chest to rule out interstitial process. Check PFTs on return  Plan  Patient Instructions  Chest xray today .  Set up for HRCT chest .  Stop Spiriva , Try Breztri 2 puffs Twice daily  , rinse after use.  Albuterol inhaler As needed   Begin Delsym 2 tsp Twice daily  for cough , As needed   Begin Chlorpheniramine 4mg   At bedtime  (Chlor tabs) As needed  drainage Begin Allegra 180mg  daily in am .   Hydromet 1 tsp every 6hr for severe cough, may make you sleepy. Use with caution.  Prednisone taper over next week.  Continue on Neurontin Twice daily   Continue on Protonix and Pepcid  Sips of water to soothe throat , avoid throat clearing . No mints products.  Follow up with in 4-6 weeks with PFT with Dr. Judeth Horn and As needed  (new patient  visit) .  Please contact office for sooner follow up if symptoms do not improve or worsen or seek emergency care        Rubye Oaks, NP 11/21/2022

## 2022-11-24 ENCOUNTER — Telehealth: Payer: Self-pay

## 2022-11-24 NOTE — Telephone Encounter (Signed)
PA request received via CMM for Breztri Aerosphere 160-9-4.8MCG/ACT aerosol  Patient previously on Symbicort and Spiriva *no notation of failure of these medications.  Pa will need to be processed through [facility] Tracks if appropriate.

## 2022-11-25 NOTE — Telephone Encounter (Signed)
Routing to [name] for review. Please advise.

## 2022-11-25 NOTE — Telephone Encounter (Signed)
She was taken off Symbicort, said it was not working . Recently started back on Spiriva , not working at visit. So added Breztri.

## 2022-11-26 NOTE — Telephone Encounter (Signed)
Please see message below from [name]. Patient has tried and failed both Symbicort and Spiriva   Please route back to triage

## 2022-12-02 ENCOUNTER — Other Ambulatory Visit (HOSPITAL_COMMUNITY): Payer: Self-pay

## 2022-12-05 NOTE — Telephone Encounter (Signed)
PA has been submitted via [place] Tracks and is pending determination  Confirmation: [id number]

## 2022-12-08 NOTE — Telephone Encounter (Signed)
PA has been DENIED, pending denial letter to populate in [facility]

## 2022-12-11 ENCOUNTER — Ambulatory Visit (HOSPITAL_COMMUNITY): Payer: Medicaid Other

## 2022-12-12 ENCOUNTER — Telehealth: Payer: Self-pay | Admitting: Adult Health

## 2022-12-12 MED ORDER — BREZTRI AEROSPHERE 160-9-4.8 MCG/ACT IN AERO: 2.0000 | INHALATION_SPRAY | Freq: Two times a day (BID) | RESPIRATORY_TRACT | 0 refills | Status: DC

## 2022-12-12 NOTE — Telephone Encounter (Signed)
Spoke to pt she stated that her insurance will not pay for Budeson-Glycopyrrol-Formoterol (BREZTRI AEROSPHERE) 160-9-4.8 MCG/ACT AERO and she's out of samples. I informed her I will leave samples of breztri at front desk for her. And I will send our prior auth team a message to check brezti for PA. Pt verbalized understanding. Nothing further needed as of now.

## 2022-12-15 NOTE — Telephone Encounter (Signed)
Denial letter attached in patients media.

## 2022-12-15 NOTE — Telephone Encounter (Signed)
Denial letter has been attached in patients documents, appeal information is included if appropriate.

## 2022-12-19 ENCOUNTER — Ambulatory Visit (HOSPITAL_COMMUNITY)
Admission: RE | Admit: 2022-12-19 | Discharge: 2022-12-19 | Disposition: A | Discharge status: Home / Self Care | Payer: Medicaid Other | Source: Ambulatory Visit | Attending: Adult Health | Admitting: Adult Health

## 2022-12-19 DIAGNOSIS — R053 Chronic cough: Secondary | ICD-10-CM | POA: Diagnosis present

## 2022-12-19 DIAGNOSIS — J449 Chronic obstructive pulmonary disease, unspecified: Secondary | ICD-10-CM | POA: Diagnosis present

## 2022-12-26 ENCOUNTER — Encounter: Payer: Self-pay | Admitting: Pulmonary Disease

## 2022-12-29 NOTE — Telephone Encounter (Signed)
Pt is inquiring via mychart about ct scan results. Please advise when you get a chance. Thanks !

## 2023-01-01 NOTE — Telephone Encounter (Signed)
Please see CT chest results  CT chest is negative for interstitial lung disease/pulmonary fibrosis .  Does show some scarring in the right lung base. Bronchitic changes. Will discuss in full detail at follow-up visit.  Continue with office visit recommendations and follow-up   Incidental findings were atherosclerosis.  Please discuss with primary care provider if ongoing evaluation is indicated.

## 2023-01-02 NOTE — Progress Notes (Signed)
Called and spoke with patient, advised of results/recommendations per [name] Parrett NP.  She verbalized understanding.  Nothing further needed.

## 2023-01-09 ENCOUNTER — Encounter (HOSPITAL_BASED_OUTPATIENT_CLINIC_OR_DEPARTMENT_OTHER): Payer: Medicaid Other

## 2023-01-20 ENCOUNTER — Ambulatory Visit: Payer: Medicaid Other | Admitting: Pulmonary Disease

## 2023-02-12 ENCOUNTER — Telehealth: Payer: Self-pay | Admitting: Pulmonary Disease

## 2023-02-12 NOTE — Telephone Encounter (Signed)
Pt is wanting samples of the Breztri, Pt is switching ins and will be eff 09/01, Pt will need a assistance form for [facility] as well.

## 2023-02-17 NOTE — Telephone Encounter (Signed)
Patient states having shortness of breath. Out of medication. Checking on samples of Breztri. Patient phone number is [phone].

## 2023-02-17 NOTE — Telephone Encounter (Signed)
Since this has been open since 02/12/2023. Patient is upset. International aid/development worker. I sent a note back to [place]. Patient hung up before I could tell her.

## 2023-02-17 NOTE — Telephone Encounter (Signed)
Patient checking on message for Breztri samples. Patient phone number is [phone].

## 2023-02-18 MED ORDER — TRELEGY ELLIPTA 100-62.5-25 MCG/ACT IN AEPB: 1.0000 | INHALATION_SPRAY | Freq: Every day | RESPIRATORY_TRACT | 3 refills | Status: AC

## 2023-02-18 NOTE — Telephone Encounter (Addendum)
Per chart patient last given samples of Breztri in June. Patient has no-showed and canceled NP w/[place], currently scheduled 04/29/23.   Spoke with patient and she states she has new insurance with [employer]. Advised patient we do not have this information, requested she send a copy of her card via MyChart. Patient agreed.   Advised patient I will send Breztri to [facility], she will need to call them to make sure they also have her new ins information. She states she will contact them. If rx needs PA pharmacy will contact us. At that time can provide samples if needed. Patient aware.   Rx sent.

## 2023-02-20 ENCOUNTER — Encounter: Payer: Self-pay | Admitting: Pulmonary Disease

## 2023-02-20 NOTE — Telephone Encounter (Signed)
See last signed encounter. PT, upon req, has sent in her ins card. The [facility] is still over 600.00 and she wonders if we can leave some samples for her. Pls call to advise @ [phone]

## 2023-02-21 ENCOUNTER — Emergency Department (HOSPITAL_COMMUNITY)
Admission: EM | Admit: 2023-02-21 | Discharge: 2023-02-21 | Disposition: A | Discharge status: Home / Self Care | Payer: Commercial Managed Care - HMO | Attending: Emergency Medicine | Admitting: Emergency Medicine

## 2023-02-21 ENCOUNTER — Emergency Department (HOSPITAL_COMMUNITY): Payer: Commercial Managed Care - HMO

## 2023-02-21 ENCOUNTER — Other Ambulatory Visit: Payer: Self-pay

## 2023-02-21 ENCOUNTER — Encounter (HOSPITAL_COMMUNITY): Payer: Self-pay

## 2023-02-21 DIAGNOSIS — Z79899 Other long term (current) drug therapy: Secondary | ICD-10-CM | POA: Insufficient documentation

## 2023-02-21 DIAGNOSIS — J45909 Unspecified asthma, uncomplicated: Secondary | ICD-10-CM | POA: Diagnosis not present

## 2023-02-21 DIAGNOSIS — J441 Chronic obstructive pulmonary disease with (acute) exacerbation: Secondary | ICD-10-CM | POA: Insufficient documentation

## 2023-02-21 DIAGNOSIS — R079 Chest pain, unspecified: Secondary | ICD-10-CM | POA: Diagnosis present

## 2023-02-21 DIAGNOSIS — Z7951 Long term (current) use of inhaled steroids: Secondary | ICD-10-CM | POA: Diagnosis not present

## 2023-02-21 DIAGNOSIS — I10 Essential (primary) hypertension: Secondary | ICD-10-CM | POA: Insufficient documentation

## 2023-02-21 DIAGNOSIS — R0789 Other chest pain: Secondary | ICD-10-CM

## 2023-02-21 DIAGNOSIS — I2510 Atherosclerotic heart disease of native coronary artery without angina pectoris: Secondary | ICD-10-CM | POA: Diagnosis not present

## 2023-02-21 LAB — CBC
HCT: 39.3 % (ref 36.0–46.0)
Hemoglobin: 12.4 g/dL (ref 12.0–15.0)
MCH: 30.5 pg (ref 26.0–34.0)
MCHC: 31.6 g/dL (ref 30.0–36.0)
MCV: 96.8 fL (ref 80.0–100.0)
Platelets: 282 10*3/uL (ref 150–400)
RBC: 4.06 MIL/uL (ref 3.87–5.11)
RDW: 12.8 % (ref 11.5–15.5)
WBC: 4.5 10*3/uL (ref 4.0–10.5)
nRBC: 0 % (ref 0.0–0.2)

## 2023-02-21 LAB — BASIC METABOLIC PANEL
Anion gap: 9 (ref 5–15)
BUN: 14 mg/dL (ref 6–20)
CO2: 26 mmol/L (ref 22–32)
Calcium: 9.6 mg/dL (ref 8.9–10.3)
Chloride: 104 mmol/L (ref 98–111)
Creatinine, Ser: 0.9 mg/dL (ref 0.44–1.00)
GFR, Estimated: >60 mL/min (ref >60)
Glucose, Bld: 109 mg/dL — ABNORMAL HIGH (ref 70–99)
Potassium: 4.2 mmol/L (ref 3.5–5.1)
Sodium: 139 mmol/L (ref 135–145)

## 2023-02-21 LAB — TROPONIN I (HIGH SENSITIVITY)
Troponin I (High Sensitivity): 3 ng/L (ref <18)
Troponin I (High Sensitivity): 4 ng/L (ref <18)

## 2023-02-21 MED ORDER — METHOCARBAMOL 500 MG PO TABS
500.0000 mg | ORAL_TABLET | Freq: Two times a day (BID) | ORAL | 0 refills | Status: AC
Start: 2023-02-21 — End: ?

## 2023-02-21 MED ORDER — PREDNISONE 20 MG PO TABS
60.0000 mg | ORAL_TABLET | Freq: Once | ORAL | Status: AC
  Administered 2023-02-21: 60 mg via ORAL
  Filled 2023-02-21: qty 3

## 2023-02-21 MED ORDER — LIDOCAINE 5 % EX PTCH: 1.0000 | MEDICATED_PATCH | CUTANEOUS | 0 refills | Status: AC

## 2023-02-21 MED ORDER — PREDNISONE 20 MG PO TABS
40.0000 mg | ORAL_TABLET | Freq: Every day | ORAL | 0 refills | Status: AC
Start: 2023-02-21 — End: ?

## 2023-02-21 MED ORDER — ALBUTEROL SULFATE HFA 108 (90 BASE) MCG/ACT IN AERS
2.0000 | INHALATION_SPRAY | RESPIRATORY_TRACT | Status: DC | PRN
  Administered 2023-02-21: 2 via RESPIRATORY_TRACT
  Filled 2023-02-21: qty 6.7

## 2023-02-21 MED ORDER — LIDOCAINE 5 % EX PTCH
1.0000 | MEDICATED_PATCH | CUTANEOUS | Status: DC
  Administered 2023-02-21: 1 via TRANSDERMAL
  Filled 2023-02-21: qty 1

## 2023-02-21 NOTE — Discharge Instructions (Signed)
Please read and follow all provided instructions.  Your diagnoses today include:  1. Chest wall pain   2. COPD exacerbation (HCC)     Tests performed today include: An EKG of your heart A chest x-ray Cardiac enzymes - a blood test for heart muscle damage Blood counts and electrolytes Vital signs. See below for your results today.   Medications prescribed:  Prednisone - steroid medicine   It is best to take this medication in the morning to prevent sleeping problems. If you are diabetic, monitor your blood sugar closely and stop taking Prednisone if blood sugar is over 300. Take with food to prevent stomach upset.   Albuterol inhaler - medication that opens up your airway  Use inhaler as follows: 1-2 puffs with spacer every 4 hours as needed for wheezing, cough, or shortness of breath.   Take any prescribed medications only as directed.  Follow-up instructions: Please follow-up with your primary care provider as soon as you can for further evaluation of your symptoms.   Return instructions:  SEEK IMMEDIATE MEDICAL ATTENTION IF: You have severe chest pain, especially if the pain is crushing or pressure-like and spreads to the arms, back, neck, or jaw, or if you have sweating, nausea or vomiting, or trouble with breathing. THIS IS AN EMERGENCY. Do not wait to see if the pain will go away. Get medical help at once. Call 911. DO NOT drive yourself to the hospital.  Your chest pain gets worse and does not go away after a few minutes of rest.  You have an attack of chest pain lasting longer than what you usually experience.  You have significant dizziness, if you pass out, or have trouble walking.  You have chest pain not typical of your usual pain for which you originally saw your caregiver.  You have any other emergent concerns regarding your health.  Additional Information: Chest pain comes from many different causes. Your caregiver has diagnosed you as having chest pain that is not  specific for one problem, but does not require admission.  You are at low risk for an acute heart condition or other serious illness.   Your vital signs today were: BP (!) 129/96 (BP Location: Right Arm)   Pulse 64   Temp 98 F (36.7 C) (Oral)   Resp 20   Ht 5' (1.524 m)   Wt 71.2 kg   SpO2 97%   BMI 30.66 kg/m  If your blood pressure (BP) was elevated above 135/85 this visit, please have this repeated by your doctor within one month. --------------

## 2023-02-21 NOTE — ED Provider Notes (Signed)
[place] EMERGENCY DEPARTMENT AT [facility] Provider Note   CSN: [id number] Arrival date & time: 02/21/23  7829     History  Chief Complaint  Patient presents with   Chest Pain    Brittany Sparks is a 57 y.o. female.  Patient with history of COPD and asthma, CT performed 2 months ago showing coronary artery disease, but patient with no past cardiac history, history of hypertension and high cholesterol --presents to the emergency department today for evaluation of right-sided chest pain with radiation to the left side.  Symptoms started about a week ago.  She described a pain in her chest that was only made worse when she moved her arm.  Certain positions made worse.  However the past several days she has had tenderness to palpation.  She gets intense spasming type of pain in the chest when the right stimulus is applied.  She also reports generalized shortness of breath over the past month since she had to discontinue one of her inhalers Markus Daft).  Unfortunately her insurance does not cover her controller medications and she cannot afford $600 a month to pay for them. Patient denies risk factors for pulmonary embolism including: unilateral leg swelling, history of DVT/PE/other blood clots, use of exogenous hormones, recent immobilizations, recent surgery, recent travel (>4hr segment), malignancy, hemoptysis.        Home Medications Prior to Admission medications   Medication Sig Start Date End Date Taking? Authorizing Provider  albuterol (VENTOLIN HFA) 108 (90 Base) MCG/ACT inhaler Inhale 1-2 puffs into the lungs every 6 (six) hours as needed. 10/15/21   [provider]  amLODipine (NORVASC) 5 MG tablet Take 5 mg by mouth daily.    [provider]  Catheters (SILASTIC FOLEY CATHETER) MISC Catheters for home self cath up to 4 times daily. 09/21/22   Garlon Hatchet, PA-C  famotidine (PEPCID) 20 MG tablet One at bedtime Patient taking differently: Take 20 mg by  mouth at bedtime. One at bedtime 02/24/18   [name], MD  Fluticasone-Umeclidin-Vilant (TRELEGY ELLIPTA) 100-62.5-25 MCG/ACT AEPB Inhale 1 puff into the lungs daily. 02/18/23   Hunsucker, Lesia Sago, MD  gabapentin (NEURONTIN) 100 MG capsule TAKE 1 CAPSULE(100 MG) BY MOUTH TWICE DAILY 02/12/21   [name], MD  HYDROcodone bit-homatropine (HYDROMET) 5-1.5 MG/5ML syrup Take 5 mLs by mouth every 6 (six) hours as needed for cough. 11/21/22   Parrett, Virgel Bouquet, NP  ID NOW COVID-19 KIT See admin instructions. Patient not taking: Reported on 11/21/2022 12/01/19   [provider]  olmesartan (BENICAR) 40 MG tablet Take 40 mg by mouth daily.    [provider]  pantoprazole (PROTONIX) 40 MG tablet TAKE 1 TABLET(40 MG) BY MOUTH DAILY 30 TO 60 MINUTES BEFORE FIRST MEAL OF THE DAY 10/06/18   [name], MD  predniSONE (DELTASONE) 10 MG tablet 4 tabs for 2 days, then 3 tabs for 2 days, 2 tabs for 2 days, then 1 tab for 2 days, then stop 11/21/22   Parrett, [name] S, NP  simvastatin (ZOCOR) 10 MG tablet Take 10 mg by mouth at bedtime.    [provider]  valACYclovir (VALTREX) 1000 MG tablet Take 1,000 mg by mouth 2 (two) times daily.    [provider]      Allergies    Patient has no known allergies.    Review of Systems   Review of Systems  Physical Exam Updated Vital Signs BP (!) 131/99 (BP Location: Left  Arm)   Pulse 69   Temp 98.5 F (36.9 C) (Oral)   Resp 18   Ht 5' (1.524 m)   Wt 71.2 kg   SpO2 95%   BMI 30.66 kg/m  Physical Exam Vitals and nursing note reviewed.  Constitutional:      Appearance: She is well-developed. She is not diaphoretic.  HENT:     Head: Normocephalic and atraumatic.     Mouth/Throat:     Mouth: Mucous membranes are not dry.  Eyes:     Conjunctiva/sclera: Conjunctivae normal.  Neck:     Vascular: Normal carotid pulses. No JVD.     Trachea: Trachea normal. No tracheal deviation.  Cardiovascular:     Rate and Rhythm:  Normal rate and regular rhythm.     Pulses: No decreased pulses.          Radial pulses are 2+ on the right side and 2+ on the left side.     Heart sounds: Normal heart sounds, S1 normal and S2 normal. No murmur heard. Pulmonary:     Effort: Pulmonary effort is normal. No respiratory distress.     Breath sounds: No wheezing.  Chest:     Chest wall: Tenderness present.     Comments: Patient is tender to palpation over the left chest wall.  No rash.  Even with the weight of the stethoscope on the chest wall causes her to slightly wince.  She tries not to move her right arm and keep it still to avoid exacerbating the spasms. Abdominal:     General: Bowel sounds are normal.     Palpations: Abdomen is soft.     Tenderness: There is no abdominal tenderness. There is no guarding or rebound.  Musculoskeletal:        General: Normal range of motion.     Cervical back: Normal range of motion and neck supple. No muscular tenderness.  Skin:    General: Skin is warm and dry.     Coloration: Skin is not pale.  Neurological:     Mental Status: She is alert.     ED Results / Procedures / Treatments   Labs (all labs ordered are listed, but only abnormal results are displayed) Labs Reviewed  BASIC METABOLIC PANEL - Abnormal; Notable for the following components:      Result Value   Glucose, Bld 109 (*)    All other components within normal limits  CBC  TROPONIN I (HIGH SENSITIVITY)  TROPONIN I (HIGH SENSITIVITY)    EKG EKG Interpretation Date/Time:  Saturday February 21 2023 06:57:18 EDT Ventricular Rate:  85 PR Interval:  141 QRS Duration:  89 QT Interval:  370 QTC Calculation: 440 R Axis:   31  Text Interpretation: Sinus rhythm Borderline T abnormalities, anterior leads Confirmed by Alona Bene [phone]) on 02/21/2023 7:02:11 AM  Radiology DG Chest 2 View  Result Date: 02/21/2023 CLINICAL DATA:  Chest pain EXAM: CHEST - 2 VIEW COMPARISON:  11/21/2022 FINDINGS: Normal heart size and  mediastinal contours. No acute infiltrate or edema. No effusion or pneumothorax. No acute osseous findings. IMPRESSION: No active cardiopulmonary disease. Electronically Signed   By: Tiburcio Pea M.D.   On: 02/21/2023 07:48    Procedures Procedures    Medications Ordered in ED Medications  lidocaine (LIDODERM) 5 % 1 patch (1 patch Transdermal Patch Applied 02/21/23 0908)  albuterol (VENTOLIN HFA) 108 (90 Base) MCG/ACT inhaler 2 puff (2 puffs Inhalation Given 02/21/23 0910)  predniSONE (DELTASONE) tablet 60 mg (60  mg Oral Given 02/21/23 0911)    ED Course/ Medical Decision Making/ A&P    Patient seen and examined. History obtained directly from patient. Work-up including labs, imaging, EKG ordered in triage, if performed, were reviewed.    Labs/EKG: Independently reviewed and interpreted.  This included: CBC unremarkable; BMP with glucose 109 otherwise unremarkable; first troponin negative at 3.  Imaging: Independently visualized and interpreted.  This included: Chest x-ray, agree negative  Medications/Fluids: Ordered: Lidoderm, albuterol, prednisone.  Most recent vital signs reviewed and are as follows: BP (!) 131/99 (BP Location: Left Arm)   Pulse 69   Temp 98.5 F (36.9 C) (Oral)   Resp 18   Ht 5' (1.524 m)   Wt 71.2 kg   SpO2 95%   BMI 30.66 kg/m   Initial impression: Right-sided chest wall pain, poor control and mild exacerbation of COPD.  10:09 AM Reassessment performed. Patient appears stable.  Continues to have some chest wall tenderness.  Labs personally reviewed and interpreted including: Second troponin normal at 4.  Reviewed pertinent lab work and imaging with patient at bedside. Questions answered.   Most current vital signs reviewed and are as follows: BP (!) 129/96 (BP Location: Right Arm)   Pulse 64   Temp 98 F (36.7 C) (Oral)   Resp 20   Ht 5' (1.524 m)   Wt 71.2 kg   SpO2 97%   BMI 30.66 kg/m   Plan: Discharge to home.   Prescriptions  written for: Robaxin per request, Lidoderm, prednisone x 4 days.  Encouraged use of home albuterol inhaler 2 puffs every 4 hours as needed for shortness of breath or wheezing  Return and follow-up instructions: I encouraged patient to return to ED with severe chest pain, especially if the pain is crushing or pressure-like and spreads to the arms, back, neck, or jaw, or if they have associated sweating, vomiting, or shortness of breath with the pain, or significant pain with activity. We discussed that the evaluation here today indicates a low-risk of serious cause of chest pain, including heart trouble or a blood clot, but no evaluation is perfect and chest pain can evolve with time. The patient verbalized understanding and agreed.  I encouraged patient to follow-up with their provider in the next 48 hours for recheck.                                     Medical Decision Making Risk Prescription drug management.   For this patient's complaint of chest pain, the following emergent conditions were considered on the differential diagnosis: acute coronary syndrome, pulmonary embolism, pneumothorax, myocarditis, pericardial tamponade, aortic dissection, thoracic aortic aneurysm complication, esophageal perforation.   Other causes were also considered including: gastroesophageal reflux disease, musculoskeletal pain including costochondritis, pneumonia/pleurisy, herpes zoster, pericarditis.  Symptoms are very atypical for ACS or PE, worse with any palpation or movement of arm. Seems MSK in nature.   In regards to possibility of ACS, patient has atypical features of pain, non-ischemic and unchanged EKG and negative troponin(s).   In regards to possibility of PE, symptoms are atypical for PE, vital signs reassuring, no clinical signs and symptoms of DVT.  Increasing shortness of breath and poor control of COPD and asthma, worse since having to come off of her controller medication.  Will treat with  albuterol and prednisone today for short-term control.         Final Clinical  Impression(s) / ED Diagnoses Final diagnoses:  Chest wall pain  COPD exacerbation [facility])    Rx / DC Orders ED Discharge Orders     None         Renne Crigler, PA-C 02/21/23 1012    Long, Arlyss Repress, MD 02/23/23 [phone]

## 2023-02-21 NOTE — ED Triage Notes (Signed)
Pt. Arrives POV for chest pain x2 days. Pt. States that the pain started on the right side of her chest, but last night, it radiated to the left side of her chest. She endorses SOB and states that the pain is worse when she takes a deep breath. Pt. Has hx of asthma and COPD and states she has been without an inhaler for 4weeks.

## 2023-04-13 DIAGNOSIS — R339 Retention of urine, unspecified: Secondary | ICD-10-CM | POA: Insufficient documentation

## 2023-04-29 ENCOUNTER — Ambulatory Visit: Payer: Medicaid Other | Admitting: Pulmonary Disease

## 2023-05-05 ENCOUNTER — Encounter: Payer: Self-pay | Admitting: Pulmonary Disease

## 2023-05-24 ENCOUNTER — Emergency Department (HOSPITAL_COMMUNITY): Payer: BLUE CROSS/BLUE SHIELD

## 2023-05-24 ENCOUNTER — Emergency Department (HOSPITAL_COMMUNITY)
Admission: EM | Admit: 2023-05-24 | Discharge: 2023-05-24 | Disposition: A | Discharge status: Home / Self Care | Payer: BLUE CROSS/BLUE SHIELD | Attending: Emergency Medicine | Admitting: Emergency Medicine

## 2023-05-24 ENCOUNTER — Encounter (HOSPITAL_COMMUNITY): Payer: Self-pay

## 2023-05-24 DIAGNOSIS — J45909 Unspecified asthma, uncomplicated: Secondary | ICD-10-CM | POA: Insufficient documentation

## 2023-05-24 DIAGNOSIS — R09A2 Foreign body sensation, throat: Secondary | ICD-10-CM | POA: Diagnosis not present

## 2023-05-24 DIAGNOSIS — Z79899 Other long term (current) drug therapy: Secondary | ICD-10-CM | POA: Insufficient documentation

## 2023-05-24 DIAGNOSIS — R1310 Dysphagia, unspecified: Secondary | ICD-10-CM | POA: Diagnosis present

## 2023-05-24 DIAGNOSIS — J449 Chronic obstructive pulmonary disease, unspecified: Secondary | ICD-10-CM | POA: Insufficient documentation

## 2023-05-24 DIAGNOSIS — Z7951 Long term (current) use of inhaled steroids: Secondary | ICD-10-CM | POA: Diagnosis not present

## 2023-05-24 DIAGNOSIS — I10 Essential (primary) hypertension: Secondary | ICD-10-CM | POA: Insufficient documentation

## 2023-05-24 LAB — COMPREHENSIVE METABOLIC PANEL
ALT: 16 U/L (ref 0–44)
AST: 26 U/L (ref 15–41)
Albumin: 4.4 g/dL (ref 3.5–5.0)
Alkaline Phosphatase: 45 U/L (ref 38–126)
Anion gap: 9 (ref 5–15)
BUN: 9 mg/dL (ref 6–20)
CO2: 23 mmol/L (ref 22–32)
Calcium: 9.4 mg/dL (ref 8.9–10.3)
Chloride: 106 mmol/L (ref 98–111)
Creatinine, Ser: 0.9 mg/dL (ref 0.44–1.00)
GFR, Estimated: >60 mL/min (ref >60)
Glucose, Bld: 91 mg/dL (ref 70–99)
Potassium: 5.1 mmol/L (ref 3.5–5.1)
Sodium: 138 mmol/L (ref 135–145)
Total Bilirubin: 1 mg/dL (ref <1.2)
Total Protein: 8 g/dL (ref 6.5–8.1)

## 2023-05-24 LAB — CBC WITH DIFFERENTIAL/PLATELET
Abs Immature Granulocytes: 0.01 10*3/uL (ref 0.00–0.07)
Basophils Absolute: 0 10*3/uL (ref 0.0–0.1)
Basophils Relative: 1 %
Eosinophils Absolute: 0 10*3/uL (ref 0.0–0.5)
Eosinophils Relative: 1 %
HCT: 36.6 % (ref 36.0–46.0)
Hemoglobin: 11.6 g/dL — ABNORMAL LOW (ref 12.0–15.0)
Immature Granulocytes: 0 %
Lymphocytes Relative: 37 %
Lymphs Abs: 1.9 10*3/uL (ref 0.7–4.0)
MCH: 31 pg (ref 26.0–34.0)
MCHC: 31.7 g/dL (ref 30.0–36.0)
MCV: 97.9 fL (ref 80.0–100.0)
Monocytes Absolute: 0.6 10*3/uL (ref 0.1–1.0)
Monocytes Relative: 11 %
Neutro Abs: 2.7 10*3/uL (ref 1.7–7.7)
Neutrophils Relative %: 50 %
Platelets: 297 10*3/uL (ref 150–400)
RBC: 3.74 MIL/uL — ABNORMAL LOW (ref 3.87–5.11)
RDW: 13.1 % (ref 11.5–15.5)
WBC: 5.3 10*3/uL (ref 4.0–10.5)
nRBC: 0 % (ref 0.0–0.2)

## 2023-05-24 LAB — LIPASE, BLOOD: Lipase: 32 U/L (ref 11–51)

## 2023-05-24 LAB — TROPONIN I (HIGH SENSITIVITY): Troponin I (High Sensitivity): 3 ng/L (ref <18)

## 2023-05-24 MED ORDER — ALUM & MAG HYDROXIDE-SIMETH 200-200-20 MG/5ML PO SUSP
30.0000 mL | Freq: Once | ORAL | Status: AC
  Administered 2023-05-24: 30 mL via ORAL
  Filled 2023-05-24: qty 30

## 2023-05-24 MED ORDER — PANTOPRAZOLE SODIUM 40 MG IV SOLR
40.0000 mg | Freq: Once | INTRAVENOUS | Status: AC
  Administered 2023-05-24: 40 mg via INTRAVENOUS
  Filled 2023-05-24: qty 10

## 2023-05-24 MED ORDER — LIDOCAINE VISCOUS HCL 2 % MT SOLN
15.0000 mL | Freq: Once | OROMUCOSAL | Status: AC
  Administered 2023-05-24: 15 mL via ORAL
  Filled 2023-05-24: qty 15

## 2023-05-24 NOTE — ED Provider Notes (Signed)
[place] EMERGENCY DEPARTMENT AT [facility] Provider Note   CSN: [id number] Arrival date & time: 05/24/23  1213     History  Chief Complaint  Patient presents with   Food Stuck     Brittany Sparks is a 57 y.o. female with PMH as listed below who presents with esophageal food bolus.  Pt c/o having food stuck in esophagus x2 days.  Feels the sensation in her substernal chest.  Pt reports trying to eat and drink other foods to dislodge item w/ no relief.  Pt has started having acid reflux and nausea.  Pt is followed by GI at [facility].  Sts she had an endoscopy x3 days ago, she states she was supposed to have an esophageal dilation at that time but it did not occur.  She states she does typically have problems with her esophagus and swallowing and gets food intermittently stuck sometimes but this is never been this long.  She has never had to have an emergent endoscopy for food bolus removal.  She is able to swallow her secretions and has not had any vomiting.  Was otherwise in her normal state of health.  Past Medical History:  Diagnosis Date   Asthma    Constipation    COPD (chronic obstructive pulmonary disease) (HCC)    Drug addiction (HCC)    GERD (gastroesophageal reflux disease)    Hepatitis C    Herpes simplex type 2 infection    Hyperlipidemia    Hypertension    Recurrent mouth ulceration    SBO (small bowel obstruction) (HCC)    2014       Home Medications Prior to Admission medications   Medication Sig Start Date End Date Taking? Authorizing Provider  albuterol (VENTOLIN HFA) 108 (90 Base) MCG/ACT inhaler Inhale 1-2 puffs into the lungs every 6 (six) hours as needed. 10/15/21   [provider]  amLODipine (NORVASC) 5 MG tablet Take 5 mg by mouth daily.    [provider]  Catheters (SILASTIC FOLEY CATHETER) MISC Catheters for home self cath up to 4 times daily. 09/21/22   Garlon Hatchet, PA-C  famotidine (PEPCID) 20 MG tablet One at  bedtime Patient taking differently: Take 20 mg by mouth at bedtime. One at bedtime 02/24/18   [name], MD  Fluticasone-Umeclidin-Vilant (TRELEGY ELLIPTA) 100-62.5-25 MCG/ACT AEPB Inhale 1 puff into the lungs daily. 02/18/23   Hunsucker, Lesia Sago, MD  gabapentin (NEURONTIN) 100 MG capsule TAKE 1 CAPSULE(100 MG) BY MOUTH TWICE DAILY 02/12/21   [name], MD  HYDROcodone bit-homatropine (HYDROMET) 5-1.5 MG/5ML syrup Take 5 mLs by mouth every 6 (six) hours as needed for cough. 11/21/22   Parrett, Virgel Bouquet, NP  ID NOW COVID-19 KIT See admin instructions. Patient not taking: Reported on 11/21/2022 12/01/19   [provider]  lidocaine (LIDODERM) 5 % Place 1 patch onto the skin daily. Remove & Discard patch within 12 hours or as directed by MD 02/21/23   Renne Crigler, PA-C  methocarbamol (ROBAXIN) 500 MG tablet Take 1 tablet (500 mg total) by mouth 2 (two) times daily. 02/21/23   Renne Crigler, PA-C  olmesartan (BENICAR) 40 MG tablet Take 40 mg by mouth daily.    [provider]  pantoprazole (PROTONIX) 40 MG tablet TAKE 1 TABLET(40 MG) BY MOUTH DAILY 30 TO 60 MINUTES BEFORE FIRST MEAL OF THE DAY 10/06/18   [name], MD  predniSONE (DELTASONE) 20 MG tablet Take 2 tablets (40 mg total) by  mouth daily. 02/21/23   Renne Crigler, PA-C  simvastatin (ZOCOR) 10 MG tablet Take 10 mg by mouth at bedtime.    [provider]  valACYclovir (VALTREX) 1000 MG tablet Take 1,000 mg by mouth 2 (two) times daily.    [provider]      Allergies    Patient has no known allergies.    Review of Systems   Review of Systems A 10 point review of systems was performed and is negative unless otherwise reported in HPI.  Physical Exam Updated Vital Signs BP (!) 150/99 (BP Location: Left Arm)   Pulse 71   Temp 99 F (37.2 C)   Resp 18   Ht 5' (1.524 m)   Wt 66.7 kg   SpO2 99%   BMI 28.71 kg/m  Physical Exam General: Normal appearing female, lying in bed.  HEENT:  Sclera anicteric, MMM, trachea midline. Clear oropharynx. Cardiology: RRR, no murmurs/rubs/gallops. Resp: Normal respiratory rate and effort. CTAB, no wheezes, rhonchi, crackles.  Abd: Soft, non-tender, non-distended. No rebound tenderness or guarding.  GU: Deferred. MSK: No peripheral edema or signs of trauma. Extremities without deformity or TTP. No cyanosis or clubbing. Skin: warm, dry.  Neuro: A&Ox4, CNs II-XII grossly intact. MAEs. Sensation grossly intact.  Psych: Normal mood and affect.   ED Results / Procedures / Treatments   Labs (all labs ordered are listed, but only abnormal results are displayed) Labs Reviewed  CBC WITH DIFFERENTIAL/PLATELET - Abnormal; Notable for the following components:      Result Value   RBC 3.74 (*)    Hemoglobin 11.6 (*)    All other components within normal limits  COMPREHENSIVE METABOLIC PANEL  LIPASE, BLOOD  TROPONIN I (HIGH SENSITIVITY)    EKG EKG Interpretation Date/Time:  Sunday May 24 2023 14:09:13 EST Ventricular Rate:  54 PR Interval:  140 QRS Duration:  94 QT Interval:  392 QTC Calculation: 372 R Axis:   26  Text Interpretation: Sinus rhythm Borderline T abnormalities, anterior leads Confirmed by Vivi Barrack [phone]) on 05/24/2023 2:13:15 PM  Radiology DG Chest Portable 1 View  Result Date: 05/24/2023 CLINICAL DATA:  Chest pain EXAM: PORTABLE CHEST 1 VIEW COMPARISON:  02/21/2023 FINDINGS: The heart size and mediastinal contours are within normal limits. Both lungs are clear. The visualized skeletal structures are unremarkable. IMPRESSION: No active disease. Electronically Signed   By: Duanne Guess D.O.   On: 05/24/2023 14:58    Procedures Procedures    Medications Ordered in ED Medications  pantoprazole (PROTONIX) injection 40 mg (40 mg Intravenous Given 05/24/23 1458)  alum & mag hydroxide-simeth (MAALOX/MYLANTA) 200-200-20 MG/5ML suspension 30 mL (30 mLs Oral Given 05/24/23 1457)    And  lidocaine  (XYLOCAINE) 2 % viscous mouth solution 15 mL (15 mLs Oral Given 05/24/23 1457)    ED Course/ Medical Decision Making/ A&P                          Medical Decision Making Amount and/or Complexity of Data Reviewed Labs: ordered. Decision-making details documented in ED Course. Radiology: ordered. Decision-making details documented in ED Course.  Risk OTC drugs. Prescription drug management.    This patient presents to the ED for concern of esophageal impaction, this involves an extensive number of treatment options, and is a complaint that carries with it a high risk of complications and morbidity.  I considered the following differential and admission for this acute, potentially life threatening condition.   MDM:  EKG does not demonstrate any signs of arrhythmia or ischemia.  Patient does not have any indication clinically of complete esophageal obstruction as she is able to swallow her secretions and overall appears comfortable, however she does have indication of partial esophageal obstruction or food bolus for nearly 48 hours.  Tried soda initially here in the ED which did not help her symptoms. Tried protonix and GI cocktail. Patient states GI cocktail helped for a moment but discomfort returned. Also wanted to clear patient from cardiac perspective. EKG w/o signs of arrhythmia or ischemia, and troponin x2. CXR also clear. Low c/f ACS or dissection causing pain. Most likely globus sensation d/t esophagitis. Will c/w GI.   Clinical Course as of 05/24/23 2208  Wynelle Link May 24, 2023  1435 D/w Dr. Dulce Sellar with GI who states that if patient is able to tolerate secretions, no need for emergent intervention. Often when there is a foreign body sensation, if able to tolerate secretions, there is often nothing there. Can f/u with her outpatient GI. I will complete cardiac workup and given symptomatic control. [HN]  1536 Troponin I (High Sensitivity): 3 CMP, CBC, lipase, and single trop unremarkable.  [HN]  1537 DG Chest Portable 1 View CXR wnl [HN]    Clinical Course User Index [HN] Loetta Rough, MD    Labs: I Ordered, and personally interpreted labs.  The pertinent results include:  those listed above  Imaging Studies ordered: I ordered imaging studies including CXR I independently visualized and interpreted imaging. I agree with the radiologist interpretation  Additional history obtained from chart review.    Cardiac Monitoring: The patient was maintained on a cardiac monitor.  I personally viewed and interpreted the cardiac monitored which showed an underlying rhythm of: NSR  Reevaluation: After the interventions noted above, I reevaluated the patient and found that they have :stayed the same  Social Determinants of Health: Lives independently  Disposition:  Patient informed of recommendations by GI. DC w/ discharge instructions/return precautions. All questions answered to patient's satisfaction.  F/u with GI.   Co morbidities that complicate the patient evaluation  Past Medical History:  Diagnosis Date   Asthma    Constipation    COPD (chronic obstructive pulmonary disease) (HCC)    Drug addiction (HCC)    GERD (gastroesophageal reflux disease)    Hepatitis C    Herpes simplex type 2 infection    Hyperlipidemia    Hypertension    Recurrent mouth ulceration    SBO (small bowel obstruction) (HCC)    2014     Medicines Meds ordered this encounter  Medications   pantoprazole (PROTONIX) injection 40 mg   AND Linked Order Group    alum & mag hydroxide-simeth (MAALOX/MYLANTA) 200-200-20 MG/5ML suspension 30 mL    lidocaine (XYLOCAINE) 2 % viscous mouth solution 15 mL    I have reviewed the patients home medicines and have made adjustments as needed  Problem List / ED Course: Problem List Items Addressed This Visit   None Visit Diagnoses     Dysphagia, unspecified type    -  Primary   Globus sensation                       This note  was created using dictation software, which may contain spelling or grammatical errors.    Loetta Rough, MD 05/24/23 [phone]

## 2023-05-24 NOTE — Discharge Instructions (Signed)
Thank you for coming to [facility]. You were seen for feeling of food stuck in your lower esophagus. We did an exam, labs, and imaging, and these showed no acute findings. Please follow up with your gastroenterologist within 1 week for your difficulty swallowing.   Do not hesitate to return to the ED or call 911 if you experience: -Worsening symptoms -Inability to eat/drink or swallow your saliva -Nausea/vomiting so severe you cannot eat/drink anything -Abdominal pain -Lightheadedness, passing out -Fevers/chills -Anything else that concerns you

## 2023-05-24 NOTE — ED Triage Notes (Addendum)
Pt c/o having food stuck in esophagus x2 days.  Pt reports trying to eat and drink other foods to dislodge item w/ no relief.  Pt has started having acid reflux and nausea.     Pt is followed by GI at [facility].  Sts she had an endoscopy x3 days ago.   Pt able to speak full sentences and maintain saliva.

## 2023-05-27 ENCOUNTER — Other Ambulatory Visit (HOSPITAL_COMMUNITY): Payer: Self-pay | Admitting: Gastroenterology

## 2023-05-27 DIAGNOSIS — Z9889 Other specified postprocedural states: Secondary | ICD-10-CM

## 2023-06-03 ENCOUNTER — Telehealth: Payer: Self-pay | Admitting: *Deleted

## 2023-06-03 ENCOUNTER — Other Ambulatory Visit (HOSPITAL_COMMUNITY): Payer: Self-pay

## 2023-06-03 NOTE — Telephone Encounter (Signed)
Received letter from [facility] services stating that the request for PA for Markus Daft was denied because 2 of the preferred medications had not been tried and failed.  According to her medical record, she has tried and failed the following inhalers:  Symbicort, Breo, Advair, Dulera, Spiriva, Stiolto and Trelegy.  Please provide this information in the appeal to approve the [facility].  Thank you.

## 2023-06-03 NOTE — Telephone Encounter (Signed)
Patient has [employer] as primary and they cover Breztri with a $15.00 co-pay

## 2023-06-05 ENCOUNTER — Inpatient Hospital Stay (HOSPITAL_COMMUNITY): Admission: RE | Admit: 2023-06-05 | Payer: BLUE CROSS/BLUE SHIELD | Source: Ambulatory Visit

## 2023-06-05 ENCOUNTER — Encounter (HOSPITAL_COMMUNITY): Payer: Self-pay

## 2023-07-21 NOTE — Telephone Encounter (Signed)
 Dr. Judeth Horn, please see message from PA team and advise regarding Breztri.  Thank you.

## 2023-07-21 NOTE — Telephone Encounter (Signed)
$  15 copay is about as good as I have seen. I would advise continuing Breztri if $15 copay is feasible. If not have her let us know and we can try to look for a solution.

## 2023-07-22 NOTE — Telephone Encounter (Signed)
LMCTB

## 2023-07-23 NOTE — Telephone Encounter (Signed)
ATC pt X2. Lvm asking for a call back.

## 2023-07-29 NOTE — Telephone Encounter (Signed)
Patient used [employer]rx and was able to get the medication. She is ok. NFN.

## 2023-10-07 ENCOUNTER — Other Ambulatory Visit (HOSPITAL_COMMUNITY): Payer: Self-pay

## 2023-10-07 ENCOUNTER — Telehealth: Payer: Self-pay

## 2023-10-07 NOTE — Telephone Encounter (Signed)
 Pharmacy Patient Advocate Encounter  Insurance verification completed.   The patient is insured through  [employer] test claim for [employer]. Currently a quantity of 84 is a 28 day supply and medication will need a  PA .   This test claim was processed through [facility]- copay amounts may vary at other pharmacies due to pharmacy/plan contracts, or as the patient moves through the different stages of their insurance plan.

## 2023-10-16 ENCOUNTER — Encounter: Payer: Self-pay | Admitting: Infectious Diseases

## 2023-10-16 ENCOUNTER — Ambulatory Visit (INDEPENDENT_AMBULATORY_CARE_PROVIDER_SITE_OTHER): Admitting: Infectious Diseases

## 2023-10-16 ENCOUNTER — Other Ambulatory Visit: Payer: Self-pay

## 2023-10-16 VITALS — BP 119/81 | HR 92 | Temp 97.8°F | Ht 60.0 in | Wt 148.0 lb

## 2023-10-16 DIAGNOSIS — Z8619 Personal history of other infectious and parasitic diseases: Secondary | ICD-10-CM | POA: Diagnosis present

## 2023-10-16 DIAGNOSIS — R61 Generalized hyperhidrosis: Secondary | ICD-10-CM

## 2023-10-16 DIAGNOSIS — Z227 Latent tuberculosis: Secondary | ICD-10-CM

## 2023-10-16 NOTE — Progress Notes (Signed)
 Patient: Brittany Sparks  DOB: 1966-01-11 MRN: [id number] PCP: Salli Real, MD  Referring Provider:Tiffany Bartholome Bill  Reason for Visit: Reactive Hep C Ab   Chief Complaint  Patient presents with   New Patient (Initial Visit)      Subjective   Subjective:   Discussed the use of AI scribe software for clinical note transcription with the patient, who gave verbal consent to proceed.  History of Present Illness   Brittany Sparks is a 58 year old female who presents for evaluation of reactive hepatitis C antibody test.   She has a history of hepatitis C exposure and previously underwent treatment with Harvoni, taking a pill once a day for approximately three months several years ago. She did not have any evidence of cirrhosis on pretreatment assessment per her report. She is very worried that it is back and not quite sure what the labs were that were ordered.  She has been experiencing night sweats for the past couple of months, which began suddenly. She wakes up drenched in sweat every night. She does not have menstrual periods, having undergone a complete hysterectomy in 1999 due to endometriosis. She is uncertain if her ovaries were removed during the procedure. She recalls experiencing mood swings and night sweats post-hysterectomy that eventually resolved.  Her past medical history includes a positive tuberculosis screening test in 1994, for which she completed a six-month course of treatment. She goes for periodic chest xrays to ensure no changes. She has also been screened for breast cancer and has undergone a colonoscopy. No weight loss, lumps, or bumps in her head, neck, or armpits. She does have a cough that she attributes to seasonal allergies.      Seen in PCP office   PLT 236 WBC Alk Phos 62 AST 19 ALT 16 HIV Ab NR HCV Ab Reactive HCV RNA (-)    Review of Systems  Constitutional:  Positive for diaphoresis. Negative for chills, fever, malaise/fatigue and weight loss.   Respiratory:  Positive for cough. Negative for hemoptysis, sputum production and shortness of breath.   Gastrointestinal:  Negative for abdominal pain and nausea.    Past Medical History:  Diagnosis Date   Asthma    Constipation    COPD (chronic obstructive pulmonary disease) (HCC)    Drug addiction (HCC)    GERD (gastroesophageal reflux disease)    Hepatitis C    Herpes simplex type 2 infection    Hyperlipidemia    Hypertension    Recurrent mouth ulceration    SBO (small bowel obstruction) (HCC)    2014    Outpatient Medications Prior to Visit  Medication Sig Dispense Refill   albuterol (VENTOLIN HFA) 108 (90 Base) MCG/ACT inhaler Inhale 1-2 puffs into the lungs every 6 (six) hours as needed.     amLODipine (NORVASC) 5 MG tablet Take 5 mg by mouth daily.     ascorbic acid (VITAMIN C) 100 MG tablet Take by mouth.     BREZTRI AEROSPHERE 160-9-4.8 MCG/ACT AERO inhaler INAHLE 2 PUFFS INTO THE LUNGS IN THE MORNING AND AT BEDTIME     Catheters (SILASTIC FOLEY CATHETER) MISC Catheters for home self cath up to 4 times daily. 20 each 0   famotidine (PEPCID) 20 MG tablet One at bedtime (Patient taking differently: Take 20 mg by mouth at bedtime. One at bedtime) 30 tablet 11   ferrous sulfate 325 (65 FE) MG EC tablet Take 325 mg by mouth daily with breakfast.  gabapentin (NEURONTIN) 100 MG capsule TAKE 1 CAPSULE(100 MG) BY MOUTH TWICE DAILY 180 capsule 1   HYDROcodone bit-homatropine (HYDROMET) 5-1.5 MG/5ML syrup Take 5 mLs by mouth every 6 (six) hours as needed for cough. 120 mL 0   lidocaine (LIDODERM) 5 % Place 1 patch onto the skin daily. Remove & Discard patch within 12 hours or as directed by MD 14 patch 0   metFORMIN (GLUCOPHAGE-XR) 500 MG 24 hr tablet TAKE ONE TABLET BY MOUTH IN THE EVENING WITH A MEAL     methocarbamol (ROBAXIN) 500 MG tablet Take 1 tablet (500 mg total) by mouth 2 (two) times daily. 20 tablet 0   olmesartan (BENICAR) 40 MG tablet Take 40 mg by mouth daily.      pantoprazole (PROTONIX) 40 MG tablet TAKE 1 TABLET(40 MG) BY MOUTH DAILY 30 TO 60 MINUTES BEFORE FIRST MEAL OF THE DAY 30 tablet 2   simvastatin (ZOCOR) 10 MG tablet Take 10 mg by mouth at bedtime.     valACYclovir (VALTREX) 1000 MG tablet Take 1,000 mg by mouth 2 (two) times daily.     Vitamin D, Ergocalciferol, (DRISDOL) 1.25 MG (50000 UNIT) CAPS capsule Take 1 capsule by mouth once a week.     Fluticasone-Umeclidin-Vilant (TRELEGY ELLIPTA) 100-62.5-25 MCG/ACT AEPB Inhale 1 puff into the lungs daily. (Patient not taking: Reported on 10/16/2023) 28 each 3   ID NOW COVID-19 KIT See admin instructions. (Patient not taking: Reported on 11/21/2022)     predniSONE (DELTASONE) 20 MG tablet Take 2 tablets (40 mg total) by mouth daily. (Patient not taking: Reported on 10/16/2023) 8 tablet 0   No facility-administered medications prior to visit.     No Known Allergies  Social History   Tobacco Use   Smoking status: Former    Current packs/day: 0.00    Average packs/day: 0.5 packs/day for 30.0 years (15.0 ttl pk-yrs)    Types: Cigarettes    Start date: 11/04/1976    Quit date: 11/05/2006    Years since quitting: 16.9   Smokeless tobacco: Never  Substance Use Topics   Alcohol use: No   Drug use: No    Family History  Problem Relation Age of Onset   Emphysema Father    Heart disease Father    Clotting disorder Father        Objective   Objective:   Vitals:   10/16/23 1028  BP: 119/81  Pulse: 92  Temp: 97.8 F (36.6 C)  TempSrc: Temporal  SpO2: 93%  Weight: 148 lb (67.1 kg)  Height: 5' (1.524 m)   Body mass index is 28.9 kg/m.  Physical Exam Constitutional:      Appearance: Normal appearance. She is not ill-appearing.  HENT:     Mouth/Throat:     Mouth: Mucous membranes are moist.     Pharynx: Oropharynx is clear.  Eyes:     General: No scleral icterus. Cardiovascular:     Rate and Rhythm: Normal rate.  Pulmonary:     Effort: Pulmonary effort is normal.   Neurological:     Mental Status: She is oriented to person, place, and time.  Psychiatric:        Mood and Affect: Mood normal.        Thought Content: Thought content normal.       Assessment & Plan:  Assessment and Plan    Reactive Hepatitis C Antibody -  Eradicated S/P Harvoni Tx -  Brittany Sparks has no active hepatitis C viral load and a  reactive antibody. We discussed that the treatment she took years ago with Harvoni was still effective. Nothing reactivated. Discussed that hepatitis C does not reactivate and it would only be re-infected from new exposure, for which she has no risk for. We discussed this today to ensure she understood her antibody will ALWAYS be positive. HIV screen was negative. No cirrhosis on pre-treatment assessment per her report and normal LFTs with normal platelet count and wbc count all indicating intact synthetic liver function. No liver cancer screenings are necessary going forward.  General liver health recommendations for care - getting diabetes under control to avoid metabolic "fatty" liver disease will be in her best interest going forward.  - No treatment needed  - Recommend hep b screening and vaccination if needed through PCP office if not already done  - Prevnar 20 vaccine with history of DM at PCP follow up  - No need to return to ID for further recommendations    Night sweats - All I can see is hysterectomy documented - unclear if ovaries remained at the time of surgery in 1999.  TSH normal. A1C is elevated so hypoglycemia is possible I suspect.   Tuberculosis reactivation unlikely due to previous treatment, no other symptoms and normal CXRs. At this time I most suspect hormonal cause to the night sweats.  - Please consider either fezolinetant or HRT trial to see if this improves.  - Consult primary care physician regarding hormone therapy for night sweats.  Tuberculosis (latent, treated) Positive screening in 1994 with treatment. No current symptoms.  Previous treatment reduces reactivation risk. No further action required aside from CXR screenings.       No orders of the defined types were placed in this encounter.   No orders of the defined types were placed in this encounter.   No follow-ups on file.   Rexene Alberts, MSN, NP-C [facility]: [facility]ious [facility]  [email or web address] Pager: [phone] Office: [phone] [facility] Main Line: [phone] *Secure Chat Communication Welcome

## 2023-10-16 NOTE — Patient Instructions (Addendum)
 The abnormal blood test that you were referred here for was the hepatitis C ANTIBODY test. This only shows a memory of a previous infection not an active one.   Your hepatitis C viral level was negative - that means you do not have any active or reactivated hepatitis C levels at all. The treatment you had with Harvoni years ago worked very well.   Talk with your team about possibility of hormone replacement therapy to see if that helps with your night sweats.   Another option to talk with them about is fezolinetant [facility]) -  this is non-hormonal and does very well for night sweats if they are due to hormones.

## 2023-10-25 ENCOUNTER — Emergency Department (HOSPITAL_COMMUNITY)
Admission: EM | Admit: 2023-10-25 | Discharge: 2023-10-25 | Disposition: A | Discharge status: Home / Self Care | Attending: Emergency Medicine | Admitting: Emergency Medicine

## 2023-10-25 ENCOUNTER — Encounter (HOSPITAL_COMMUNITY): Payer: Self-pay | Admitting: Emergency Medicine

## 2023-10-25 DIAGNOSIS — Z7984 Long term (current) use of oral hypoglycemic drugs: Secondary | ICD-10-CM | POA: Insufficient documentation

## 2023-10-25 DIAGNOSIS — R350 Frequency of micturition: Secondary | ICD-10-CM | POA: Diagnosis present

## 2023-10-25 DIAGNOSIS — I10 Essential (primary) hypertension: Secondary | ICD-10-CM | POA: Insufficient documentation

## 2023-10-25 DIAGNOSIS — N3090 Cystitis, unspecified without hematuria: Secondary | ICD-10-CM | POA: Diagnosis not present

## 2023-10-25 DIAGNOSIS — Z79899 Other long term (current) drug therapy: Secondary | ICD-10-CM | POA: Diagnosis not present

## 2023-10-25 DIAGNOSIS — J449 Chronic obstructive pulmonary disease, unspecified: Secondary | ICD-10-CM | POA: Diagnosis not present

## 2023-10-25 DIAGNOSIS — Z87898 Personal history of other specified conditions: Secondary | ICD-10-CM

## 2023-10-25 DIAGNOSIS — N3080 Other cystitis without hematuria: Secondary | ICD-10-CM

## 2023-10-25 LAB — CBC WITH DIFFERENTIAL/PLATELET
Abs Immature Granulocytes: 0.01 10*3/uL (ref 0.00–0.07)
Basophils Absolute: 0 10*3/uL (ref 0.0–0.1)
Basophils Relative: 0 %
Eosinophils Absolute: 0 10*3/uL (ref 0.0–0.5)
Eosinophils Relative: 0 %
HCT: 39.9 % (ref 36.0–46.0)
Hemoglobin: 12.4 g/dL (ref 12.0–15.0)
Immature Granulocytes: 0 %
Lymphocytes Relative: 34 %
Lymphs Abs: 1.6 10*3/uL (ref 0.7–4.0)
MCH: 31 pg (ref 26.0–34.0)
MCHC: 31.1 g/dL (ref 30.0–36.0)
MCV: 99.8 fL (ref 80.0–100.0)
Monocytes Absolute: 0.4 10*3/uL (ref 0.1–1.0)
Monocytes Relative: 9 %
Neutro Abs: 2.7 10*3/uL (ref 1.7–7.7)
Neutrophils Relative %: 57 %
Platelets: 246 10*3/uL (ref 150–400)
RBC: 4 MIL/uL (ref 3.87–5.11)
RDW: 13.6 % (ref 11.5–15.5)
WBC: 4.8 10*3/uL (ref 4.0–10.5)
nRBC: 0 % (ref 0.0–0.2)

## 2023-10-25 LAB — BASIC METABOLIC PANEL WITH GFR
Anion gap: 9 (ref 5–15)
BUN: 14 mg/dL (ref 6–20)
CO2: 23 mmol/L (ref 22–32)
Calcium: 9.3 mg/dL (ref 8.9–10.3)
Chloride: 107 mmol/L (ref 98–111)
Creatinine, Ser: 0.71 mg/dL (ref 0.44–1.00)
GFR, Estimated: >60 mL/min (ref >60)
Glucose, Bld: 99 mg/dL (ref 70–99)
Potassium: 4 mmol/L (ref 3.5–5.1)
Sodium: 139 mmol/L (ref 135–145)

## 2023-10-25 LAB — URINALYSIS, ROUTINE W REFLEX MICROSCOPIC
Bacteria, UA: NONE SEEN
Bilirubin Urine: NEGATIVE
Bilirubin Urine: NEGATIVE
Glucose, UA: NEGATIVE mg/dL
Glucose, UA: NEGATIVE mg/dL
Hgb urine dipstick: NEGATIVE
Hgb urine dipstick: NEGATIVE
Ketones, ur: NEGATIVE mg/dL
Ketones, ur: NEGATIVE mg/dL
Leukocytes,Ua: NEGATIVE
Nitrite: NEGATIVE
Nitrite: NEGATIVE
Protein, ur: NEGATIVE mg/dL
Protein, ur: NEGATIVE mg/dL
Specific Gravity, Urine: 1.019 (ref 1.005–1.030)
Specific Gravity, Urine: 1.016 (ref 1.005–1.030)
pH: 5 (ref 5.0–8.0)
pH: 5 (ref 5.0–8.0)

## 2023-10-25 NOTE — Discharge Instructions (Addendum)
 Thank you for letting us  evaluate you today. Your kidney function is well within normal limits. Urine is negative for blood, infection. Other labs are all within normal limits. Please make sure to follow up with urologist for further management  Return to ED if you have one sided back pain, burning with urination esp with fever

## 2023-10-25 NOTE — ED Triage Notes (Signed)
 Pt reports dark colored urine and urinary frequency. Pt reports she has to self catheterize and has been unable to see her urologist in that last 3 months. Pt reports "I just want to make sure my kidneys are okay."

## 2023-10-25 NOTE — ED Provider Notes (Signed)
 [place] EMERGENCY DEPARTMENT AT [facility] Provider Note   CSN: [id number] Arrival date & time: 10/25/23  5638     History  Chief Complaint  Patient presents with   Urinary Frequency    Brittany Sparks is a 58 y.o. female with past medical history of HTN, COPD, esophagitis, HLD, urinary retention requiring self-catheterization presents emergency department for evaluation of urinary frequency and dark urine that she noticed yesterday.  She reports that she woke up this morning and had to go to the bathroom at 0450 and 0534 which is abnormal for her.  She typically self catheterizes every 6 hours for known urinary retention.  She denies odors, fevers, history of kidney stones, dysuria.  Of note, she follows Dr. Lavona Pounds urology for urinary retention, cystitis cystica.  Last visit was on 06/23/2023 where she was placed on Hiprex 1 g twice daily with vitamin C for UTI prevention.  She is currently still taking this and is compliant.  Next appointment is in June   Urinary Frequency Pertinent negatives include no chest pain, no abdominal pain, no headaches and no shortness of breath.      Home Medications Prior to Admission medications   Medication Sig Start Date End Date Taking? Authorizing Provider  albuterol  (VENTOLIN  HFA) 108 (90 Base) MCG/ACT inhaler Inhale 1-2 puffs into the lungs every 6 (six) hours as needed. 10/15/21   [provider]  amLODipine  (NORVASC ) 5 MG tablet Take 5 mg by mouth daily.    [provider]  ascorbic acid (VITAMIN C) 100 MG tablet Take by mouth.    [provider]  BREZTRI  AEROSPHERE 160-9-4.8 MCG/ACT AERO inhaler INAHLE 2 PUFFS INTO THE LUNGS IN THE MORNING AND AT BEDTIME 11/21/22   [provider]  Catheters (SILASTIC FOLEY CATHETER) MISC Catheters for home self cath up to 4 times daily. 09/21/22   Coretha Dew, PA-C  famotidine  (PEPCID ) 20 MG tablet One at bedtime Patient taking differently: Take 20 mg  by mouth at bedtime. One at bedtime 02/24/18   Wert, Michael B, MD  ferrous sulfate 325 (65 FE) MG EC tablet Take 325 mg by mouth daily with breakfast.    [provider]  Fluticasone -Umeclidin-Vilant (TRELEGY ELLIPTA ) 100-62.5-25 MCG/ACT AEPB Inhale 1 puff into the lungs daily. Patient not taking: Reported on 10/16/2023 02/18/23   Hunsucker, Archer Kobs, MD  gabapentin  (NEURONTIN ) 100 MG capsule TAKE 1 CAPSULE(100 MG) BY MOUTH TWICE DAILY 02/12/21   Diamond Formica, MD  HYDROcodone  bit-homatropine (HYDROMET) 5-1.5 MG/5ML syrup Take 5 mLs by mouth every 6 (six) hours as needed for cough. 11/21/22   Parrett, Macdonald Savoy, NP  ID NOW COVID-19 KIT See admin instructions. Patient not taking: Reported on 11/21/2022 12/01/19   [provider]  lidocaine  (LIDODERM ) 5 % Place 1 patch onto the skin daily. Remove & Discard patch within 12 hours or as directed by MD 02/21/23   Geiple, Joshua, PA-C  metFORMIN (GLUCOPHAGE-XR) 500 MG 24 hr tablet TAKE ONE TABLET BY MOUTH IN THE EVENING WITH A MEAL 10/09/23   [provider]  methocarbamol  (ROBAXIN ) 500 MG tablet Take 1 tablet (500 mg total) by mouth 2 (two) times daily. 02/21/23   Geiple, Joshua, PA-C  olmesartan  (BENICAR ) 40 MG tablet Take 40 mg by mouth daily.    [provider]  pantoprazole  (PROTONIX ) 40 MG tablet TAKE 1 TABLET(40 MG) BY MOUTH DAILY 30 TO 60 MINUTES BEFORE FIRST MEAL OF THE DAY 10/06/18   Diamond Formica,  MD  predniSONE  (DELTASONE ) 20 MG tablet Take 2 tablets (40 mg total) by mouth daily. Patient not taking: Reported on 10/16/2023 02/21/23   Lyna Sandhoff, PA-C  simvastatin  (ZOCOR ) 10 MG tablet Take 10 mg by mouth at bedtime.    [provider]  valACYclovir  (VALTREX ) 1000 MG tablet Take 1,000 mg by mouth 2 (two) times daily.    [provider]  Vitamin D, Ergocalciferol, (DRISDOL) 1.25 MG (50000 UNIT) CAPS capsule Take 1 capsule by mouth once a week. 09/02/23   [provider]      Allergies     Patient has no known allergies.    Review of Systems   Review of Systems  Constitutional:  Negative for chills, fatigue and fever.  Respiratory:  Negative for cough, chest tightness, shortness of breath and wheezing.   Cardiovascular:  Negative for chest pain and palpitations.  Gastrointestinal:  Negative for abdominal pain, constipation, diarrhea, nausea and vomiting.  Genitourinary:  Positive for frequency.  Neurological:  Negative for dizziness, seizures, weakness, light-headedness, numbness and headaches.    Physical Exam Updated Vital Signs BP 100/75   Pulse 60   Temp 98.7 F (37.1 C) (Oral)   Resp 16   SpO2 98%  Physical Exam Vitals and nursing note reviewed.  Constitutional:      General: She is not in acute distress.    Appearance: Normal appearance.  HENT:     Head: Normocephalic and atraumatic.  Eyes:     Conjunctiva/sclera: Conjunctivae normal.  Cardiovascular:     Rate and Rhythm: Normal rate.     Heart sounds: Normal heart sounds.  Pulmonary:     Effort: Pulmonary effort is normal. No respiratory distress.     Breath sounds: Normal breath sounds.  Chest:     Chest wall: No tenderness.  Abdominal:     General: Bowel sounds are normal. There is no distension.     Palpations: Abdomen is soft.     Tenderness: There is no abdominal tenderness. There is no right CVA tenderness, left CVA tenderness, guarding or rebound.  Skin:    General: Skin is warm.     Coloration: Skin is not jaundiced or pale.  Neurological:     Mental Status: She is alert and oriented to person, place, and time. Mental status is at baseline.     ED Results / Procedures / Treatments   Labs (all labs ordered are listed, but only abnormal results are displayed) Labs Reviewed  URINALYSIS, ROUTINE W REFLEX MICROSCOPIC - Abnormal; Notable for the following components:      Result Value   APPearance HAZY (*)    Leukocytes,Ua MODERATE (*)    All other components within normal limits   URINE CULTURE  CBC WITH DIFFERENTIAL/PLATELET  BASIC METABOLIC PANEL WITH GFR  URINALYSIS, ROUTINE W REFLEX MICROSCOPIC    EKG None  Radiology No results found.  Procedures Procedures    Medications Ordered in ED Medications - No data to display  ED Course/ Medical Decision Making/ A&P                                 Medical Decision Making Amount and/or Complexity of Data Reviewed Labs: ordered.   Patient presents to the ED for concern of urinary frequency, dark urine, this involves an extensive number of treatment options, and is a complaint that carries with it a high risk of complications and morbidity.  The  differential diagnosis includes UTI, pyelonephritis, nephrolithiasis, dehydration, AKI   Co morbidities that complicate the patient evaluation  Status cystica Chronic urinary retention requiring self cath   Additional history obtained:  Additional history obtained from Nursing and Outside Medical Records   External records from outside source obtained and reviewed including triage RN note, urology note from 12/24   Lab Tests:  I Ordered, and personally interpreted labs.  The pertinent results include:   No leukocytosis Second UA was negative for blood and infection Culture pending     Problem List / ED Course:  Urinary frequency Cystitis cystica History of urinary retention No leukocytosis.  Initial urine was contaminated.  Obtained catheterized urine that has no hemoglobin nor infection.  Creatinine WNL.  Culture pending No pain to low suspicion of pyelonephritis, nephrolithiasis, infected kidney stone Vital signs WNL with no tachycardia nor fever   Reevaluation:  After the interventions noted above, I reevaluated the patient and found that they have :stayed the same    Dispostion:  After consideration of the diagnostic results and the patients response to treatment, I feel that the patent would benefit from outpatient management with  urology.   Discussed ED workup, disposition, return to ED precautions with patient who expresses understanding agrees with plan.  All questions answered to their satisfaction.  They are agreeable to plan.  Discharge instructions provided on paperwork  Final Clinical Impression(s) / ED Diagnoses Final diagnoses:  Urinary frequency  Cystitis cystica  History of urinary retention    Rx / DC Orders ED Discharge Orders     None         Royann Cords, PA 10/25/23 Rainey Burden    Auston Blush, MD 10/27/23 1259

## 2023-10-26 LAB — URINE CULTURE: Culture: NO GROWTH

## 2024-02-01 ENCOUNTER — Emergency Department (HOSPITAL_COMMUNITY)
Admission: EM | Admit: 2024-02-01 | Discharge: 2024-02-01 | Disposition: A | Discharge status: Against Medical Advice | Attending: Emergency Medicine | Admitting: Emergency Medicine

## 2024-02-01 DIAGNOSIS — Z5321 Procedure and treatment not carried out due to patient leaving prior to being seen by health care provider: Secondary | ICD-10-CM | POA: Insufficient documentation

## 2024-02-01 DIAGNOSIS — R109 Unspecified abdominal pain: Secondary | ICD-10-CM | POA: Insufficient documentation

## 2024-02-01 NOTE — ED Triage Notes (Signed)
 Arrived POV from home. Patient reports abdominal pain 6/10, feeling of fullness. Patient states she thinks she may have H. Pylori again. Patient states symptoms started Saturday.

## 2024-02-02 ENCOUNTER — Encounter (HOSPITAL_COMMUNITY): Payer: Self-pay

## 2024-02-02 ENCOUNTER — Emergency Department (HOSPITAL_COMMUNITY)
Admission: EM | Admit: 2024-02-02 | Discharge: 2024-02-02 | Disposition: A | Discharge status: Home / Self Care | Attending: Emergency Medicine | Admitting: Emergency Medicine

## 2024-02-02 ENCOUNTER — Emergency Department (HOSPITAL_COMMUNITY)

## 2024-02-02 DIAGNOSIS — R1084 Generalized abdominal pain: Secondary | ICD-10-CM | POA: Diagnosis present

## 2024-02-02 DIAGNOSIS — R7401 Elevation of levels of liver transaminase levels: Secondary | ICD-10-CM | POA: Diagnosis not present

## 2024-02-02 DIAGNOSIS — I10 Essential (primary) hypertension: Secondary | ICD-10-CM | POA: Diagnosis not present

## 2024-02-02 DIAGNOSIS — Z79899 Other long term (current) drug therapy: Secondary | ICD-10-CM | POA: Insufficient documentation

## 2024-02-02 DIAGNOSIS — J45909 Unspecified asthma, uncomplicated: Secondary | ICD-10-CM | POA: Diagnosis not present

## 2024-02-02 DIAGNOSIS — D7589 Other specified diseases of blood and blood-forming organs: Secondary | ICD-10-CM | POA: Diagnosis not present

## 2024-02-02 DIAGNOSIS — R739 Hyperglycemia, unspecified: Secondary | ICD-10-CM

## 2024-02-02 LAB — COMPREHENSIVE METABOLIC PANEL WITH GFR
ALT: 321 U/L — ABNORMAL HIGH (ref 0–44)
AST: 745 U/L — ABNORMAL HIGH (ref 15–41)
Albumin: 3.7 g/dL (ref 3.5–5.0)
Alkaline Phosphatase: 78 U/L (ref 38–126)
Anion gap: 10 (ref 5–15)
BUN: 18 mg/dL (ref 6–20)
CO2: 26 mmol/L (ref 22–32)
Calcium: 9.1 mg/dL (ref 8.9–10.3)
Chloride: 103 mmol/L (ref 98–111)
Creatinine, Ser: 1.02 mg/dL — ABNORMAL HIGH (ref 0.44–1.00)
GFR, Estimated: >60 mL/min (ref >60)
Glucose, Bld: 106 mg/dL — ABNORMAL HIGH (ref 70–99)
Potassium: 4.2 mmol/L (ref 3.5–5.1)
Sodium: 139 mmol/L (ref 135–145)
Total Bilirubin: 1 mg/dL (ref 0.0–1.2)
Total Protein: 7.3 g/dL (ref 6.5–8.1)

## 2024-02-02 LAB — HEPATITIS PANEL, ACUTE
HCV Ab: REACTIVE — AB
Hep A IgM: NONREACTIVE
Hep B C IgM: NONREACTIVE
Hepatitis B Surface Ag: NONREACTIVE

## 2024-02-02 LAB — CBC
HCT: 38.3 % (ref 36.0–46.0)
Hemoglobin: 11.9 g/dL — ABNORMAL LOW (ref 12.0–15.0)
MCH: 31.5 pg (ref 26.0–34.0)
MCHC: 31.1 g/dL (ref 30.0–36.0)
MCV: 101.3 fL — ABNORMAL HIGH (ref 80.0–100.0)
Platelets: 304 K/uL (ref 150–400)
RBC: 3.78 MIL/uL — ABNORMAL LOW (ref 3.87–5.11)
RDW: 13 % (ref 11.5–15.5)
WBC: 8.3 K/uL (ref 4.0–10.5)
nRBC: 0 % (ref 0.0–0.2)

## 2024-02-02 LAB — URINALYSIS, ROUTINE W REFLEX MICROSCOPIC
Bacteria, UA: NONE SEEN
Bilirubin Urine: NEGATIVE
Glucose, UA: NEGATIVE mg/dL
Hgb urine dipstick: NEGATIVE
Ketones, ur: NEGATIVE mg/dL
Nitrite: NEGATIVE
Protein, ur: NEGATIVE mg/dL
Specific Gravity, Urine: 1.02 (ref 1.005–1.030)
pH: 7 (ref 5.0–8.0)

## 2024-02-02 LAB — LIPASE, BLOOD: Lipase: 39 U/L (ref 11–51)

## 2024-02-02 MED ORDER — MORPHINE SULFATE (PF) 4 MG/ML IV SOLN
4.0000 mg | Freq: Once | INTRAVENOUS | Status: AC
  Administered 2024-02-02: 4 mg via INTRAVENOUS
  Filled 2024-02-02: qty 1

## 2024-02-02 MED ORDER — SODIUM CHLORIDE 0.9 % IV BOLUS: 1000.0000 mL | Freq: Once | INTRAVENOUS | Status: DC

## 2024-02-02 MED ORDER — ONDANSETRON HCL 4 MG/2ML IJ SOLN
4.0000 mg | Freq: Once | INTRAMUSCULAR | Status: AC
  Administered 2024-02-02: 4 mg via INTRAVENOUS
  Filled 2024-02-02: qty 2

## 2024-02-02 MED ORDER — DICYCLOMINE HCL 20 MG PO TABS: 20.0000 mg | ORAL_TABLET | Freq: Two times a day (BID) | ORAL | 0 refills | Status: AC

## 2024-02-02 MED ORDER — IOHEXOL 300 MG/ML  SOLN
100.0000 mL | Freq: Once | INTRAMUSCULAR | Status: AC | PRN
  Administered 2024-02-02: 100 mL via INTRAVENOUS

## 2024-02-02 MED ORDER — PANTOPRAZOLE SODIUM 40 MG PO TBEC: 40.0000 mg | DELAYED_RELEASE_TABLET | Freq: Every day | ORAL | 0 refills | Status: AC

## 2024-02-02 NOTE — ED Triage Notes (Signed)
 Patient arrived with complaints of generalized abdominal cramping and fullness since Saturday. Reports history of H. Pylori and states it feels the same.

## 2024-02-02 NOTE — ED Provider Notes (Signed)
  Provider Note MRN:  [id number]  Arrival date & time: 02/02/24    ED Course and Medical Decision Making  Assumed care from Dr Raford at shift change.  See note from prior team for complete details, in brief:  Clinical Course as of 02/02/24 0850  Tue Feb 02, 2024  0706 Handoff DG S/p cholecystectomy, epig pain LFT's elev, tbili wnl, no jaundice/fever CT stable Pending U/S O/w feeling better [SG]  0741 U/s stable [SG]    Clinical Course User Index [SG] Elnor Savant A, DO   She is feeling better, she is tolerating p.o. without difficulty.  No fever or jaundice.  LFTs are elevated but imaging is reassuring.  Encourage follow-up with GI, she f/w gi @ [facility]   Hx hcv but no rpt exposure, no jaundice /fever  Patient in no distress and overall condition is stable. Detailed discussions were had with the patient/guardian regarding current findings, and need for close f/u with PCP or on call doctor. The patient/guardian has been instructed to return immediately if the symptoms worsen in any way for re-evaluation. Patient/guardian verbalized understanding and is in agreement with current care plan. All questions answered prior to discharge.     Procedures  Final Clinical Impressions(s) / ED Diagnoses     ICD-10-CM   1. Abdominal pain, generalized  R10.84     2. Elevated transaminase level  R74.01     3. Elevated random blood glucose level  R73.9     4. Macrocytosis without anemia  D75.89       ED Discharge Orders     None         Discharge Instructions      It was a pleasure caring for you today in the emergency department.  Your liver enzymes were elevated.  Please follow-up with gastroenterology.  Your imaging here was stable.  Workup here otherwise is stable. Please avoid alcohol/tobacco.   Please return to the emergency department for any worsening or worrisome symptoms.          Elnor Savant LABOR, DO 02/02/24 [phone]

## 2024-02-02 NOTE — Discharge Instructions (Addendum)
 It was a pleasure caring for you today in the emergency department.  Your liver enzymes were elevated.  Please follow-up with gastroenterology.  Your imaging here was stable.  Workup here otherwise is stable. Please avoid alcohol/tobacco.   Please return to the emergency department for any worsening or worrisome symptoms.

## 2024-02-02 NOTE — ED Provider Notes (Signed)
 [place] EMERGENCY DEPARTMENT AT [facility] Provider Note   CSN: [id number] Arrival date & time: 02/02/24  9884     Patient presents with: Abdominal Pain   Brittany Sparks is a 58 y.o. female.   The history is provided by the patient.  Abdominal Pain  She has history of hypertension, hyperlipidemia, hepatitis C, asthma, GERD, H. pylori infection, small bowel obstruction and comes in because of generalized abdominal pain reported last 3 days.  There is associated nausea but no vomiting.  She denies constipation or diarrhea.  She denies fever or chills.  She states symptoms are similar to what she had with H. pylori in the past.    Prior to Admission medications   Medication Sig Start Date End Date Taking? Authorizing Provider  albuterol  (VENTOLIN  HFA) 108 (90 Base) MCG/ACT inhaler Inhale 1-2 puffs into the lungs every 6 (six) hours as needed. 10/15/21   [provider]  amLODipine  (NORVASC ) 5 MG tablet Take 5 mg by mouth daily.    [provider]  ascorbic acid (VITAMIN C) 100 MG tablet Take by mouth.    [provider]  BREZTRI  AEROSPHERE 160-9-4.8 MCG/ACT AERO inhaler INAHLE 2 PUFFS INTO THE LUNGS IN THE MORNING AND AT BEDTIME 11/21/22   [provider]  Catheters (SILASTIC FOLEY CATHETER) MISC Catheters for home self cath up to 4 times daily. 09/21/22   Jarold Olam HERO, PA-C  famotidine  (PEPCID ) 20 MG tablet One at bedtime Patient taking differently: Take 20 mg by mouth at bedtime. One at bedtime 02/24/18   Wert, Michael B, MD  ferrous sulfate 325 (65 FE) MG EC tablet Take 325 mg by mouth daily with breakfast.    [provider]  Fluticasone -Umeclidin-Vilant (TRELEGY ELLIPTA ) 100-62.5-25 MCG/ACT AEPB Inhale 1 puff into the lungs daily. Patient not taking: Reported on 10/16/2023 02/18/23   Hunsucker, Donnice SAUNDERS, MD  gabapentin  (NEURONTIN ) 100 MG capsule TAKE 1 CAPSULE(100 MG) BY MOUTH TWICE DAILY 02/12/21   Darlean Ozell NOVAK, MD   HYDROcodone  bit-homatropine (HYDROMET) 5-1.5 MG/5ML syrup Take 5 mLs by mouth every 6 (six) hours as needed for cough. 11/21/22   Parrett, Madelin RAMAN, NP  ID NOW COVID-19 KIT See admin instructions. Patient not taking: Reported on 11/21/2022 12/01/19   [provider]  lidocaine  (LIDODERM ) 5 % Place 1 patch onto the skin daily. Remove & Discard patch within 12 hours or as directed by MD 02/21/23   Geiple, Joshua, PA-C  metFORMIN (GLUCOPHAGE-XR) 500 MG 24 hr tablet TAKE ONE TABLET BY MOUTH IN THE EVENING WITH A MEAL 10/09/23   [provider]  methocarbamol  (ROBAXIN ) 500 MG tablet Take 1 tablet (500 mg total) by mouth 2 (two) times daily. 02/21/23   Geiple, Joshua, PA-C  olmesartan  (BENICAR ) 40 MG tablet Take 40 mg by mouth daily.    [provider]  pantoprazole  (PROTONIX ) 40 MG tablet TAKE 1 TABLET(40 MG) BY MOUTH DAILY 30 TO 60 MINUTES BEFORE FIRST MEAL OF THE DAY 10/06/18   Darlean Ozell NOVAK, MD  predniSONE  (DELTASONE ) 20 MG tablet Take 2 tablets (40 mg total) by mouth daily. Patient not taking: Reported on 10/16/2023 02/21/23   Desiderio Chew, PA-C  simvastatin  (ZOCOR ) 10 MG tablet Take 10 mg by mouth at bedtime.    [provider]  valACYclovir  (VALTREX ) 1000 MG tablet Take 1,000 mg by mouth 2 (two) times daily.    [provider]  Vitamin D, Ergocalciferol, (DRISDOL) 1.25 MG (50000 UNIT) CAPS capsule Take 1 capsule  by mouth once a week. 09/02/23   [provider]    Allergies: Patient has no known allergies.    Review of Systems  Gastrointestinal:  Positive for abdominal pain.  All other systems reviewed and are negative.   Updated Vital Signs BP 119/84   Pulse 66   Temp 98.3 F (36.8 C)   Resp 17   SpO2 97%   Physical Exam Vitals and nursing note reviewed.   58 year old female, resting comfortably and in no acute distress. Vital signs are normal. Oxygen saturation is 97%, which is normal. Head is normocephalic and atraumatic. PERRLA,  EOMI.  Lungs are clear without rales, wheezes, or rhonchi. Chest is nontender. Heart has regular rate and rhythm without murmur. Abdomen is soft, flat, with diffuse tenderness.  There is no rebound or guarding. Extremities have no cyanosis or edema, full range of motion is present. Skin is warm and dry without rash. Neurologic: Awake and alert, moves all extremities equally.  (all labs ordered are listed, but only abnormal results are displayed) Labs Reviewed  COMPREHENSIVE METABOLIC PANEL WITH GFR - Abnormal; Notable for the following components:      Result Value   Glucose, Bld 106 (*)    Creatinine, Ser 1.02 (*)    AST 745 (*)    ALT 321 (*)    All other components within normal limits  CBC - Abnormal; Notable for the following components:   RBC 3.78 (*)    Hemoglobin 11.9 (*)    MCV 101.3 (*)    All other components within normal limits  URINALYSIS, ROUTINE W REFLEX MICROSCOPIC - Abnormal; Notable for the following components:   APPearance HAZY (*)    Leukocytes,Ua TRACE (*)    All other components within normal limits  LIPASE, BLOOD    EKG: None  Radiology: No results found.   Procedures   Medications Ordered in the ED - No data to display  Clinical Course as of 02/02/24 0713  Tue Feb 02, 2024  0706 Handoff DG  Pending U/S O/w feeling better [SG]    Clinical Course User Index [SG] Elnor Jayson LABOR, DO                                 Medical Decision Making Amount and/or Complexity of Data Reviewed Labs: ordered. Radiology: ordered.  Risk Prescription drug management.   Generalized abdominal pain.  This is a presentation with a wide range of treatment options and carries with it a high risk of morbidity and complications.  Differential diagnosis includes, but is not limited to, diverticulitis, bowel obstruction, urolithiasis, pyelonephritis, cholecystitis, pancreatitis, abdominal aortic aneurysm.  I have reviewed her past records, and do note several CT  scans of abdomen and pelvis over the last 10 years all of which showed no acute process-most recently on 04/11/2020.  I have reviewed her laboratory tests, and my interpretation is borderline elevated creatinine which is not felt to be clinically significant, elevated random glucose level which will need to be followed as an outpatient, significantly elevated AST and ALT with normal bilirubin and alkaline phosphatase which is new for her, normal WBC, mild anemia with hemoglobin not significantly changed from baseline, normal urinalysis.  I have ordered CT scan of abdomen and pelvis to look for evidence of bowel obstruction as well as abnormalities of the liver.  I have ordered morphine  for pain and ondansetron  for nausea.  She is  feeling much better following above-noted treatment.  CT scan shows dilated common bile duct which is probably because of postcholecystectomy status but in the setting of transaminitis, I have ordered ultrasound to better characterize this.  I have also ordered today an acute hepatitis panel.  Case is signed out to Dr. Elnor, oncoming physician.     Final diagnoses:  Abdominal pain, generalized  Elevated transaminase level  Elevated random blood glucose level  Macrocytosis without anemia    ED Discharge Orders     None          Raford Lenis, MD 02/02/24 [id number]
